# Patient Record
Sex: Male | Born: 1950
Health system: Southern US, Community
[De-identification: ages and names within clinical notes are randomized; demographics above are authoritative.]

## PROBLEM LIST (undated history)

## (undated) DIAGNOSIS — I639 Cerebral infarction, unspecified: Secondary | ICD-10-CM

## (undated) DIAGNOSIS — I10 Essential (primary) hypertension: Secondary | ICD-10-CM

## (undated) DIAGNOSIS — M1990 Unspecified osteoarthritis, unspecified site: Secondary | ICD-10-CM

## (undated) DIAGNOSIS — E785 Hyperlipidemia, unspecified: Secondary | ICD-10-CM

## (undated) HISTORY — DX: Unspecified osteoarthritis, unspecified site: M19.90

## (undated) HISTORY — DX: Cerebral infarction, unspecified: I63.9

## (undated) HISTORY — DX: Essential (primary) hypertension: I10

---

## 1974-03-31 HISTORY — PX: KNEE SURGERY: SHX244

## 1978-05-01 HISTORY — PX: KNEE SURGERY: SHX244

## 1994-03-31 DIAGNOSIS — G459 Transient cerebral ischemic attack, unspecified: Secondary | ICD-10-CM

## 1994-03-31 DIAGNOSIS — I639 Cerebral infarction, unspecified: Secondary | ICD-10-CM

## 1994-03-31 HISTORY — DX: Transient cerebral ischemic attack, unspecified: G45.9

## 1994-03-31 HISTORY — DX: Cerebral infarction, unspecified: I63.9

## 1994-03-31 HISTORY — PX: REPLACEMENT TOTAL KNEE: SUR1224

## 2006-01-19 ENCOUNTER — Emergency Department (HOSPITAL_COMMUNITY): Admission: EM | Admit: 2006-01-19 | Discharge: 2006-01-19 | Payer: Self-pay | Admitting: Emergency Medicine

## 2012-11-10 ENCOUNTER — Ambulatory Visit (INDEPENDENT_AMBULATORY_CARE_PROVIDER_SITE_OTHER): Payer: Medicare HMO | Admitting: Cardiovascular Disease

## 2012-11-10 ENCOUNTER — Encounter: Payer: Self-pay | Admitting: Cardiovascular Disease

## 2012-11-10 VITALS — BP 148/80 | HR 80 | Resp 16 | Ht 70.0 in | Wt 216.4 lb

## 2012-11-10 DIAGNOSIS — E785 Hyperlipidemia, unspecified: Secondary | ICD-10-CM

## 2012-11-10 DIAGNOSIS — I10 Essential (primary) hypertension: Secondary | ICD-10-CM

## 2012-11-10 DIAGNOSIS — M109 Gout, unspecified: Secondary | ICD-10-CM

## 2012-11-10 DIAGNOSIS — Z01810 Encounter for preprocedural cardiovascular examination: Secondary | ICD-10-CM

## 2012-11-10 MED ORDER — AMLODIPINE BESYLATE 5 MG PO TABS: 5.0000 mg | ORAL_TABLET | Freq: Every day | ORAL | Status: DC

## 2012-11-10 NOTE — Patient Instructions (Addendum)
START AMLODIPINE 5MG  ONE TABLET DAILY.  CONTINUE CURRENT MEDICATIONS.  Your physician recommends that you schedule a follow-up appointment in: 2 WEEKS WITH PA.

## 2012-11-21 ENCOUNTER — Encounter: Payer: Self-pay | Admitting: Cardiovascular Disease

## 2012-11-21 DIAGNOSIS — I10 Essential (primary) hypertension: Secondary | ICD-10-CM | POA: Insufficient documentation

## 2012-11-21 DIAGNOSIS — E785 Hyperlipidemia, unspecified: Secondary | ICD-10-CM | POA: Insufficient documentation

## 2012-11-21 DIAGNOSIS — Z01810 Encounter for preprocedural cardiovascular examination: Secondary | ICD-10-CM | POA: Insufficient documentation

## 2012-11-21 DIAGNOSIS — M109 Gout, unspecified: Secondary | ICD-10-CM | POA: Insufficient documentation

## 2012-11-21 NOTE — Assessment & Plan Note (Signed)
On statin therapy. We'll try to get his most recent lipid profile report.

## 2012-11-21 NOTE — Assessment & Plan Note (Signed)
Doubling his losartan/hydrochlorothiazide is unlikely to be in any way beneficial. He is on the maximum effective dose of losartan and increasing his diuretic may be responsible for triggering attacks of gout. I think it'll make more sense to add a separate agent and amlodipine appears to be particularly appropriate. When I mentioned this drug's name he recognized it and stated that he did well when he took it once before, while living in the [place]. We will start a dose of 5 mg once a day and reevaluate his blood pressure in a few weeks.

## 2012-11-21 NOTE — Assessment & Plan Note (Addendum)
The patient's overall cardiovascular risk with the planned knee replacement surgery appears to be low but his blood pressure needs further treatment. It is likely that he has at least some degree of hypertensive cardiomyopathy since his blood pressure has been inconsistently treated over the last 40 years or so.

## 2012-11-21 NOTE — Progress Notes (Signed)
Patient ID: Alexander Gonzalez, male   DOB: 12-Nov-1950, 62 y.o.   MRN: [id number]     Reason for office visit Alexander Gonzalez has insufficiently controlled high blood pressure and is referred in consultation by Dr.Olin, prior to planned total right knee replacement.  He was initially diagnosed with high blood pressure when he was only 62 years old and was interested in playing sports. His compliance with antihypertensive medication has been very spotty. It sounds likely that over the last 40 years his blood pressure has generally not been in the desirable range. He does not have any cardiovascular complaints. He specifically denies syncope, edema, shortness of breath, chest pain, palpitations, intermittent claudication, focal cortical deficits or erectile dysfunction.    No Known Allergies  Current Outpatient Prescriptions  Medication Sig Dispense Refill  . amLODipine (NORVASC) 5 MG tablet Take 1 tablet (5 mg total) by mouth daily.  30 tablet  3  . COLCRYS 0.6 MG tablet Take 0.6 mg by mouth daily as needed.      . indomethacin (INDOCIN) 50 MG capsule Take 50 mg by mouth as needed.      Marland Kitchen losartan-hydrochlorothiazide (HYZAAR) 100-25 MG per tablet Take 1 tablet by mouth daily.      . simvastatin (ZOCOR) 40 MG tablet Take 40 mg by mouth every evening.       No current facility-administered medications for this visit.    Past Medical History  Diagnosis Date  . Hypertension   . Osteoarthritis   . Mini stroke 1996    Past Surgical History  Procedure Laterality Date  . Knee surgery  1976    right  . Replacement total knee  1996    left  . Knee surgery  1980'2    right    Family History  Problem Relation Age of Onset  . Cancer Mother   . Heart attack Father     History   Social History  . Marital Status: Divorced    Spouse Name: N/A    Number of Children: N/A  . Years of Education: N/A   Occupational History  . Not on file.   Social History Main Topics  . Smoking  status: Never Smoker   . Smokeless tobacco: Not on file  . Alcohol Use: Yes     Comment: occas.  . Drug Use: No  . Sexual Activity: Not on file   Other Topics Concern  . Not on file   Social History Narrative  . No narrative on file    Review of systems: The patient specifically denies any chest pain at rest or with exertion, dyspnea at rest or with exertion, orthopnea, paroxysmal nocturnal dyspnea, syncope, palpitations, focal neurological deficits, intermittent claudication, lower extremity edema, unexplained weight gain, cough, hemoptysis or wheezing.  The patient also denies abdominal pain, nausea, vomiting, dysphagia, diarrhea, constipation, polyuria, polydipsia, dysuria, hematuria, frequency, urgency, abnormal bleeding or bruising, fever, chills, unexpected weight changes, mood swings, change in skin or hair texture, change in voice quality, auditory or visual problems, allergic reactions or rashes, he has lifestyle limiting pain and instability in his right knee  PHYSICAL EXAM BP 148/80  Pulse 80  Resp 16  Ht 5\' 10"  (1.778 m)  Wt 216 lb 6.4 oz (98.158 kg)  BMI 31.05 kg/m2  General: Alert, oriented x3, no distress Head: no evidence of trauma, PERRL, EOMI, no exophtalmos or lid lag, no myxedema, no xanthelasma; normal ears, nose and oropharynx Neck: normal jugular venous pulsations and no hepatojugular reflux; brisk  carotid pulses without delay and no carotid bruits Chest: clear to auscultation, no signs of consolidation by percussion or palpation, normal fremitus, symmetrical and full respiratory excursions Cardiovascular: normal position and quality of the apical impulse, regular rhythm, normal first and second heart sounds, no murmurs, rubs or gallops Abdomen: no tenderness or distention, no masses by palpation, no abnormal pulsatility or arterial bruits, normal bowel sounds, no hepatosplenomegaly Extremities: no clubbing, cyanosis or edema; 2+ radial, ulnar and brachial  pulses bilaterally; 2+ right femoral, posterior tibial and dorsalis pedis pulses; 2+ left femoral, posterior tibial and dorsalis pedis pulses; no subclavian or femoral bruits Neurological: grossly nonfocal   EKG: Sinus rhythm without signs of left ventricular hypertrophy nonspecific T-wave flattening/slight inversion in leads 1 and aVL  Lipid Panel  No results found for this basename: chol, trig, hdl, cholhdl, vldl, ldlcalc    BMET No results found for this basename: na, k, cl, co2, glucose, bun, creatinine, calcium, gfrnonaa, gfraa     ASSESSMENT AND PLAN Preop cardiovascular exam The patient's overall cardiovascular risk with the planned knee replacement surgery appears to be low but his blood pressure needs further treatment. It is likely that he has at least some degree of hypertensive cardiomyopathy since his blood pressure has been inconsistently treated over the last 40 years or so.   HTN (hypertension) Doubling his losartan/hydrochlorothiazide is unlikely to be in any way beneficial. He is on the maximum effective dose of losartan and increasing his diuretic may be responsible for triggering attacks of gout. I think it'll make more sense to add a separate agent and amlodipine appears to be particularly appropriate. When I mentioned this drug's name he recognized it and stated that he did well when he took it once before, while living in the [place]. We will start a dose of 5 mg once a day and reevaluate his blood pressure in a few weeks.   Hyperlipidemia On statin therapy. We'll try to get his most recent lipid profile report.  it will also be helpful to get his most recent renal function panel and electrolytes Orders Placed This Encounter  Procedures  . EKG 12-Lead   Meds ordered this encounter  Medications  . simvastatin (ZOCOR) 40 MG tablet    Sig: Take 40 mg by mouth every evening.  Marland Kitchen DISCONTD: losartan (COZAAR) 25 MG tablet    Sig: Take 25 mg by mouth daily.  Marland Kitchen  losartan-hydrochlorothiazide (HYZAAR) 100-25 MG per tablet    Sig: Take 1 tablet by mouth daily.  . indomethacin (INDOCIN) 50 MG capsule    Sig: Take 50 mg by mouth as needed.  . COLCRYS 0.6 MG tablet    Sig: Take 0.6 mg by mouth daily as needed.  Marland Kitchen amLODipine (NORVASC) 5 MG tablet    Sig: Take 1 tablet (5 mg total) by mouth daily.    Dispense:  30 tablet    Refill:  3    Deanna Boehlke  Thurmon Fair, MD, [facility] [phone] office [phone] pager

## 2012-11-24 ENCOUNTER — Ambulatory Visit (INDEPENDENT_AMBULATORY_CARE_PROVIDER_SITE_OTHER): Payer: Medicare HMO | Admitting: Cardiology

## 2012-11-24 ENCOUNTER — Encounter: Payer: Self-pay | Admitting: Cardiology

## 2012-11-24 VITALS — BP 150/80 | HR 80 | Ht 76.0 in | Wt 271.0 lb

## 2012-11-24 DIAGNOSIS — E785 Hyperlipidemia, unspecified: Secondary | ICD-10-CM

## 2012-11-24 DIAGNOSIS — I10 Essential (primary) hypertension: Secondary | ICD-10-CM

## 2012-11-24 MED ORDER — AMLODIPINE BESYLATE 10 MG PO TABS: 10.0000 mg | ORAL_TABLET | Freq: Every day | ORAL | Status: AC

## 2012-11-24 MED ORDER — SIMVASTATIN 40 MG PO TABS: 20.0000 mg | ORAL_TABLET | Freq: Every evening | ORAL | Status: AC

## 2012-11-24 NOTE — Assessment & Plan Note (Signed)
Not well controlled but slowly coming down.  Will increase norvasc to 10 mg daily, discussed with Dr. Royann Shivers , ok to schedule total knee in 3-4 weeks.  We will see back in 2 weeks to make sure BP stable.

## 2012-11-24 NOTE — Patient Instructions (Addendum)
Increase Norvasc to 10 mg daily, you can take 2 of 5 mg until bottle gone then use 10 mg tabs.  We decreased you zocor to 20 mg daily, due to interaction with norvasc.  Follow up with Dr. Royann Shivers or me- on a day Dr. Royann Shivers is here  in 2 weeks  Schedule your surgery for 3-4 weeks.

## 2012-11-24 NOTE — Progress Notes (Signed)
Patient ID: Alexander Gonzalez, male   DOB: [birth year], 61 y.o.   MRN: [id number]   11/24/2012   PCP: Alexander Garibaldi, NP   Chief Complaint  Patient presents with  . Hypertension    Primary Cardiologist: Alexander Gonzalez  HPI:  Alexander Gonzalez, 62 yr old male, has insufficiently controlled high blood pressure and was referred in consultation by Alexander Gonzalez, prior to planned total right knee replacement.    He was initially diagnosed with high blood pressure when he was only 62 years old and was interested in playing sports. His compliance with antihypertensive medication has been very spotty. It sounds likely that over the last 40 years his blood pressure has generally not been in the desirable range. He does not have any cardiovascular complaints. He specifically denies syncope, edema, shortness of breath, chest pain, palpitations, intermittent claudication, focal cortical deficits or erectile dysfunction.   He was seen by Alexander Gonzalez and norvasc added and today he is here for follow up.  No other complaints, but frustrated at having to wait for surgery.   No Known Allergies  Current Outpatient Prescriptions  Medication Sig Dispense Refill  . amLODipine (NORVASC) 10 MG tablet Take 1 tablet (10 mg total) by mouth daily.  30 tablet  3  . COLCRYS 0.6 MG tablet Take 0.6 mg by mouth daily as needed.      . indomethacin (INDOCIN) 50 MG capsule Take 50 mg by mouth as needed.      Marland Kitchen losartan-hydrochlorothiazide (HYZAAR) 100-25 MG per tablet Take 1 tablet by mouth daily.      . simvastatin (ZOCOR) 40 MG tablet Take 0.5 tablets (20 mg total) by mouth every evening. Take 20 mg -half a tablet daily  30 tablet  3   No current facility-administered medications for this visit.    Past Medical History  Diagnosis Date  . Hypertension   . Osteoarthritis   . Mini stroke 1996    Past Surgical History  Procedure Laterality Date  . Knee surgery  1976    right  . Replacement total knee  1996   left  . Knee surgery  1980'2    right    WGN:FAOZHYQ:MV colds or fevers, no weight changes, feels very out of shape Skin:no rashes or ulcers HEENT:no blurred vision, no congestion CV:no chest pain PUL:no SOB GI:no diarrhea constipation or melena, no indigestion GU:no hematuria, no dysuria MS:rt knee joint pain- unable to take pain meds until after surgery, no claudication Neuro:no syncope, no lightheadedness Endo:no diabetes, no thyroid disease  PHYSICAL EXAM BP 150/80  Pulse 80  Ht 6\' 4"  (1.93 m)  Wt 122.925 kg (271 lb)  BMI 33 kg/m2 General:Pleasant affect, NAD HEENT:normocephalic, sclera clear, mucus membranes moist Neck:supple, no JVD, no bruits  Heart:S1S2 RRR without murmur, gallup, rub or click Lungs:clear without rales, rhonchi, or wheezes HQI:ONGE, non tender, + BS, do not palpate liver spleen or masses Neuro:alert and oriented, MAE, follows commands, + facial symmetry   ASSESSMENT AND PLAN HTN (hypertension) Not well controlled but slowly coming down.  Will increase norvasc to 10 mg daily, discussed with Alexander Gonzalez , ok to schedule total knee in 3-4 weeks.  We will see back in 2 weeks to make sure BP stable.  Hyperlipidemia I reduced his zocor with dose of norvasc secondary to interaction.  Now on 20 mg daily. Will need to check lipids in 2-3 months.

## 2012-11-24 NOTE — Assessment & Plan Note (Signed)
I reduced his zocor with dose of norvasc secondary to interaction.  Now on 20 mg daily. Will need to check lipids in 2-3 months.

## 2012-12-08 ENCOUNTER — Encounter: Payer: Self-pay | Admitting: Cardiovascular Disease

## 2012-12-08 ENCOUNTER — Ambulatory Visit (INDEPENDENT_AMBULATORY_CARE_PROVIDER_SITE_OTHER): Payer: Medicare HMO | Admitting: Cardiovascular Disease

## 2012-12-08 VITALS — BP 135/82 | HR 72 | Ht 76.0 in | Wt 273.1 lb

## 2012-12-08 DIAGNOSIS — Z01810 Encounter for preprocedural cardiovascular examination: Secondary | ICD-10-CM

## 2012-12-08 DIAGNOSIS — I10 Essential (primary) hypertension: Secondary | ICD-10-CM

## 2012-12-08 NOTE — Patient Instructions (Addendum)
Your physician recommends that you schedule a follow-up appointment in: One year.  

## 2012-12-08 NOTE — Progress Notes (Signed)
Patient ID: Alexander Gonzalez, male   DOB: Jul 04, 1950, 62 y.o.   MRN: [id number]     Reason for office visit Preoperative evaluation, hypertension  Sven has been taking amlodipine and now has a greatly improved blood pressure. The dose was escalated to 10 mg daily roughly 2 weeks ago. After resting in the office today for about 20 minutes his blood pressure was 135/82 mm Hg. I think the current level of blood pressure control is acceptable. He is at low risk of major cardiac complications with planned upcoming total knee replacement surgery. This has been scheduled for over the sixth he is still taking rather high doses of nonsteroidal anti-inflammatory drugs, which I'm sure not helping his blood pressure. Hopefully these medications can be discontinued or reduced after his surgery.    No Known Allergies  Current Outpatient Prescriptions  Medication Sig Dispense Refill  . amLODipine (NORVASC) 10 MG tablet Take 1 tablet (10 mg total) by mouth daily.  30 tablet  3  . COLCRYS 0.6 MG tablet Take 0.6 mg by mouth daily as needed.      . indomethacin (INDOCIN) 50 MG capsule Take 50 mg by mouth as needed.      Marland Kitchen losartan-hydrochlorothiazide (HYZAAR) 100-25 MG per tablet Take 1 tablet by mouth daily.      . simvastatin (ZOCOR) 40 MG tablet Take 0.5 tablets (20 mg total) by mouth every evening. Take 20 mg -half a tablet daily  30 tablet  3   No current facility-administered medications for this visit.    Past Medical History  Diagnosis Date  . Hypertension   . Osteoarthritis   . Mini stroke 1996    Past Surgical History  Procedure Laterality Date  . Knee surgery  1976    right  . Replacement total knee  1996    left  . Knee surgery  1980'2    right    Family History  Problem Relation Age of Onset  . Cancer Mother   . Heart attack Father     History   Social History  . Marital Status: Divorced    Spouse Name: N/A    Number of Children: N/A  . Years of Education: N/A    Occupational History  . Not on file.   Social History Main Topics  . Smoking status: Never Smoker   . Smokeless tobacco: Not on file  . Alcohol Use: Yes     Comment: occas.  . Drug Use: No  . Sexual Activity: Not on file   Other Topics Concern  . Not on file   Social History Narrative  . No narrative on file    Review of systems: Complains of severe right knee pain The patient specifically denies any chest pain at rest or with exertion, dyspnea at rest or with exertion, orthopnea, paroxysmal nocturnal dyspnea, syncope, palpitations, focal neurological deficits, intermittent claudication, lower extremity edema, unexplained weight gain, cough, hemoptysis or wheezing.  The patient also denies abdominal pain, nausea, vomiting, dysphagia, diarrhea, constipation, polyuria, polydipsia, dysuria, hematuria, frequency, urgency, abnormal bleeding or bruising, fever, chills, unexpected weight changes, mood swings, change in skin or hair texture, change in voice quality, auditory or visual problems, allergic reactions or rashes, new musculoskeletal complaints   PHYSICAL EXAM BP 135/82  Pulse 72  Ht 6\' 4"  (1.93 m)  Wt 273 lb 1.6 oz (123.877 kg)  BMI 33.26 kg/m2 General: Alert, oriented x3, no distress  Head: no evidence of trauma, PERRL, EOMI, no exophtalmos or lid lag,  no myxedema, no xanthelasma; normal ears, nose and oropharynx  Neck: normal jugular venous pulsations and no hepatojugular reflux; brisk carotid pulses without delay and no carotid bruits  Chest: clear to auscultation, no signs of consolidation by percussion or palpation, normal fremitus, symmetrical and full respiratory excursions  Cardiovascular: normal position and quality of the apical impulse, regular rhythm, normal first and second heart sounds, no murmurs, rubs or gallops  Abdomen: no tenderness or distention, no masses by palpation, no abnormal pulsatility or arterial bruits, normal bowel sounds, no  hepatosplenomegaly  Extremities: no clubbing, cyanosis or edema; 2+ radial, ulnar and brachial pulses bilaterally; 2+ right femoral, posterior tibial and dorsalis pedis pulses; 2+ left femoral, posterior tibial and dorsalis pedis pulses; no subclavian or femoral bruits  Neurological: grossly nonfocal   EKG: Times rhythm, nonspecific T-wave flattening in the lateral leads  ASSESSMENT AND PLAN HTN (hypertension) Are as better controlled. He is now on a maximum dose of amlodipine, losartan and the highest dose I would use of a diuretic since he also has gout. If his blood pressure needs further treatment in the future, logical interventions might be switching to a more potent angiotensin receptor blocker (such as irbesartan or Benicar) and/or adding a low-dose of beta blocker .   Preop cardiovascular exam Low risk of major cardiac complications with the planned knee replacement surgery, needs usual DVT prophylaxis  Karlo Goeden  Thurmon Fair, MD, [facility] [phone] office [phone] pager

## 2012-12-08 NOTE — Assessment & Plan Note (Signed)
Are as better controlled. He is now on a maximum dose of amlodipine, losartan and the highest dose I would use of a diuretic since he also has gout. If his blood pressure needs further treatment in the future, logical interventions might be switching to a more potent angiotensin receptor blocker (such as irbesartan or Benicar) and/or adding a low-dose of beta blocker .

## 2012-12-08 NOTE — Assessment & Plan Note (Signed)
Low risk of major cardiac complications with the planned knee replacement surgery, needs usual DVT prophylaxis

## 2013-01-03 ENCOUNTER — Inpatient Hospital Stay (HOSPITAL_COMMUNITY): Admission: RE | Admit: 2013-01-03 | Payer: Medicare HMO | Source: Ambulatory Visit | Admitting: Orthopedic Surgery

## 2013-01-03 ENCOUNTER — Encounter (HOSPITAL_COMMUNITY): Admission: RE | Payer: Self-pay | Source: Ambulatory Visit

## 2013-01-03 SURGERY — ARTHROPLASTY, KNEE, TOTAL
Anesthesia: Spinal | Site: Knee | Laterality: Right

## 2015-01-14 ENCOUNTER — Observation Stay (HOSPITAL_COMMUNITY)
Admission: EM | Admit: 2015-01-14 | Discharge: 2015-01-16 | Disposition: A | Discharge status: Home / Self Care | Payer: Managed Care, Other (non HMO) | Attending: Family Medicine | Admitting: Family Medicine

## 2015-01-14 ENCOUNTER — Emergency Department (HOSPITAL_COMMUNITY): Payer: Managed Care, Other (non HMO)

## 2015-01-14 ENCOUNTER — Encounter (HOSPITAL_COMMUNITY): Payer: Self-pay | Admitting: Nurse Practitioner

## 2015-01-14 DIAGNOSIS — Z87891 Personal history of nicotine dependence: Secondary | ICD-10-CM | POA: Insufficient documentation

## 2015-01-14 DIAGNOSIS — R42 Dizziness and giddiness: Secondary | ICD-10-CM | POA: Insufficient documentation

## 2015-01-14 DIAGNOSIS — Z8673 Personal history of transient ischemic attack (TIA), and cerebral infarction without residual deficits: Secondary | ICD-10-CM | POA: Diagnosis not present

## 2015-01-14 DIAGNOSIS — I679 Cerebrovascular disease, unspecified: Secondary | ICD-10-CM | POA: Diagnosis not present

## 2015-01-14 DIAGNOSIS — R51 Headache: Secondary | ICD-10-CM | POA: Diagnosis not present

## 2015-01-14 DIAGNOSIS — R110 Nausea: Secondary | ICD-10-CM | POA: Diagnosis present

## 2015-01-14 DIAGNOSIS — Z9114 Patient's other noncompliance with medication regimen: Secondary | ICD-10-CM | POA: Insufficient documentation

## 2015-01-14 DIAGNOSIS — M109 Gout, unspecified: Secondary | ICD-10-CM | POA: Diagnosis not present

## 2015-01-14 DIAGNOSIS — I10 Essential (primary) hypertension: Secondary | ICD-10-CM

## 2015-01-14 DIAGNOSIS — R269 Unspecified abnormalities of gait and mobility: Secondary | ICD-10-CM | POA: Insufficient documentation

## 2015-01-14 DIAGNOSIS — I160 Hypertensive urgency: Secondary | ICD-10-CM | POA: Diagnosis not present

## 2015-01-14 DIAGNOSIS — M10032 Idiopathic gout, left wrist: Secondary | ICD-10-CM | POA: Diagnosis not present

## 2015-01-14 DIAGNOSIS — Z8249 Family history of ischemic heart disease and other diseases of the circulatory system: Secondary | ICD-10-CM | POA: Insufficient documentation

## 2015-01-14 DIAGNOSIS — E785 Hyperlipidemia, unspecified: Secondary | ICD-10-CM | POA: Diagnosis not present

## 2015-01-14 HISTORY — DX: Hyperlipidemia, unspecified: E78.5

## 2015-01-14 LAB — URINALYSIS, ROUTINE W REFLEX MICROSCOPIC
Glucose, UA: NEGATIVE mg/dL
Hgb urine dipstick: NEGATIVE
Ketones, ur: 15 mg/dL — AB
Leukocytes, UA: NEGATIVE
Nitrite: NEGATIVE
Protein, ur: 30 mg/dL — AB
Specific Gravity, Urine: 1.019 (ref 1.005–1.030)
Urobilinogen, UA: 1 mg/dL (ref 0.0–1.0)
pH: 5.5 (ref 5.0–8.0)

## 2015-01-14 LAB — COMPREHENSIVE METABOLIC PANEL
ALBUMIN: 4.2 g/dL (ref 3.5–5.0)
ALT: 18 U/L (ref 17–63)
AST: 24 U/L (ref 15–41)
Alkaline Phosphatase: 63 U/L (ref 38–126)
Anion gap: 14 (ref 5–15)
BUN: 14 mg/dL (ref 6–20)
CHLORIDE: 98 mmol/L — AB (ref 101–111)
CO2: 23 mmol/L (ref 22–32)
CREATININE: 1.5 mg/dL — AB (ref 0.61–1.24)
Calcium: 9.8 mg/dL (ref 8.9–10.3)
GFR calc non Af Amer: 47 mL/min — ABNORMAL LOW (ref >60)
GFR, EST AFRICAN AMERICAN: 55 mL/min — AB (ref >60)
GLUCOSE: 122 mg/dL — AB (ref 65–99)
Potassium: 3.8 mmol/L (ref 3.5–5.1)
SODIUM: 135 mmol/L (ref 135–145)
Total Bilirubin: 1.2 mg/dL (ref 0.3–1.2)
Total Protein: 8.7 g/dL — ABNORMAL HIGH (ref 6.5–8.1)

## 2015-01-14 LAB — PROTEIN / CREATININE RATIO, URINE
Creatinine, Urine: 277.75 mg/dL
Protein Creatinine Ratio: 0.21 mg/mg{Cre} — ABNORMAL HIGH (ref 0.00–0.15)
Total Protein, Urine: 59 mg/dL

## 2015-01-14 LAB — I-STAT CHEM 8, ED
BUN: 19 mg/dL (ref 6–20)
CREATININE: 1.5 mg/dL — AB (ref 0.61–1.24)
Calcium, Ion: 1.15 mmol/L (ref 1.13–1.30)
Chloride: 102 mmol/L (ref 101–111)
Glucose, Bld: 124 mg/dL — ABNORMAL HIGH (ref 65–99)
HEMATOCRIT: 55 % — AB (ref 39.0–52.0)
HEMOGLOBIN: 18.7 g/dL — AB (ref 13.0–17.0)
POTASSIUM: 3.8 mmol/L (ref 3.5–5.1)
Sodium: 140 mmol/L (ref 135–145)
TCO2: 22 mmol/L (ref 0–100)

## 2015-01-14 LAB — DIFFERENTIAL
BASOS ABS: 0 10*3/uL (ref 0.0–0.1)
BASOS PCT: 1 %
EOS ABS: 0.1 10*3/uL (ref 0.0–0.7)
Eosinophils Relative: 2 %
LYMPHS PCT: 31 %
Lymphs Abs: 1.6 10*3/uL (ref 0.7–4.0)
Monocytes Absolute: 0.3 10*3/uL (ref 0.1–1.0)
Monocytes Relative: 7 %
NEUTROS PCT: 59 %
Neutro Abs: 3.1 10*3/uL (ref 1.7–7.7)

## 2015-01-14 LAB — URINE MICROSCOPIC-ADD ON

## 2015-01-14 LAB — CBC
HEMATOCRIT: 49.8 % (ref 39.0–52.0)
Hemoglobin: 16.7 g/dL (ref 13.0–17.0)
MCH: 28.7 pg (ref 26.0–34.0)
MCHC: 33.5 g/dL (ref 30.0–36.0)
MCV: 85.6 fL (ref 78.0–100.0)
Platelets: 226 10*3/uL (ref 150–400)
RBC: 5.82 MIL/uL — ABNORMAL HIGH (ref 4.22–5.81)
RDW: 13.3 % (ref 11.5–15.5)
WBC: 5.2 10*3/uL (ref 4.0–10.5)

## 2015-01-14 LAB — I-STAT TROPONIN, ED: Troponin i, poc: 0.01 ng/mL (ref 0.00–0.08)

## 2015-01-14 LAB — APTT: APTT: 33 s (ref 24–37)

## 2015-01-14 LAB — CREATININE, URINE, RANDOM: Creatinine, Urine: 275.89 mg/dL

## 2015-01-14 LAB — PROTIME-INR
INR: 1.15 (ref 0.00–1.49)
Prothrombin Time: 14.9 seconds (ref 11.6–15.2)

## 2015-01-14 LAB — SODIUM, URINE, RANDOM: Sodium, Ur: 93 mmol/L

## 2015-01-14 LAB — CBG MONITORING, ED: Glucose-Capillary: 116 mg/dL — ABNORMAL HIGH (ref 65–99)

## 2015-01-14 MED ORDER — DIAZEPAM 5 MG/ML IJ SOLN
5.0000 mg | Freq: Once | INTRAMUSCULAR | Status: AC
  Administered 2015-01-14: 5 mg via INTRAVENOUS
  Filled 2015-01-14: qty 2

## 2015-01-14 MED ORDER — ACETAMINOPHEN 500 MG PO TABS: 1000.0000 mg | ORAL_TABLET | Freq: Four times a day (QID) | ORAL | Status: DC | PRN

## 2015-01-14 MED ORDER — SODIUM CHLORIDE 0.9 % IV BOLUS (SEPSIS)
1000.0000 mL | Freq: Once | INTRAVENOUS | Status: AC
  Administered 2015-01-14: 1000 mL via INTRAVENOUS

## 2015-01-14 MED ORDER — AMLODIPINE BESYLATE 10 MG PO TABS
10.0000 mg | ORAL_TABLET | Freq: Every day | ORAL | Status: DC
  Administered 2015-01-15 – 2015-01-16 (×2): 10 mg via ORAL
  Filled 2015-01-14 (×2): qty 1

## 2015-01-14 MED ORDER — ASPIRIN EC 81 MG PO TBEC
81.0000 mg | DELAYED_RELEASE_TABLET | Freq: Every day | ORAL | Status: DC
  Administered 2015-01-14 – 2015-01-16 (×3): 81 mg via ORAL
  Filled 2015-01-14 (×3): qty 1

## 2015-01-14 MED ORDER — COLCHICINE 0.6 MG PO TABS
1.2000 mg | ORAL_TABLET | Freq: Once | ORAL | Status: AC
  Administered 2015-01-14: 1.2 mg via ORAL
  Filled 2015-01-14: qty 2

## 2015-01-14 MED ORDER — PROMETHAZINE HCL 25 MG PO TABS: 12.5000 mg | ORAL_TABLET | Freq: Four times a day (QID) | ORAL | Status: DC | PRN

## 2015-01-14 MED ORDER — COLCHICINE 0.6 MG PO TABS
0.6000 mg | ORAL_TABLET | Freq: Once | ORAL | Status: AC
  Administered 2015-01-14: 0.6 mg via ORAL
  Filled 2015-01-14: qty 1

## 2015-01-14 MED ORDER — LABETALOL HCL 200 MG PO TABS
200.0000 mg | ORAL_TABLET | Freq: Once | ORAL | Status: AC
  Administered 2015-01-14: 200 mg via ORAL
  Filled 2015-01-14: qty 1

## 2015-01-14 MED ORDER — HYDROCODONE-ACETAMINOPHEN 5-325 MG PO TABS
2.0000 | ORAL_TABLET | Freq: Once | ORAL | Status: AC
  Administered 2015-01-14: 2 via ORAL
  Filled 2015-01-14: qty 2

## 2015-01-14 MED ORDER — ONDANSETRON HCL 4 MG/2ML IJ SOLN
4.0000 mg | Freq: Once | INTRAMUSCULAR | Status: AC
  Administered 2015-01-14: 4 mg via INTRAVENOUS
  Filled 2015-01-14: qty 2

## 2015-01-14 MED ORDER — HYDROCODONE-ACETAMINOPHEN 5-325 MG PO TABS: 1.0000 | ORAL_TABLET | Freq: Four times a day (QID) | ORAL | Status: DC | PRN

## 2015-01-14 MED ORDER — SIMVASTATIN 20 MG PO TABS
20.0000 mg | ORAL_TABLET | Freq: Every evening | ORAL | Status: DC
  Administered 2015-01-14 – 2015-01-15 (×2): 20 mg via ORAL
  Filled 2015-01-14 (×2): qty 1

## 2015-01-14 NOTE — ED Notes (Signed)
Attempted report 

## 2015-01-14 NOTE — ED Notes (Signed)
Pt placed in gown and in bed. Pt monitored by pulse ox, bp cuff, and 12-lead. 

## 2015-01-14 NOTE — ED Notes (Signed)
Pt returned from MRI °

## 2015-01-14 NOTE — ED Notes (Signed)
CBG = 116  Melissa RN informed of result.

## 2015-01-14 NOTE — ED Notes (Signed)
Per [name], EMT- Pt unable to ambulate safely.

## 2015-01-14 NOTE — ED Provider Notes (Addendum)
CSN: [id number]     Arrival date & time 01/14/15  1215 History   First MD Initiated Contact with Patient 01/14/15 1252     Chief Complaint  Patient presents with  . Headache  . Dizziness     (Consider location/radiation/quality/duration/timing/severity/associated sxs/prior Treatment) HPI Comments: Patient is a 64 year old male with history of hypertension with noncompliance. He presents with headache and elevated blood pressure. He states that he has off of his blood pressure medications for the past 2 months. He denies any chest pain or shortness of breath. He denies any fevers or chills.  Patient is a 64 y.o. male presenting with headaches. The history is provided by the patient.  Headache Pain location:  Generalized Quality:  Dull Onset quality:  Sudden Duration:  3 days Timing:  Constant Progression:  Worsening Chronicity:  New Similar to prior headaches: no   Context: activity   Relieved by:  Nothing Worsened by:  Nothing Ineffective treatments:  None tried   Past Medical History  Diagnosis Date  . Hypertension   . Osteoarthritis   . Mini stroke (HCC) 1996  . Hyperlipidemia    Past Surgical History  Procedure Laterality Date  . Knee surgery  1976    right  . Replacement total knee  1996    left  . Knee surgery  1980'2    right   Family History  Problem Relation Age of Onset  . Cancer Mother   . Heart attack Father    Social History  Substance Use Topics  . Smoking status: Never Smoker   . Smokeless tobacco: None  . Alcohol Use: Yes     Comment: occas.    Review of Systems  Neurological: Positive for headaches.  All other systems reviewed and are negative.     Allergies  Review of patient's allergies indicates no known allergies.  Home Medications   Prior to Admission medications   Medication Sig Start Date End Date Taking? Authorizing Provider  amLODipine (NORVASC) 10 MG tablet Take 1 tablet (10 mg total) by mouth daily. 11/24/12  Yes Leone BrandLaura  R Ingold, NP  COLCRYS 0.6 MG tablet Take 0.6 mg by mouth daily as needed (gout).  09/30/12  Yes Historical Provider, MD  indomethacin (INDOCIN) 50 MG capsule Take 50 mg by mouth as needed for mild pain.  09/30/12  Yes Historical Provider, MD  losartan-hydrochlorothiazide (HYZAAR) 100-25 MG per tablet Take 1 tablet by mouth daily. 09/30/12  Yes Historical Provider, MD  simvastatin (ZOCOR) 40 MG tablet Take 0.5 tablets (20 mg total) by mouth every evening. Take 20 mg -half a tablet daily 11/24/12  Yes Leone BrandLaura R Ingold, NP   BP 173/99 mmHg  Pulse 102  Temp(Src) 98 F (36.7 C) (Oral)  Resp 16  SpO2 100% Physical Exam  Constitutional: He is oriented to person, place, and time. He appears well-developed and well-nourished. No distress.  HENT:  Head: Normocephalic and atraumatic.  Mouth/Throat: Oropharynx is clear and moist.  Eyes: EOM are normal. Pupils are equal, round, and reactive to light.  There is no papilledema on funduscopic exam.  Neck: Normal range of motion. Neck supple.  Cardiovascular: Normal rate, regular rhythm and normal heart sounds.   No murmur heard. Pulmonary/Chest: Effort normal and breath sounds normal. No respiratory distress. He has no wheezes.  Abdominal: Soft. Bowel sounds are normal. He exhibits no distension. There is no tenderness.  Musculoskeletal: Normal range of motion. He exhibits no edema.  Lymphadenopathy:    He has no cervical  adenopathy.  Neurological: He is alert and oriented to person, place, and time. No cranial nerve deficit. He exhibits normal muscle tone. Coordination normal.  Skin: Skin is warm and dry. He is not diaphoretic.  Nursing note and vitals reviewed.   ED Course  Procedures (including critical care time) Labs Review Labs Reviewed  CBC - Abnormal; Notable for the following:    RBC 5.82 (*)    All other components within normal limits  CBG MONITORING, ED - Abnormal; Notable for the following:    Glucose-Capillary 116 (*)    All other  components within normal limits  I-STAT CHEM 8, ED - Abnormal; Notable for the following:    Creatinine, Ser 1.50 (*)    Glucose, Bld 124 (*)    Hemoglobin 18.7 (*)    HCT 55.0 (*)    All other components within normal limits  DIFFERENTIAL  PROTIME-INR  APTT  COMPREHENSIVE METABOLIC PANEL  I-STAT TROPOININ, ED    Imaging Review No results found. I have personally reviewed and evaluated these images and lab results as part of my medical decision-making.   EKG Interpretation   Date/Time:  Sunday January 14 2015 12:40:41 EDT Ventricular Rate:  97 PR Interval:  140 QRS Duration: 89 QT Interval:  343 QTC Calculation: 436 R Axis:   0 Text Interpretation:  Sinus rhythm Abnormal T, consider ischemia, lateral  leads Baseline wander in lead(s) V2 Confirmed by Kennadee Walthour  MD, Fern Asmar ([id number])  on 01/14/2015 12:59:53 PM      MDM   Final diagnoses:  None    Patient presents here with complaints of elevated blood pressure and headache. His initial blood pressures here were markedly elevated over 200 systolic. He was given by mouth labetalol and his blood pressures have since significantly improved. CT scan reveals no acute process and laboratory studies reveal no evidence for endorgan damage. I see nothing emergent that requires further intervention at this time. He will be treated with his home blood pressure medications, pain medication for his headache, and advised to follow-up with his primary Dr. for recheck this week.    Geoffery Lyons, MD 01/14/15 1525   While attempting to ambulate the patient prior to discharge. He became acutely dizzy and stated that the room was spinning. He became nauseated and complained of worsening headache. He had received 2 Vicodin approximately 15 minutes prior to this. Due to his worsening condition, he will undergo an MRI of the brain to further rule out stroke.  Geoffery Lyons, MD 01/14/15 [phone]

## 2015-01-14 NOTE — ED Provider Notes (Addendum)
Care was taken over from Dr. Judd Lienelo. Patient with a history of hypertension who is noncompliant with his medications presents with headache associated with vertiginous-type symptoms and elevated blood pressure. He was felt to have a hypertensive urgency. He was given a dose of oral labetalol with improvement of his blood pressure. He still had vertiginous-type symptoms with associated ataxia. An MRI was performed which does not show evidence of acute infarct. There is some evidence of possible partial occlusion versus slow flow of the vertebral artery. Patient is feeling better however when he tries to ambulate he still has severe vertiginous symptoms with associated vomiting. Given this, I will plan on admission. [facility]. I will consult unassigned medicine.  I spoke with Dr. Jodi Mourningeynold with neurology who does not feel that pt needs inpatient stroke evaluation given that MR did not show an acute infarct. She advised that the only intervention to do about the vertebral artery abnormality is to place the patient on a daily aspirin and have better blood pressure management.  I discussed the case with Dr. Maryfrances Bunnellanford who will admit the patient. He advised a MedSurg bed if we are not doing a further stroke evaluation. I will admit him to a MedSurg bed for observation.  Rolan BuccoMelanie Everlie Eble, MD 01/14/15 [phone]  Rolan BuccoMelanie Desarea Ohagan, MD 01/14/15 2019

## 2015-01-14 NOTE — H&P (Signed)
History and Physical  Patient Name: Alexander Gonzalez     WJX:[id number]    DOB: Oct 01, 1950    DOA: 01/14/2015 Referring physician: Emily Filbert, MD PCP: Egbert Garibaldi, NP      Chief Complaint: Headache and hypertension  HPI: Alexander Gonzalez is a 64 y.o. male with a past medical history significant for TIA, HTN and gout who presents with severe headache.  The patient was in his usual state of health until 3 days ago when a severe headache that was associated with nausea and was worse with movement. This was similar in character to previous headaches that he has had about every other month for years (which usually resolve with taking his blood pressure medicines again). The patient usually takes his blood pressure medicines for a week or 2 after one of these headaches and then stops for until the next headache.  This time the headache was more intense, and did not resolve after a day like they usually do. He felt vertigo with lying flat on his back, and the headaches were more severe and made him dizzy when he stood up. There was no vomiting, syncope, focal weakness, speech difficulties, confusion, chest discomfort.  In the ED, the patient had slightly elevated creatinine without known baseline, normal potassium, ECG similar to previous. An initial troponin was negative. CT of the head was unremarkable. An MRI of the head was ordered which showed no acute infarct and posterior circulation atherosclerotic disease. TRH was asked to admit for observation and for hypertensive urgency.     Review of Systems:  Patient seen 8:07 PM on 01/14/2015. All other systems negative except as just noted or noted in the history of present illness.  No Known Allergies  Prior to Admission medications   Medication Sig Start Date End Date Taking? Authorizing Provider  amLODipine (NORVASC) 10 MG tablet Take 1 tablet (10 mg total) by mouth daily. 11/24/12  Yes Leone Brand, NP  COLCRYS 0.6 MG tablet Take  0.6 mg by mouth daily as needed (gout).  09/30/12  Yes Historical Provider, MD  losartan-hydrochlorothiazide (HYZAAR) 100-25 MG per tablet Take 1 tablet by mouth daily. 09/30/12  Yes Historical Provider, MD  simvastatin (ZOCOR) 40 MG tablet Take 0.5 tablets (20 mg total) by mouth every evening. Take 20 mg -half a tablet daily 11/24/12  Yes Leone Brand, NP  HYDROcodone-acetaminophen (NORCO) 5-325 MG tablet Take 1-2 tablets by mouth every 6 (six) hours as needed. 01/14/15   Geoffery Lyons, MD  The patient hasn't taken these medicines in about 2 months.  Past Medical History  Diagnosis Date  . Hypertension   . Osteoarthritis   . Mini stroke (HCC) 1996  . Hyperlipidemia     Past Surgical History  Procedure Laterality Date  . Knee surgery  1976    right  . Replacement total knee  1996    left  . Knee surgery  [phone]    right    Family history: family history includes Cancer in his mother; Heart attack in his father; Hypertension in his mother.  Social History: Patient lives with his wife and 4 children. He is from [place] originally. He has a brief very remote history of smoking. He drinks red wine, but not in the last few weeks. He suggests that he had a history of heavier drinking in the past.       Physical Exam: BP 140/80 mmHg  Pulse 71  Temp(Src) 98 F (36.7 C) (Oral)  Resp 10  SpO2 95% General appearance: Well-developed, adult male, alert and in moderate distress from pain.   Eyes: Anicteric, conjunctiva pink, lids and lashes normal.     ENT: No nasal deformity, discharge, or epistaxis.  OP moist without lesions.  Poor dentition. Lymph: No cervical, supraclavicular or axillary lymphadenopathy. Skin: Warm and dry.  No jaundice.  No suspicious rashes or lesions. Cardiac: RRR, nl S1-S2, no murmurs appreciated.  Capillary refill is brisk.  JVP normal.  No LE edema.  Radial and DP pulses 2+ and symmetric. Respiratory: Normal respiratory rate and rhythm.  CTAB without rales or  wheezes. Abdomen: Abdomen soft without rigidity.  No TTP. No ascites, distension.   MSK: No deformities or effusions.  There is tenderness to palpation of the left wrist, without swelling or tophi visible on any extremities. Neuro: Pupils are 3 mm and reactive to 2 mm.  Extraocular movements are intact, without nystagmus.    Cranial nerve 5 is within normal limits.  Cranial nerve 7 is symmetrical.  Cranial nerve 8 is within normal limits.  Cranial nerves 9 and 10 reveal equal palate elevation.  Cranial nerve 11 reveals sternocleidomastoid strong.  Cranial nerve 12 is midline.  Motor strength testing is 5/5 in the upper and lower extremities bilaterally with normal motor, tone and bulk.   Sensory examination is intact to light touch and position.  There is no pronator drift.  Finger-to-nose testing is within normal limits.  The patient is oriented to time, place and person.  Speech is fluent.  Naming is grossly intact.  Recall, recent and remote, as well as general fund of knowledge seem within normal limits.  Attention span and concentration are within normal limits. Psych: Behavior appropriate.  Affect normal.  No evidence of aural or visual hallucinations or delusions.       Labs on Admission:  The metabolic panel shows elevated creatinine 1.5 mg/dL without known baseline and normal potassium. The transaminases and bilirubin are normal. The complete blood count shows normal WBC, hemoglobin, and platelet count. The troponin is negative. The INR is normal.   Radiological Exams on Admission: Ct Head Wo Contrast 01/14/2015 IMPRESSION: No acute abnormality    Mr Brain Wo Contrast 01/14/2015 Asymmetric loss of flow related hypointensity within the left vertebral artery could be from slow flow or partial occlusion (there is evidence of flow at the level of the PICA). Chronicity is uncertain, but no acute infarct. This is presumably atherosclerotic given calcification within the  vertebrobasilar system on preceding head CT. No T1 signal suggestive of intramural hematoma. Patchy FLAIR hyperintensity in the bilateral cerebral white matter, mild small vessel ischemic change for age in this patient history of hypertension and hyperlipidemia.     EKG: Independently reviewed. NSR with stable old lateral TW changes.    Assessment/Plan  1. Hypertensive urgency:  This is new.  Related to medication nonadherence.  Labetalol oral given in the ER. -Continue home amlodipine -Restart losartan HCTZ after morning BMP -Social work consult for obtaining a month's worth supply of medications as patient find a new PCP at [facility]   2. Headache and nausea:  This is new.  No MRI findings to suggest hemorrhage or mass. -Acetaminophen 1000 mg  3. Gout:  The patient has been out of his colchicine. We'll hold NSAIDs given creatinine. -Colchicine 1.2 mg now followed by 0.6 mg 2 hours later  4. Elevated creatinine:  Chronicity unclear. -Fluid resuscitation with 1 L normal saline -Urine electrolytes -Repeat BMP tomorrow  5. New finding  of cerebrovascular disease on MRI:  This is new. This was discussed with Dr. Thad Ranger of neurology. There are no MRI findings to suggest acute thrombus, which correlates with the clinical exam. No follow-up is needed other than risk reduction -Start aspirin daily -Continue simvastatin -Fasting lipids, hemoglobin A1c      DVT PPx: Low risk Diet: Heart healthy Code Status: Full Family Communication: Wife  Medical decision making: What exists of the patient's previous chart was reviewed in depth and the case was discussed with Dr. Thad Ranger.  At the time of my evaluation, the patient does not have signs of hemodynamic compromise or end-organ dysfunction and is stable for a medical-surgical bed.   Disposition Plan:  Restart home BP meds and statin.  Start aspirin.  See CM for med mgmt.  Recheck kidneys tomorrow, and again after discharge.  At the  point of initial evaluation, it is my clinical opinion that admission for OBSERVATION is reasonable and necessary because the patient's presenting complaints in the context of their chronic conditions represent sufficient risk of deterioration or significant morbidity to constitute reasonable grounds for close observation in the hospital setting, but that the patient may be medically stable for discharge from the hospital within 24 to 48 hours.        Alberteen Sam [facility] Pager [phone]

## 2015-01-14 NOTE — ED Notes (Addendum)
He reports onset of L hand pain 2 days ago, began radiating up L arm, then into L side of neck adn head. Now hes feeling dizzy, nauseated and wife says he's been "breathing hard" although he denies SOB. He denies cp. He is A&Ox4, speech is clear, grips equal bilaterlly, no arm drift. He says this is how felt when he had a mini stroke

## 2015-01-14 NOTE — ED Notes (Signed)
Pt unable to ambulate more than 5 feet. Pt returned to bed c/o lightheadedness. Pt then began vomiting.

## 2015-01-14 NOTE — Progress Notes (Signed)
Pt arrived to unit alert and oriented. Oriented to room. Call bell at side. Bed alarm activated. Will continue to monitor. Leanora Murin M, RN 

## 2015-01-15 DIAGNOSIS — I160 Hypertensive urgency: Secondary | ICD-10-CM

## 2015-01-15 LAB — BASIC METABOLIC PANEL
ANION GAP: 9 (ref 5–15)
BUN: 21 mg/dL — ABNORMAL HIGH (ref 6–20)
CALCIUM: 9 mg/dL (ref 8.9–10.3)
CO2: 25 mmol/L (ref 22–32)
Chloride: 102 mmol/L (ref 101–111)
Creatinine, Ser: 1.36 mg/dL — ABNORMAL HIGH (ref 0.61–1.24)
GFR, EST NON AFRICAN AMERICAN: 53 mL/min — AB (ref >60)
Glucose, Bld: 97 mg/dL (ref 65–99)
POTASSIUM: 3.8 mmol/L (ref 3.5–5.1)
Sodium: 136 mmol/L (ref 135–145)

## 2015-01-15 LAB — LIPID PANEL
Cholesterol: 199 mg/dL (ref 0–200)
HDL: 32 mg/dL — AB (ref >40)
LDL Cholesterol: 142 mg/dL — ABNORMAL HIGH (ref 0–99)
TRIGLYCERIDES: 123 mg/dL (ref <150)
Total CHOL/HDL Ratio: 6.2 RATIO
VLDL: 25 mg/dL (ref 0–40)

## 2015-01-15 MED ORDER — MECLIZINE HCL 12.5 MG PO TABS
12.5000 mg | ORAL_TABLET | Freq: Two times a day (BID) | ORAL | Status: DC | PRN
  Administered 2015-01-15: 12.5 mg via ORAL
  Filled 2015-01-15: qty 1

## 2015-01-15 NOTE — Care Management Note (Signed)
Case Management Note  Patient Details  Name: Alexander Gonzalez MRN: [id number] Date of Birth: 06/16/1950  Subjective/Objective:                    Action/Plan: Patient was admitted with headache and hypertension. Lives at home with spouse.  CM consult was placed to run a benefits check for Amlodipine, Simvastatin, and Losarton/HCTZ.  Patient is insuranced and benefits check was sent and is currently pending.  CM will continue to follow for any additional discharge needs. Expected Discharge Date:                  Expected Discharge Plan:     In-House Referral:     Discharge planning Services  CM Consult  Post Acute Care Choice:    Choice offered to:     DME Arranged:    DME Agency:     HH Arranged:    HH Agency:     Status of Service:  In process, will continue to follow  Medicare Important Message Given:    Date Medicare IM Given:    Medicare IM give by:    Date Additional Medicare IM Given:    Additional Medicare Important Message give by:     If discussed at Long Length of Stay Meetings, dates discussed:    Additional Comments:  Anda KraftRobarge, Danene Montijo C, RN 01/15/2015, 11:24 AM

## 2015-01-15 NOTE — Progress Notes (Signed)
Benefits Check Results:  Pharmacy coverage is through [employer]:   Amlodipine: $6 30 day retail/ $5 90 day mail order/ no auth required   Simvastatin: $6 30 day supply/ $5 90 day mail order/ no auth required   Losartan/HCTZ: $6 30 day retail/ $5 90 day mail order/ no auth required   Patient can use: Any Major retail pharmacy    Elmer Balesourtney Duquan Gillooly RN, MSN [phone]

## 2015-01-15 NOTE — Progress Notes (Signed)
[facility] PROGRESS NOTE  Alexander Gonzalez ZOX:[id number] DOB: 1950/11/05 DOA: 01/14/2015 PCP: Egbert Garibaldi, NP  Assessment/Plan: Principal Problem:   Hypertensive urgency - Resolved and BP improved. Currently patient is on amlodipine - will continue to monitor  Headache  - most likely due to principle problem - has resolved with improvement in BP control  Active Problems:   Gout - stable    Nausea - resolved  Dizziness - try trial of meclizine - obtain PT evaluation  Code Status: full Family Communication: None at bedside Disposition Plan: Pending evaluation with PT   Consultants:  none  Procedures:  none  Antibiotics:  None  HPI/Subjective: Patient's only complaint today is dizziness. He is lying in bed complaining of dizziness. Otherwise denies any headache  Objective: Filed Vitals:   01/15/15 1405  BP: 159/89  Pulse: 72  Temp: 98.1 F (36.7 C)  Resp: 18    Intake/Output Summary (Last 24 hours) at 01/15/15 1503 Last data filed at 01/15/15 1300  Gross per 24 hour  Intake    360 ml  Output      0 ml  Net    360 ml   Filed Weights   01/14/15 2045  Weight: 118.706 kg (261 lb 11.2 oz)    Exam:   General:  Patient in no acute distress, alert and awake  Cardiovascular: Regular rate and rhythm, no rubs  Respiratory: Clear to auscultation bilaterally, no wheezes  Abdomen: Soft, nontender, nondistended  Musculoskeletal: No cyanosis or clubbing   Data Reviewed: Basic Metabolic Panel:  Recent Labs Lab 01/14/15 1247 01/14/15 1254 01/15/15 0438  NA 135 140 136  K 3.8 3.8 3.8  CL 98* 102 102  CO2 23  --  25  GLUCOSE 122* 124* 97  BUN 14 19 21*  CREATININE 1.50* 1.50* 1.36*  CALCIUM 9.8  --  9.0   Liver Function Tests:  Recent Labs Lab 01/14/15 1247  AST 24  ALT 18  ALKPHOS 63  BILITOT 1.2  PROT 8.7*  ALBUMIN 4.2   No results for input(s): LIPASE, AMYLASE in the last 168 hours. No results for input(s):  AMMONIA in the last 168 hours. CBC:  Recent Labs Lab 01/14/15 1247 01/14/15 1254  WBC 5.2  --   NEUTROABS 3.1  --   HGB 16.7 18.7*  HCT 49.8 55.0*  MCV 85.6  --   PLT 226  --    Cardiac Enzymes: No results for input(s): CKTOTAL, CKMB, CKMBINDEX, TROPONINI in the last 168 hours. BNP (last 3 results) No results for input(s): BNP in the last 8760 hours.  ProBNP (last 3 results) No results for input(s): PROBNP in the last 8760 hours.  CBG:  Recent Labs Lab 01/14/15 1250  GLUCAP 116*    No results found for this or any previous visit (from the past 240 hour(s)).   Studies: Ct Head Wo Contrast  01/14/2015  CLINICAL DATA:  Severe temporal headache. Left arm pain. Dizziness and nausea. EXAM: CT HEAD WITHOUT CONTRAST TECHNIQUE: Contiguous axial images were obtained from the base of the skull through the vertex without intravenous contrast. COMPARISON:  None. FINDINGS: There is cortical atrophy and chronic microvascular ischemic change. No evidence of acute intracranial abnormality including hemorrhage, infarct, mass lesion, mass effect, midline shift or abnormal extra-axial fluid collection is identified. No hydrocephalus or pneumocephalus. Empty sella is noted. Imaged paranasal sinuses and mastoid air cells are clear. The calvarium is intact. IMPRESSION: No acute abnormality. Electronically Signed   By: Drusilla Kanner  M.D.   On: 01/14/2015 15:15   Mr Brain Wo Contrast  01/14/2015  CLINICAL DATA:  Headache EXAM: MRI HEAD WITHOUT CONTRAST TECHNIQUE: Multiplanar, multiecho pulse sequences of the brain and surrounding structures were obtained without intravenous contrast. COMPARISON:  Head CT from earlier today FINDINGS: Calvarium and upper cervical spine: No focal marrow signal abnormality. Orbits: No significant findings. Sinuses and Mastoids: Clear. Brain: No acute abnormality such as infarct, hemorrhage, hydrocephalus, or mass lesion. Asymmetric loss of flow related hypointensity  within the left vertebral artery could be from slow flow or partial occlusion (there is evidence of flow at the level of the PICA). Chronicity is uncertain, but no acute infarct. This is presumably atherosclerotic given calcification within the vertebrobasilar system on preceding head CT. No T1 signal suggestive of intramural hematoma. Patchy FLAIR hyperintensity in the bilateral cerebral white matter, mild small vessel ischemic change for age in this patient history of hypertension and hyperlipidemia. IMPRESSION: 1. No definitive explanation for headache. 2. Abnormal signal in the left vertebral artery, slow flow versus partial occlusion. No associated acute or remote infarct. Electronically Signed   By: Marnee SpringJonathon  Watts M.D.   On: 01/14/2015 17:16    Scheduled Meds: . amLODipine  10 mg Oral Daily  . aspirin EC  81 mg Oral Daily  . simvastatin  20 mg Oral QPM   Continuous Infusions:   Time spent: > 35 minutes  Penny PiaVEGA, Ferrin Liebig  [facility] Pager [phone] If 7PM-7AM, please contact night-coverage at [email or web address], password [email or web address] 01/15/2015, 3:03 PM

## 2015-01-15 NOTE — Evaluation (Signed)
Physical Therapy Evaluation Patient Details Name: Alexander Gonzalez MRN: [id number] DOB: [birth year] Today's Date: 01/15/2015   History of Present Illness  Alexander Gonzalez is a 65 y.o. male with a past medical history significant for TIA, HTN and gout who presents with severe headache and found to be hypertensive in ED.  Clinical Impression  Patient presents with mild symptoms that could indicate positional vertigo and/or unilateral vestibular hypofunction.  Symptoms masked somewhat by medication this date, but feel outpatient vestibular rehab follow up may be indicated to ensure symptoms resolve.  Will follow up if not d/c.    Follow Up Recommendations Outpatient PT    Equipment Recommendations  None recommended by PT    Recommendations for Other Services       Precautions / Restrictions Precautions Precautions: None      Mobility  Bed Mobility Overal bed mobility: Modified Independent                Transfers Overall transfer level: Modified independent                  Ambulation/Gait Ambulation/Gait assistance: Independent Ambulation Distance (Feet): 180 Feet Assistive device: None Gait Pattern/deviations: WFL(Within Functional Limits)     General Gait Details: also able to perform head turns with out change in gait, head nods with slowed gait and mild c/o dizziness  Stairs Stairs: Yes Stairs assistance: Supervision Stair Management: One rail Right;Forwards;Alternating pattern;Step to pattern Number of Stairs: 10 General stair comments: step to for descent initially, then step through with pain.  Wheelchair Mobility    Modified Rankin (Stroke Patients Only)       Balance Overall balance assessment: No apparent balance deficits (not formally assessed)                                           Pertinent Vitals/Pain Pain Assessment: 0-10 Pain Score: 6  Pain Location: left hand gout Pain Descriptors / Indicators:  Aching Pain Intervention(s): Monitored during session    Home Living Family/patient expects to be discharged to:: Private residence Living Arrangements: Spouse/significant other Available Help at Discharge: Family Type of Home: House       Home Layout: Two level Home Equipment: Cane - single point      Prior Function Level of Independence: Independent         Comments: reports some difficutly with stairs due to needing knee replacement on right     Hand Dominance   Dominant Hand: Left    Extremity/Trunk Assessment   Upper Extremity Assessment: Overall WFL for tasks assessed           Lower Extremity Assessment: Overall WFL for tasks assessed         Communication   Communication: No difficulties  Cognition Arousal/Alertness: Awake/alert Behavior During Therapy: WFL for tasks assessed/performed Overall Cognitive Status: Within Functional Limits for tasks assessed                      General Comments General comments (skin integrity, edema, etc.): completed vestibular screen,  Positive refixation with head thrust to right, negative modified hall pike, but some mild rotary nystagmus seen to right with right sidelying; patient reports having taken Meclinzine earlier today.    Exercises        Assessment/Plan    PT Assessment Patient needs continued PT services  PT Diagnosis Abnormality of  gait;Other (comment) (vertigo)   PT Problem List Decreased mobility;Other (comment);Decreased activity tolerance (vertigo)  PT Treatment Interventions DME instruction;Balance training;Gait training;Stair training;Functional mobility training;Patient/family education;Therapeutic activities;Therapeutic exercise;Other (comment) (canalith repositioning)   PT Goals (Current goals can be found in the Care Plan section) Acute Rehab PT Goals Patient Stated Goal: To return home PT Goal Formulation: With patient Time For Goal Achievement: 01/22/15 Potential to Achieve  Goals: Good    Frequency Min 3X/week   Barriers to discharge        Co-evaluation               End of Session Equipment Utilized During Treatment: Gait belt Activity Tolerance: Patient tolerated treatment well Patient left: in bed;with call bell/phone within reach      Functional Assessment Tool Used: Clinical Judgement Functional Limitation: Mobility: Walking and moving around Mobility: Walking and Moving Around Current Status ([id number](G8978): At least 1 percent but less than 20 percent impaired, limited or restricted Mobility: Walking and Moving Around Goal Status [phone](G8979): 0 percent impaired, limited or restricted    Time: 1511-1540 PT Time Calculation (min) (ACUTE ONLY): 29 min   Charges:   PT Evaluation $Initial PT Evaluation Tier I: 1 Procedure PT Treatments $Gait Training: 8-22 mins   PT G Codes:   PT G-Codes **NOT FOR INPATIENT CLASS** Functional Assessment Tool Used: Clinical Judgement Functional Limitation: Mobility: Walking and moving around Mobility: Walking and Moving Around Current Status (W1027(G8978): At least 1 percent but less than 20 percent impaired, limited or restricted Mobility: Walking and Moving Around Goal Status [phone](G8979): 0 percent impaired, limited or restricted    Prohealth Aligned [email or web address],CYNDI 01/15/2015, 3:54 PM  [name], PT [phone] 01/15/2015

## 2015-01-16 DIAGNOSIS — I160 Hypertensive urgency: Secondary | ICD-10-CM | POA: Diagnosis not present

## 2015-01-16 LAB — HEMOGLOBIN A1C
HEMOGLOBIN A1C: 5.9 % — AB (ref 4.8–5.6)
Mean Plasma Glucose: 123 mg/dL

## 2015-01-16 MED ORDER — ASPIRIN 81 MG PO TBEC: 81.0000 mg | DELAYED_RELEASE_TABLET | Freq: Every day | ORAL | Status: DC

## 2015-01-16 MED ORDER — MECLIZINE HCL 12.5 MG PO TABS: 12.5000 mg | ORAL_TABLET | Freq: Two times a day (BID) | ORAL | Status: DC | PRN

## 2015-01-16 NOTE — Progress Notes (Signed)
Physical Therapy Treatment Patient Details Name: Alexander Gonzalez MRN: [id number] DOB: 06/02/1950 Today's Date: 01/16/2015    History of Present Illness Alexander Gonzalez is a 64 y.o. male with a past medical history significant for TIA, HTN and gout who presents with severe headache and found to be hypertensive in ED.    PT Comments    Patient with persistent left rotary nystagmus in left hall pike position.  Performed Liberatory maneuver x 2 to treat left posterior canal cupulolithiasis.  Patient educated to anticipate 24-48 hours mild symptoms of wooziness due to changing inner ear environment.  Would still recommend outpatient follow up to ensure resolution of symptoms and no balance issues higher level challenges indicative of community mobility.  Follow Up Recommendations  Outpatient PT     Equipment Recommendations  None recommended by PT    Recommendations for Other Services       Precautions / Restrictions Precautions Precautions: None    Mobility  Bed Mobility Overal bed mobility: Modified Independent                Transfers Overall transfer level: Modified independent                  Ambulation/Gait             General Gait Details: Gait deferred due to pt reports already walked down to end of east side and back today; focus of treatment on canalith repositioning   Stairs            Wheelchair Mobility    Modified Rankin (Stroke Patients Only)       Balance                                    Cognition Arousal/Alertness: Awake/alert Behavior During Therapy: WFL for tasks assessed/performed Overall Cognitive Status: Within Functional Limits for tasks assessed                      Exercises      General Comments General comments (skin integrity, edema, etc.): Supine head roll negative for nystagmus Rt/Lt; performed full hallpike to right no symptoms, to left with persistent left rotary nystagmus >  45 sec indiciative of left posterior canal cupulolithiasis.  Performed Liberatory maneuver x 2 with resolution of nystagmus after second treatment.        Pertinent Vitals/Pain Pain Assessment: No/denies pain    Home Living                      Prior Function            PT Goals (current goals can now be found in the care plan section) Progress towards PT goals: Progressing toward goals    Frequency  Min 3X/week    PT Plan Current plan remains appropriate    Co-evaluation             End of Session   Activity Tolerance: Patient tolerated treatment well Patient left: in chair;with call bell/phone within reach     Time: 0945-1013 PT Time Calculation (min) (ACUTE ONLY): 28 min  Charges:  $Therapeutic Activity: 8-22 mins $Canalith Rep Proc: 8-22 mins                    G Codes:  Functional Assessment Tool Used: Clinical Judgement Mobility: Walking and Moving Around Goal Status ([id number]): 0 percent  impaired, limited or restricted Mobility: Walking and Moving Around Discharge Status [phone]): At least 1 percent but less than 20 percent impaired, limited or restricted   [facility] 01/16/2015, 11:13 AM  Sheran Lawless, PT [phone] 01/16/2015

## 2015-01-16 NOTE — Care Management Note (Signed)
Case Management Note  Patient Details  Name: Benard Minturn MRN: [id number] Date of Birth: [birth year]  Subjective/Objective:                    Action/Plan: Met with patient to discuss discharge planning. Written prescription was provided by MD for Outpatient PT.  CM spoke with patient, who prefers the [employer].  Permission was given for CM to send electronic referral.  Order entered. Bedside RN updated.  Expected Discharge Date:                  Expected Discharge Plan:  Home/Self Care (Outpatient PT)  In-House Referral:     Discharge planning Services  CM Consult  Post Acute Care Choice:    Choice offered to:  Patient  DME Arranged:    DME Agency:     HH Arranged:    [place] Agency:     Status of Service:  Completed, signed off  Medicare Important Message Given:    Date Medicare IM Given:    Medicare IM give by:    Date Additional Medicare IM Given:    Additional Medicare Important Message give by:     If discussed at [place] of Stay Meetings, dates discussed:    Additional Comments:  Rolm Baptise, RN 01/16/2015, 11:46 AM

## 2015-01-16 NOTE — Progress Notes (Signed)
Discharge instruction reviewed with patient. All questions answered at this time. RXs given. Patient awaiting for transportation from family.    Gwendy Boeder, RN.

## 2015-01-16 NOTE — Discharge Summary (Signed)
Physician Discharge Summary  Alexander Gonzalez [id number] DOB: 10/22/1950 DOA: 01/14/2015  PCP: Egbert GaribaldiMillsaps, KIMBERLY M, NP  Admit date: 01/14/2015 Discharge date: 01/16/2015  Time spent: > 35  minutes  Recommendations for Outpatient Follow-up:  1. Monitor blood pressures and encourage compliance with medical regimen  Discharge Diagnoses:  Principal Problem:   Hypertensive urgency Active Problems:   HTN (hypertension)   Gout   Nausea   Cerebrovascular disease   Discharge Condition: stable  Diet recommendation: heart healthy/low sodium  Filed Weights   01/14/15 2045  Weight: 118.706 kg (261 lb 11.2 oz)    History of present illness:  64 year old who presented with headache secondary to uncontrolled hypertension secondary to noncompliance with medication regimen. Also complained of dizziness.  Hospital Course:  Hypertensive urgency - Resolved and BP improved. Currently patient is on amlodipine will discharge on this regimen - Recommend low sodium diet  Headache  - most likely due to principle problem - has resolved with improvement in BP control  Active Problems:  Gout - stable   Nausea - resolved  Dizziness - Continue try trial of meclizine on discharge -PT evaluation recommended outpatient PT as such will provide prescription to patient  Procedures:  None  Consultations:  None  Discharge Exam: Filed Vitals:   01/16/15 0952  BP: 159/70  Pulse: 69  Temp: 98.2 F (36.8 C)  Resp: 15    General: Patient in no acute distress, alert and awake Cardiovascular: Regular rate and rhythm, no murmurs or rubs Respiratory: Clear to auscultation bilaterally, no wheezes  Discharge Instructions   Discharge Instructions    Ambulatory referral to Physical Therapy    Complete by:  As directed      Call MD for:  redness, tenderness, or signs of infection (pain, swelling, redness, odor or green/yellow discharge around incision site)    Complete by:  As  directed      Call MD for:  severe uncontrolled pain    Complete by:  As directed      Call MD for:  temperature >100.4    Complete by:  As directed      Diet - low sodium heart healthy    Complete by:  As directed      Discharge instructions    Complete by:  As directed   Follow up with your primary care physician in 1-2 weeks or sooner should any new concerns arise.     Increase activity slowly    Complete by:  As directed           Current Discharge Medication List    START taking these medications   Details  aspirin EC 81 MG EC tablet Take 1 tablet (81 mg total) by mouth daily. Qty: 30 tablet, Refills: 0    HYDROcodone-acetaminophen (NORCO) 5-325 MG tablet Take 1-2 tablets by mouth every 6 (six) hours as needed. Qty: 10 tablet, Refills: 0    meclizine (ANTIVERT) 12.5 MG tablet Take 1 tablet (12.5 mg total) by mouth 2 (two) times daily as needed for dizziness. Qty: 30 tablet, Refills: 0      CONTINUE these medications which have NOT CHANGED   Details  amLODipine (NORVASC) 10 MG tablet Take 1 tablet (10 mg total) by mouth daily. Qty: 30 tablet, Refills: 3    COLCRYS 0.6 MG tablet Take 0.6 mg by mouth daily as needed (gout).     simvastatin (ZOCOR) 40 MG tablet Take 0.5 tablets (20 mg total) by mouth every evening. Take 20 mg -half  a tablet daily Qty: 30 tablet, Refills: 3      STOP taking these medications     losartan-hydrochlorothiazide (HYZAAR) 100-25 MG per tablet        No Known Allergies Follow-up Information    Follow up with [facility] EMERGENCY DEPARTMENT.   Specialty:  Emergency Medicine   Why:  As needed, If symptoms worsen   Contact information:   [address] [id number] mc [place] [address] [phone]       The results of significant diagnostics from this hospitalization (including imaging, microbiology, ancillary and laboratory) are listed below for reference.    Significant Diagnostic  Studies: Ct Head Wo Contrast  01/14/2015  CLINICAL DATA:  Severe temporal headache. Left arm pain. Dizziness and nausea. EXAM: CT HEAD WITHOUT CONTRAST TECHNIQUE: Contiguous axial images were obtained from the base of the skull through the vertex without intravenous contrast. COMPARISON:  None. FINDINGS: There is cortical atrophy and chronic microvascular ischemic change. No evidence of acute intracranial abnormality including hemorrhage, infarct, mass lesion, mass effect, midline shift or abnormal extra-axial fluid collection is identified. No hydrocephalus or pneumocephalus. Empty sella is noted. Imaged paranasal sinuses and mastoid air cells are clear. The calvarium is intact. IMPRESSION: No acute abnormality. Electronically Signed   By: Drusilla Kanner M.D.   On: 01/14/2015 15:15   Mr Brain Wo Contrast  01/14/2015  CLINICAL DATA:  Headache EXAM: MRI HEAD WITHOUT CONTRAST TECHNIQUE: Multiplanar, multiecho pulse sequences of the brain and surrounding structures were obtained without intravenous contrast. COMPARISON:  Head CT from earlier today FINDINGS: Calvarium and upper cervical spine: No focal marrow signal abnormality. Orbits: No significant findings. Sinuses and Mastoids: Clear. Brain: No acute abnormality such as infarct, hemorrhage, hydrocephalus, or mass lesion. Asymmetric loss of flow related hypointensity within the left vertebral artery could be from slow flow or partial occlusion (there is evidence of flow at the level of the PICA). Chronicity is uncertain, but no acute infarct. This is presumably atherosclerotic given calcification within the vertebrobasilar system on preceding head CT. No T1 signal suggestive of intramural hematoma. Patchy FLAIR hyperintensity in the bilateral cerebral white matter, mild small vessel ischemic change for age in this patient history of hypertension and hyperlipidemia. IMPRESSION: 1. No definitive explanation for headache. 2. Abnormal signal in the left  vertebral artery, slow flow versus partial occlusion. No associated acute or remote infarct. Electronically Signed   By: Marnee Spring M.D.   On: 01/14/2015 17:16    Microbiology: No results found for this or any previous visit (from the past 240 hour(s)).   Labs: Basic Metabolic Panel:  Recent Labs Lab 01/14/15 1247 01/14/15 1254 01/15/15 0438  NA 135 140 136  K 3.8 3.8 3.8  CL 98* 102 102  CO2 23  --  25  GLUCOSE 122* 124* 97  BUN 14 19 21*  CREATININE 1.50* 1.50* 1.36*  CALCIUM 9.8  --  9.0   Liver Function Tests:  Recent Labs Lab 01/14/15 1247  AST 24  ALT 18  ALKPHOS 63  BILITOT 1.2  PROT 8.7*  ALBUMIN 4.2   No results for input(s): LIPASE, AMYLASE in the last 168 hours. No results for input(s): AMMONIA in the last 168 hours. CBC:  Recent Labs Lab 01/14/15 1247 01/14/15 1254  WBC 5.2  --   NEUTROABS 3.1  --   HGB 16.7 18.7*  HCT 49.8 55.0*  MCV 85.6  --   PLT 226  --    Cardiac Enzymes:  No results for input(s): CKTOTAL, CKMB, CKMBINDEX, TROPONINI in the last 168 hours. BNP: BNP (last 3 results) No results for input(s): BNP in the last 8760 hours.  ProBNP (last 3 results) No results for input(s): PROBNP in the last 8760 hours.  CBG:  Recent Labs Lab 01/14/15 1250  GLUCAP 116*       Signed:  Penny Pia  [facility] 01/16/2015, 1:41 PM

## 2015-04-05 ENCOUNTER — Other Ambulatory Visit: Payer: Self-pay | Admitting: Cardiology

## 2015-07-19 ENCOUNTER — Inpatient Hospital Stay (HOSPITAL_COMMUNITY)
Admission: EM | Admit: 2015-07-19 | Discharge: 2015-07-27 | DRG: 103 | Disposition: A | Discharge status: Home Health | Payer: Medicare Other | Attending: Internal Medicine | Admitting: Internal Medicine

## 2015-07-19 ENCOUNTER — Emergency Department (HOSPITAL_COMMUNITY): Payer: Medicare Other

## 2015-07-19 ENCOUNTER — Encounter (HOSPITAL_COMMUNITY): Payer: Self-pay | Admitting: Emergency Medicine

## 2015-07-19 DIAGNOSIS — R519 Headache, unspecified: Secondary | ICD-10-CM

## 2015-07-19 DIAGNOSIS — I679 Cerebrovascular disease, unspecified: Secondary | ICD-10-CM

## 2015-07-19 DIAGNOSIS — E785 Hyperlipidemia, unspecified: Secondary | ICD-10-CM | POA: Diagnosis present

## 2015-07-19 DIAGNOSIS — R531 Weakness: Secondary | ICD-10-CM | POA: Diagnosis present

## 2015-07-19 DIAGNOSIS — I651 Occlusion and stenosis of basilar artery: Secondary | ICD-10-CM

## 2015-07-19 DIAGNOSIS — N183 Chronic kidney disease, stage 3 unspecified: Secondary | ICD-10-CM | POA: Diagnosis present

## 2015-07-19 DIAGNOSIS — I160 Hypertensive urgency: Secondary | ICD-10-CM

## 2015-07-19 DIAGNOSIS — I10 Essential (primary) hypertension: Secondary | ICD-10-CM | POA: Diagnosis present

## 2015-07-19 DIAGNOSIS — R51 Headache: Secondary | ICD-10-CM | POA: Diagnosis not present

## 2015-07-19 DIAGNOSIS — I161 Hypertensive emergency: Secondary | ICD-10-CM | POA: Insufficient documentation

## 2015-07-19 DIAGNOSIS — R9389 Abnormal findings on diagnostic imaging of other specified body structures: Secondary | ICD-10-CM | POA: Diagnosis present

## 2015-07-19 DIAGNOSIS — Z7982 Long term (current) use of aspirin: Secondary | ICD-10-CM

## 2015-07-19 DIAGNOSIS — R079 Chest pain, unspecified: Secondary | ICD-10-CM

## 2015-07-19 DIAGNOSIS — Z8673 Personal history of transient ischemic attack (TIA), and cerebral infarction without residual deficits: Secondary | ICD-10-CM

## 2015-07-19 DIAGNOSIS — R2990 Unspecified symptoms and signs involving the nervous system: Secondary | ICD-10-CM

## 2015-07-19 DIAGNOSIS — N179 Acute kidney failure, unspecified: Secondary | ICD-10-CM | POA: Diagnosis present

## 2015-07-19 DIAGNOSIS — G43919 Migraine, unspecified, intractable, without status migrainosus: Principal | ICD-10-CM | POA: Diagnosis present

## 2015-07-19 DIAGNOSIS — I129 Hypertensive chronic kidney disease with stage 1 through stage 4 chronic kidney disease, or unspecified chronic kidney disease: Secondary | ICD-10-CM | POA: Diagnosis present

## 2015-07-19 DIAGNOSIS — R739 Hyperglycemia, unspecified: Secondary | ICD-10-CM | POA: Diagnosis present

## 2015-07-19 DIAGNOSIS — R05 Cough: Secondary | ICD-10-CM

## 2015-07-19 DIAGNOSIS — R0789 Other chest pain: Secondary | ICD-10-CM | POA: Diagnosis present

## 2015-07-19 DIAGNOSIS — R059 Cough, unspecified: Secondary | ICD-10-CM

## 2015-07-19 LAB — I-STAT CHEM 8, ED
BUN: 18 mg/dL (ref 6–20)
CALCIUM ION: 1.09 mmol/L — AB (ref 1.13–1.30)
CREATININE: 1.4 mg/dL — AB (ref 0.61–1.24)
Chloride: 104 mmol/L (ref 101–111)
Glucose, Bld: 153 mg/dL — ABNORMAL HIGH (ref 65–99)
HEMATOCRIT: 47 % (ref 39.0–52.0)
HEMOGLOBIN: 16 g/dL (ref 13.0–17.0)
Potassium: 4.4 mmol/L (ref 3.5–5.1)
SODIUM: 140 mmol/L (ref 135–145)
TCO2: 22 mmol/L (ref 0–100)

## 2015-07-19 LAB — PROTIME-INR
INR: 1.1 (ref 0.00–1.49)
Prothrombin Time: 14.4 seconds (ref 11.6–15.2)

## 2015-07-19 LAB — CBG MONITORING, ED: Glucose-Capillary: 131 mg/dL — ABNORMAL HIGH (ref 65–99)

## 2015-07-19 LAB — COMPREHENSIVE METABOLIC PANEL
ALT: 21 U/L (ref 17–63)
AST: 26 U/L (ref 15–41)
Albumin: 4.1 g/dL (ref 3.5–5.0)
Alkaline Phosphatase: 59 U/L (ref 38–126)
Anion gap: 13 (ref 5–15)
BILIRUBIN TOTAL: 0.9 mg/dL (ref 0.3–1.2)
BUN: 14 mg/dL (ref 6–20)
CHLORIDE: 105 mmol/L (ref 101–111)
CO2: 22 mmol/L (ref 22–32)
CREATININE: 1.44 mg/dL — AB (ref 0.61–1.24)
Calcium: 9.2 mg/dL (ref 8.9–10.3)
GFR, EST AFRICAN AMERICAN: 57 mL/min — AB (ref >60)
GFR, EST NON AFRICAN AMERICAN: 50 mL/min — AB (ref >60)
Glucose, Bld: 157 mg/dL — ABNORMAL HIGH (ref 65–99)
POTASSIUM: 4.5 mmol/L (ref 3.5–5.1)
Sodium: 140 mmol/L (ref 135–145)
TOTAL PROTEIN: 8.1 g/dL (ref 6.5–8.1)

## 2015-07-19 LAB — DIFFERENTIAL
BASOS PCT: 0 %
Basophils Absolute: 0 10*3/uL (ref 0.0–0.1)
EOS ABS: 0.3 10*3/uL (ref 0.0–0.7)
EOS PCT: 5 %
Lymphocytes Relative: 35 %
Lymphs Abs: 2.1 10*3/uL (ref 0.7–4.0)
MONO ABS: 0.4 10*3/uL (ref 0.1–1.0)
MONOS PCT: 6 %
Neutro Abs: 3.2 10*3/uL (ref 1.7–7.7)
Neutrophils Relative %: 54 %

## 2015-07-19 LAB — APTT: APTT: 29 s (ref 24–37)

## 2015-07-19 LAB — CBC
HEMATOCRIT: 42.2 % (ref 39.0–52.0)
Hemoglobin: 13.7 g/dL (ref 13.0–17.0)
MCH: 27.9 pg (ref 26.0–34.0)
MCHC: 32.5 g/dL (ref 30.0–36.0)
MCV: 85.9 fL (ref 78.0–100.0)
Platelets: 177 10*3/uL (ref 150–400)
RBC: 4.91 MIL/uL (ref 4.22–5.81)
RDW: 13.6 % (ref 11.5–15.5)
WBC: 5.9 10*3/uL (ref 4.0–10.5)

## 2015-07-19 LAB — I-STAT TROPONIN, ED: TROPONIN I, POC: 0.01 ng/mL (ref 0.00–0.08)

## 2015-07-19 MED ORDER — MORPHINE SULFATE (PF) 4 MG/ML IV SOLN
4.0000 mg | Freq: Once | INTRAVENOUS | Status: AC
  Administered 2015-07-19: 4 mg via INTRAVENOUS
  Filled 2015-07-19: qty 1

## 2015-07-19 MED ORDER — BACLOFEN 10 MG PO TABS
10.0000 mg | ORAL_TABLET | Freq: Once | ORAL | Status: AC
  Administered 2015-07-19: 10 mg via ORAL
  Filled 2015-07-19: qty 1

## 2015-07-19 MED ORDER — ENOXAPARIN SODIUM 40 MG/0.4ML ~~LOC~~ SOLN
40.0000 mg | SUBCUTANEOUS | Status: DC
  Administered 2015-07-19 – 2015-07-22 (×4): 40 mg via SUBCUTANEOUS
  Filled 2015-07-19 (×4): qty 0.4

## 2015-07-19 MED ORDER — LORAZEPAM 2 MG/ML IJ SOLN
0.5000 mg | Freq: Once | INTRAMUSCULAR | Status: AC
  Administered 2015-07-19: 0.5 mg via INTRAVENOUS
  Filled 2015-07-19: qty 1

## 2015-07-19 MED ORDER — AMLODIPINE BESYLATE 10 MG PO TABS
10.0000 mg | ORAL_TABLET | Freq: Every day | ORAL | Status: DC
  Administered 2015-07-19 – 2015-07-27 (×7): 10 mg via ORAL
  Filled 2015-07-19 (×7): qty 1

## 2015-07-19 MED ORDER — KETOROLAC TROMETHAMINE 30 MG/ML IJ SOLN
15.0000 mg | Freq: Once | INTRAMUSCULAR | Status: AC
  Administered 2015-07-19: 15 mg via INTRAVENOUS

## 2015-07-19 MED ORDER — OXYCODONE HCL 5 MG PO TABS
5.0000 mg | ORAL_TABLET | Freq: Four times a day (QID) | ORAL | Status: DC | PRN
  Administered 2015-07-20 – 2015-07-23 (×3): 5 mg via ORAL
  Filled 2015-07-19 (×3): qty 1

## 2015-07-19 MED ORDER — LABETALOL HCL 5 MG/ML IV SOLN
10.0000 mg | Freq: Once | INTRAVENOUS | Status: AC
  Administered 2015-07-19: 10 mg via INTRAVENOUS
  Filled 2015-07-19: qty 4

## 2015-07-19 MED ORDER — IOPAMIDOL (ISOVUE-370) INJECTION 76%
INTRAVENOUS | Status: AC
  Administered 2015-07-19: 80 mL
  Filled 2015-07-19: qty 100

## 2015-07-19 MED ORDER — LOSARTAN POTASSIUM 50 MG PO TABS
100.0000 mg | ORAL_TABLET | Freq: Every day | ORAL | Status: DC
  Administered 2015-07-19 – 2015-07-22 (×3): 100 mg via ORAL
  Filled 2015-07-19 (×3): qty 2

## 2015-07-19 MED ORDER — SODIUM CHLORIDE 0.9 % IV SOLN
250.0000 mg | Freq: Once | INTRAVENOUS | Status: AC
  Administered 2015-07-19: 250 mg via INTRAVENOUS
  Filled 2015-07-19: qty 2

## 2015-07-19 MED ORDER — ONDANSETRON HCL 4 MG/2ML IJ SOLN
4.0000 mg | Freq: Once | INTRAMUSCULAR | Status: AC
Start: 2015-07-19 — End: 2015-07-19
  Administered 2015-07-19: 4 mg via INTRAVENOUS

## 2015-07-19 MED ORDER — ASPIRIN EC 81 MG PO TBEC
81.0000 mg | DELAYED_RELEASE_TABLET | Freq: Every day | ORAL | Status: DC
  Administered 2015-07-21 – 2015-07-27 (×6): 81 mg via ORAL
  Filled 2015-07-19 (×6): qty 1

## 2015-07-19 MED ORDER — VALPROATE SODIUM 500 MG/5ML IV SOLN
500.0000 mg | Freq: Once | INTRAVENOUS | Status: AC
  Administered 2015-07-19: 500 mg via INTRAVENOUS
  Filled 2015-07-19: qty 5

## 2015-07-19 MED ORDER — ACETAMINOPHEN 500 MG PO TABS
1000.0000 mg | ORAL_TABLET | Freq: Four times a day (QID) | ORAL | Status: DC | PRN
  Administered 2015-07-19 – 2015-07-21 (×3): 1000 mg via ORAL
  Filled 2015-07-19 (×3): qty 2

## 2015-07-19 MED ORDER — HYDRALAZINE HCL 20 MG/ML IJ SOLN: 10.0000 mg | INTRAMUSCULAR | Status: DC | PRN

## 2015-07-19 MED ORDER — STROKE: EARLY STAGES OF RECOVERY BOOK
Freq: Once | Status: AC
  Administered 2015-07-19: 17:00:00
  Filled 2015-07-19: qty 1

## 2015-07-19 MED ORDER — SIMVASTATIN 20 MG PO TABS
20.0000 mg | ORAL_TABLET | Freq: Every evening | ORAL | Status: DC
  Administered 2015-07-19 – 2015-07-26 (×8): 20 mg via ORAL
  Filled 2015-07-19 (×8): qty 1

## 2015-07-19 NOTE — ED Provider Notes (Signed)
CSN: [id number]     Arrival date & time 07/19/15  1121 History   First MD Initiated Contact with Patient 07/19/15 1121    Chief Complaint- headache and weakness   Patient is a 65 y.o. male presenting with headaches. The history is provided by the patient and a relative. The history is limited by the condition of the patient.  Headache Pain location:  Generalized Quality:  Unable to specify Onset quality:  Sudden Timing:  Constant Progression:  Worsening Chronicity:  New Relieved by:  Nothing Worsened by:  Nothing Associated symptoms: nausea, vomiting and weakness   Associated symptoms: no abdominal pain, no back pain, no fever and no neck pain   Patient presents with HA and weakness Per daughter (she stays with him) he took a nap this morning and when he woke up he had severe HA and was having nausea/vomiting.  She reports he appeared to be in intense pain.   EMS was called and they noted he had right sided weakness Code stroke was activated  Pt had recently been in usual health, he is ambulatory at baseline.  His HA/vomiting started earlier this morning  Pt reports HA but denies cp/sob.  He denies any other pain complaints at this time  Past Medical History  Diagnosis Date  . Hypertension   . Osteoarthritis   . Mini stroke (HCC) 1996  . Hyperlipidemia    Past Surgical History  Procedure Laterality Date  . Knee surgery  1976    right  . Replacement total knee  1996    left  . Knee surgery  1980'2    right   Family History  Problem Relation Age of Onset  . Cancer Mother   . Hypertension Mother   . Heart attack Father    Social History  Substance Use Topics  . Smoking status: Never Smoker   . Smokeless tobacco: Not on file  . Alcohol Use: Yes     Comment: occas.    Review of Systems  Constitutional: Negative for fever.  Cardiovascular: Negative for chest pain.  Gastrointestinal: Positive for nausea and vomiting. Negative for abdominal pain.  Musculoskeletal:  Negative for back pain and neck pain.  Neurological: Positive for weakness and headaches.  All other systems reviewed and are negative.     Allergies  Review of patient's allergies indicates no known allergies.  Home Medications   Prior to Admission medications   Medication Sig Start Date End Date Taking? Authorizing Provider  amLODipine (NORVASC) 10 MG tablet Take 1 tablet (10 mg total) by mouth daily. 11/24/12   Leone Brand, NP  aspirin EC 81 MG EC tablet Take 1 tablet (81 mg total) by mouth daily. 01/16/15   Penny Pia, MD  COLCRYS 0.6 MG tablet Take 0.6 mg by mouth daily as needed (gout).  09/30/12   Historical Provider, MD  meclizine (ANTIVERT) 12.5 MG tablet Take 1 tablet (12.5 mg total) by mouth 2 (two) times daily as needed for dizziness. 01/16/15   Penny Pia, MD  simvastatin (ZOCOR) 40 MG tablet Take 0.5 tablets (20 mg total) by mouth every evening. Take 20 mg -half a tablet daily 11/24/12   Leone Brand, NP   BP 199/112 mmHg  Pulse 89  Temp(Src) 97.5 F (36.4 C) (Oral)  Resp 17  Wt 124.9 kg  SpO2 100% Physical Exam CONSTITUTIONAL: Well developed/well nourished, anxious HEAD: Normocephalic/atraumatic EYES: EOMI/PERRL ENMT: Mucous membranes moist NECK: supple no meningeal signs, no bruits SPINE/BACK:no c-spine tenderness CV: S1/S2  noted, no murmurs/rubs/gallops noted LUNGS: Lungs are clear to auscultation bilaterally, no apparent distress ABDOMEN: soft, nontender NEURO: Pt is awake/alert/appropriate he is anxious and appears in pain.  He has no facial droop No arm/leg drift.    EXTREMITIES: pulses normal/equal, full ROM SKIN: warm, color normal PSYCH: anxious  ED Course  Procedures   11:49 AM tPA in stroke considered but not given due to: Code stroke cancelled by dr Lavon Paganini not a TPA candidate  11:49 AM Plan is to obtain CT angio of head/neck per neuro Dr Lavon Paganini ordered toradol for his HA Also, will add on pain meds and also labetalol for BP  control Apparently, pt had similar episode last year and had negative MRI at that time 3:03 PM Neurology has arranged for IR intervention tomorrow Dr Lavon Paganini request admit to triad D/w triad, admit to dr Konrad Dolores Pt/family updated BP 191/92 mmHg  Pulse 75  Temp(Src) 97.5 F (36.4 C) (Oral)  Resp 13  Wt 124.9 kg  SpO2 97%  Labs Review Labs Reviewed  COMPREHENSIVE METABOLIC PANEL - Abnormal; Notable for the following:    Glucose, Bld 157 (*)    Creatinine, Ser 1.44 (*)    GFR calc non Af Amer 50 (*)    GFR calc Af Amer 57 (*)    All other components within normal limits  CBG MONITORING, ED - Abnormal; Notable for the following:    Glucose-Capillary 131 (*)    All other components within normal limits  I-STAT CHEM 8, ED - Abnormal; Notable for the following:    Creatinine, Ser 1.40 (*)    Glucose, Bld 153 (*)    Calcium, Ion 1.09 (*)    All other components within normal limits  PROTIME-INR  APTT  CBC  DIFFERENTIAL  I-STAT TROPOININ, ED    Imaging Review Ct Angio Head W/cm &/or Wo Cm  07/19/2015  CLINICAL DATA:  65 year old male code stroke. Severe headache and weakness. Right side weakness. Initial encounter. EXAM: CT ANGIOGRAPHY HEAD AND NECK TECHNIQUE: Multidetector CT imaging of the head and neck was performed using the standard protocol during bolus administration of intravenous contrast. Multiplanar CT image reconstructions and MIPs were obtained to evaluate the vascular anatomy. Carotid stenosis measurements (when applicable) are obtained utilizing NASCET criteria, using the distal internal carotid diameter as the denominator. CONTRAST:  80 mL Isovue 370. COMPARISON:  Head CT without contrast 1123 hours today. Brain MRI 01/14/2015. FINDINGS: CTA NECK Skeleton: Intermittent poor dentition. Degenerative changes in the cervical and visualized thoracic spine. No acute osseous abnormality identified. Mild to moderate paranasal sinus mucosal thickening with chronic periosteal  thickening suggesting chronic sinus disease. Other neck: Mediastinal lipomatosis. No superior mediastinal lymphadenopathy. Negative visualized lung apices aside from atelectasis. Negative thyroid, larynx, pharynx, parapharyngeal spaces, retropharyngeal space, sublingual space, submandibular glands and parotid glands. No cervical lymphadenopathy. Aortic arch: Bovine arch configuration. Minimal to mild calcified arch atherosclerosis. No great vessel origin stenosis. Right carotid system: Mildly tortuous right CCA. Bulky soft and to a lesser extent calcified plaque at the right carotid bifurcation resulting in right ICA origin stenosis of 65 % with respect to the distal vessel. Otherwise negative cervical right ICA. Left carotid system: Tortuous proximal left CCA. Bovine type left CCA origin. At the left carotid bifurcation there is circumferential soft plaque at the left ICA origin, but less than 50 % stenosis with respect to the distal vessel results (series 9, image 119). Otherwise negative cervical left ICA. Vertebral arteries: No proximal right subclavian artery stenosis. Soft  plaque with mild stenosis at the right vertebral artery origin (series 9, image 141). Dominant right vertebral artery otherwise is widely patent to the skullbase. No proximal left subclavian artery stenosis. Non dominant left vertebral artery with a normal origin. Tortuous left V1 segment. No left vertebral artery stenosis to the skullbase. CTA HEAD Posterior circulation: Dominant right vertebral artery with V4 segment calcified plaque resulting an mild to moderate stenosis. Right PICA origin is patent. Non dominant left vertebral artery terminates in the left PICA. Calcified plaque at the basilar artery origin not resulting in stenosis, but there is widespread basilar artery irregularity related to soft atherosclerotic plaque such that a beaded appearance occurs throughout the basilar artery (series 12 images 27 and 28). No high-grade  stenosis occurs. The basilar tip remains patent. SCA and MCA origins are patent, but there is moderate to severe stenosis at the left PCA origin. There is additional moderate tandem left P2 segment stenosis. Bilateral distal PCA branches remain patent. Anterior circulation: Soft plaque in the left ICA at the skullbase resulting in up to 50 % stenosis with respect to the distal vessel. Left ICA siphon is patent with mild cavernous and moderate supraclinoid segment calcified plaque not resulting in stenosis. Patent left ICA terminus. Normal left ophthalmic artery origin. Mild cavernous and moderate supra clinoid right ICA calcified plaque without stenosis. Normal right ophthalmic artery origin. Patent right ICA terminus. Right MCA and ACA origins are within normal limits. Fenestrated anterior communicating artery is present. Bilateral A2 and distal ACA branches are within normal limits. Right MCA M1 segment, bifurcation, and right MCA branches are within normal limits. Left ACA origin is highly stenotic such that there is minimal if any flow in the left A1 segment. Still, the left A2 is adequately supplied by the anterior communicating artery. Left MCA origin, M1 segment, bifurcation, and left MCA branches are within normal limits. Venous sinuses: Patent. Anatomic variants: Dominant right vertebral artery. Left vertebral artery terminates in PICA. Bovine type arch configuration. IMPRESSION: 1. Negative for emergent large vessel occlusion. 2. Bulky soft plaque at the right carotid bifurcation resulting in 65% stenosis of the right ICA origin. 3. Severe intracranial basilar artery atherosclerosis, resulting in a beaded appearance of the vessel throughout its course, but no hemodynamically significant basilar artery stenosis. There is superimposed mild to moderate stenosis of the right vertebral artery V4 segment which supplies the basilar. The non dominant left vertebral artery terminates in the left PICA. 4. Severe  stenosis at the left PCA origin with preserved distal flow. 5. Severe stenosis at the left ACA origin with adequately reconstituted A2 segment flow via the anterior communicating artery. 6. Other intracranial and extracranial atherosclerosis is not hemodynamically significant. Electronically Signed   By: Odessa Fleming M.D.   On: 07/19/2015 12:51   Ct Angio Neck W/cm &/or Wo/cm  07/19/2015  CLINICAL DATA:  65 year old male code stroke. Severe headache and weakness. Right side weakness. Initial encounter. EXAM: CT ANGIOGRAPHY HEAD AND NECK TECHNIQUE: Multidetector CT imaging of the head and neck was performed using the standard protocol during bolus administration of intravenous contrast. Multiplanar CT image reconstructions and MIPs were obtained to evaluate the vascular anatomy. Carotid stenosis measurements (when applicable) are obtained utilizing NASCET criteria, using the distal internal carotid diameter as the denominator. CONTRAST:  80 mL Isovue 370. COMPARISON:  Head CT without contrast 1123 hours today. Brain MRI 01/14/2015. FINDINGS: CTA NECK Skeleton: Intermittent poor dentition. Degenerative changes in the cervical and visualized thoracic spine. No acute osseous  abnormality identified. Mild to moderate paranasal sinus mucosal thickening with chronic periosteal thickening suggesting chronic sinus disease. Other neck: Mediastinal lipomatosis. No superior mediastinal lymphadenopathy. Negative visualized lung apices aside from atelectasis. Negative thyroid, larynx, pharynx, parapharyngeal spaces, retropharyngeal space, sublingual space, submandibular glands and parotid glands. No cervical lymphadenopathy. Aortic arch: Bovine arch configuration. Minimal to mild calcified arch atherosclerosis. No great vessel origin stenosis. Right carotid system: Mildly tortuous right CCA. Bulky soft and to a lesser extent calcified plaque at the right carotid bifurcation resulting in right ICA origin stenosis of 65 % with respect  to the distal vessel. Otherwise negative cervical right ICA. Left carotid system: Tortuous proximal left CCA. Bovine type left CCA origin. At the left carotid bifurcation there is circumferential soft plaque at the left ICA origin, but less than 50 % stenosis with respect to the distal vessel results (series 9, image 119). Otherwise negative cervical left ICA. Vertebral arteries: No proximal right subclavian artery stenosis. Soft plaque with mild stenosis at the right vertebral artery origin (series 9, image 141). Dominant right vertebral artery otherwise is widely patent to the skullbase. No proximal left subclavian artery stenosis. Non dominant left vertebral artery with a normal origin. Tortuous left V1 segment. No left vertebral artery stenosis to the skullbase. CTA HEAD Posterior circulation: Dominant right vertebral artery with V4 segment calcified plaque resulting an mild to moderate stenosis. Right PICA origin is patent. Non dominant left vertebral artery terminates in the left PICA. Calcified plaque at the basilar artery origin not resulting in stenosis, but there is widespread basilar artery irregularity related to soft atherosclerotic plaque such that a beaded appearance occurs throughout the basilar artery (series 12 images 27 and 28). No high-grade stenosis occurs. The basilar tip remains patent. SCA and MCA origins are patent, but there is moderate to severe stenosis at the left PCA origin. There is additional moderate tandem left P2 segment stenosis. Bilateral distal PCA branches remain patent. Anterior circulation: Soft plaque in the left ICA at the skullbase resulting in up to 50 % stenosis with respect to the distal vessel. Left ICA siphon is patent with mild cavernous and moderate supraclinoid segment calcified plaque not resulting in stenosis. Patent left ICA terminus. Normal left ophthalmic artery origin. Mild cavernous and moderate supra clinoid right ICA calcified plaque without stenosis.  Normal right ophthalmic artery origin. Patent right ICA terminus. Right MCA and ACA origins are within normal limits. Fenestrated anterior communicating artery is present. Bilateral A2 and distal ACA branches are within normal limits. Right MCA M1 segment, bifurcation, and right MCA branches are within normal limits. Left ACA origin is highly stenotic such that there is minimal if any flow in the left A1 segment. Still, the left A2 is adequately supplied by the anterior communicating artery. Left MCA origin, M1 segment, bifurcation, and left MCA branches are within normal limits. Venous sinuses: Patent. Anatomic variants: Dominant right vertebral artery. Left vertebral artery terminates in PICA. Bovine type arch configuration. IMPRESSION: 1. Negative for emergent large vessel occlusion. 2. Bulky soft plaque at the right carotid bifurcation resulting in 65% stenosis of the right ICA origin. 3. Severe intracranial basilar artery atherosclerosis, resulting in a beaded appearance of the vessel throughout its course, but no hemodynamically significant basilar artery stenosis. There is superimposed mild to moderate stenosis of the right vertebral artery V4 segment which supplies the basilar. The non dominant left vertebral artery terminates in the left PICA. 4. Severe stenosis at the left PCA origin with preserved distal flow. 5.  Severe stenosis at the left ACA origin with adequately reconstituted A2 segment flow via the anterior communicating artery. 6. Other intracranial and extracranial atherosclerosis is not hemodynamically significant. Electronically Signed   By: Odessa FlemingH  Hall M.D.   On: 07/19/2015 12:51   Ct Head Code Stroke W/o Cm  07/19/2015  CLINICAL DATA:  Severe headache and right-sided weakness today. EXAM: CT HEAD WITHOUT CONTRAST TECHNIQUE: Contiguous axial images were obtained from the base of the skull through the vertex without intravenous contrast. COMPARISON:  Head CT 01/14/2015 and MRI brain 01/14/2015  FINDINGS: Stable age related cerebral atrophy, ventriculomegaly and periventricular white matter disease. No findings for acute hemispheric infarction an or intracranial hemorrhage. No extra-axial fluid collections are identified. No mass lesions. The brainstem and cerebellum are grossly normal in stable. Vascular calcifications are noted. No acute bony findings. Extensive mucoperiosteal thickening involving the maxillary and ethmoid sinuses bilaterally. The mastoid air cells and middle ear cavities are clear. The globes are intact. IMPRESSION: No acute intracranial findings. Specifically, no findings for hemispheric infarction or intracranial hemorrhage. Stable age related cerebral atrophy and mild ventriculomegaly. Electronically Signed   By: Rudie MeyerP.  Gallerani M.D.   On: 07/19/2015 11:29   I have personally reviewed and evaluated these lab results as part of my medical decision-making.   EKG Interpretation   Date/Time:  Thursday July 19 2015 11:33:23 EDT Ventricular Rate:  92 PR Interval:  157 QRS Duration: 91 QT Interval:  363 QTC Calculation: 449 R Axis:   20 Text Interpretation:  Sinus rhythm Consider left atrial enlargement No  significant change since last tracing Confirmed by Bebe ShaggyWICKLINE  MD, Royce Stegman  [phone]) on 07/19/2015 11:43:40 AM     Medications  ketorolac (TORADOL) 30 MG/ML injection 15 mg (15 mg Intravenous Given 07/19/15 1130)  ondansetron (ZOFRAN) injection 4 mg (4 mg Intravenous Given 07/19/15 1131)  labetalol (NORMODYNE,TRANDATE) injection 10 mg (10 mg Intravenous Given 07/19/15 1144)  morphine 4 MG/ML injection 4 mg (4 mg Intravenous Given 07/19/15 1144)  valproate (DEPACON) 500 mg in dextrose 5 % 50 mL IVPB (500 mg Intravenous New Bag/Given 07/19/15 1400)  methylPREDNISolone sodium succinate (SOLU-MEDROL) 250 mg in sodium chloride 0.9 % 50 mL IVPB (250 mg Intravenous Given 07/19/15 1312)  baclofen (LIORESAL) tablet 10 mg (10 mg Oral Given 07/19/15 1312)  LORazepam (ATIVAN) injection  0.5 mg (0.5 mg Intravenous Given 07/19/15 1148)  iopamidol (ISOVUE-370) 76 % injection (80 mLs  Contrast Given 07/19/15 1201)  morphine 4 MG/ML injection 4 mg (4 mg Intravenous Given 07/19/15 1400)    MDM   Final diagnoses:  Acute intractable headache, unspecified headache type  Cerebrovascular disease  Hypertensive urgency    Nursing notes including past medical history and social history reviewed and considered in documentation Labs/vital reviewed myself and considered during evaluation     Zadie Rhineonald Mantaj Chamberlin, MD 07/19/15 1507

## 2015-07-19 NOTE — Code Documentation (Signed)
Patient last known well this morning at 0730, he laid back down and awoke with a sever head ache at 0945.  EMS was called and they noted Right arm weakness and right leg numbness.  Code Stroke was called enroute at 1100.  Stat labs and head CT done.  Weakness resolved prior to arrival.  He continues to complain of a sever HA, also nausea.  Dr Lavon PaganiniNandigam at bedside to assess patient.  Tordol, Morphine, Ativan and Zofran given.  Patient HA slightly improved.  Family at bedside.  Plan CTA.  BP 199/100 10mg  labetalol given.

## 2015-07-19 NOTE — ED Notes (Signed)
Attempted report x1. 

## 2015-07-19 NOTE — ED Notes (Signed)
Pt LSN 0730. Sudden onsent of HA 0945 right sided weakness. Pt told EMS he could not feel his right leg. EMS reports symptoms resolved en route except his HA. CBG 147, systolic BP 160 per EMS. SR in the 70's. Pt alert and oriented

## 2015-07-19 NOTE — Progress Notes (Signed)
Pt received at 16:20pm with no noted distress. Pt complaining of sharp pain to head. Received report from nurse stating that pain medication was administered. Pt requested for his head to be lowered, turned on right side, cool washcloth to forehead. Pt stated that he felt better and fell asleep. Md notified, received new order for Oxy IR and Tylenol for headache and pain. Family at bedside. Safety measures in place. Call bell within reach. Will continue to monitor.

## 2015-07-19 NOTE — Consult Note (Signed)
Requesting Physician: Dr.  Bebe ShaggyWickline    Reason for consultation:  Stroke Code  HPI:                                                                                                                                         Alexander Gonzalez is an 65 y.o. male patient who  was brought into the emergency room for evaluation of acute onset of severe headache at this morning. Per EMS, patient reported having severe headache around 9:45 AM. When that the tumor. He initially appeared to have some right-sided weakness and stroke was called in the field. But en route they reported that his weakness is resolved. Patient unable to provide any history is in severe distress from headache has been holding his head and crying because of the pain. Not cooperative for any further history repair multiple forms for exam. No family at bedside. His blood pressure is elevated to 199 systolic.  Review of EMR revealed that he was in the hospital in October 2016 with severe headache had a brain MRI done at that time which was negative.  Date last known well:  2017, April 20th  Time last known well:  9.45 am tPA Given: No: Nonfocal neurological exam, likely severe migraine  Stroke Risk Factors - hypertension   Past Medical History: Past Medical History  Diagnosis Date  . Hypertension   . Osteoarthritis   . Mini stroke (HCC) 1996  . Hyperlipidemia     Past Surgical History  Procedure Laterality Date  . Knee surgery  1976    right  . Replacement total knee  1996    left  . Knee surgery  1980'2    right    Family History: Family History  Problem Relation Age of Onset  . Cancer Mother   . Hypertension Mother   . Heart attack Father     Social History:   reports that he has never smoked. He does not have any smokeless tobacco history on file. He reports that he drinks alcohol. He reports that he does not use illicit drugs.  Allergies:  No Known Allergies   Medications:                                                                                                                          Current facility-administered medications:  .  baclofen (LIORESAL) tablet  10 mg, 10 mg, Oral, Once, Damont Balles Daniel Nones, MD .  labetalol (NORMODYNE,TRANDATE) injection 10 mg, 10 mg, Intravenous, Once, Zadie Rhine, MD .  LORazepam (ATIVAN) injection 0.5 mg, 0.5 mg, Intravenous, Once, Carrol Hougland Daniel Nones, MD .  methylPREDNISolone sodium succinate (SOLU-MEDROL) 250 mg in sodium chloride 0.9 % 50 mL IVPB, 250 mg, Intravenous, Once, Laurie Penado Daniel Nones, MD .  morphine 4 MG/ML injection 4 mg, 4 mg, Intravenous, Once, Zadie Rhine, MD .  valproate (DEPACON) 500 mg in dextrose 5 % 50 mL IVPB, 500 mg, Intravenous, Once, Guilianna Mckoy Daniel Nones, MD  Current outpatient prescriptions:  .  amLODipine (NORVASC) 10 MG tablet, Take 1 tablet (10 mg total) by mouth daily., Disp: 30 tablet, Rfl: 3 .  aspirin EC 81 MG EC tablet, Take 1 tablet (81 mg total) by mouth daily., Disp: 30 tablet, Rfl: 0 .  COLCRYS 0.6 MG tablet, Take 0.6 mg by mouth daily as needed (gout). , Disp: , Rfl:  .  meclizine (ANTIVERT) 12.5 MG tablet, Take 1 tablet (12.5 mg total) by mouth 2 (two) times daily as needed for dizziness., Disp: 30 tablet, Rfl: 0 .  simvastatin (ZOCOR) 40 MG tablet, Take 0.5 tablets (20 mg total) by mouth every evening. Take 20 mg -half a tablet daily, Disp: 30 tablet, Rfl: 3   ROS:                                                                                                                                       History  unobtainable from patient due to lack of cooperation   Neurologic Examination:                                                                                                    Today's Vitals   07/19/15 1133 07/19/15 1137 07/19/15 1141  BP:  199/112   Pulse:  89   Temp:  97.5 F (36.4 C)   TempSrc:  Oral   Resp:  17   Weight:   124.9 kg (275 lb 5.7 oz)  SpO2:  100%    PainSc: 10-Worst pain ever      Evaluation of higher integrative functions including: Level of alertness: Alert, in severe distress from headache Oriented to time, place and person Speech: fluent, no evidence of dysarthria or aphasia noted.  Test the following cranial nerves: 2-12 grossly intact Motor examination: Normal tone, bulk, full 5/5 motor strength in all 4 extremities Examination of sensation : Normal and  symmetric sensation to pinprick in all 4 extremities and on face Examination of deep tendon reflexes: 2+, normal and symmetric in all extremities, normal plantars bilaterally Test coordination: Normal finger nose testing, with no evidence of limb appendicular ataxia or abnormal involuntary movements or tremors noted.   Lab Results: Basic Metabolic Panel:  Recent Labs Lab 07/19/15 1122  NA 140  K 4.4  CL 104  GLUCOSE 153*  BUN 18  CREATININE 1.40*    Liver Function Tests: No results for input(s): AST, ALT, ALKPHOS, BILITOT, PROT, ALBUMIN in the last 168 hours. No results for input(s): LIPASE, AMYLASE in the last 168 hours. No results for input(s): AMMONIA in the last 168 hours.  CBC:  Recent Labs Lab 07/19/15 1114 07/19/15 1122  WBC 5.9  --   NEUTROABS 3.2  --   HGB 13.7 16.0  HCT 42.2 47.0  MCV 85.9  --   PLT 177  --     Cardiac Enzymes: No results for input(s): CKTOTAL, CKMB, CKMBINDEX, TROPONINI in the last 168 hours.  Lipid Panel: No results for input(s): CHOL, TRIG, HDL, CHOLHDL, VLDL, LDLCALC in the last 168 hours.  CBG:  Recent Labs Lab 07/19/15 1137  GLUCAP 131*    Microbiology: No results found for this or any previous visit.   Imaging: Ct Head Code Stroke W/o Cm  07/19/2015  CLINICAL DATA:  Severe headache and right-sided weakness today. EXAM: CT HEAD WITHOUT CONTRAST TECHNIQUE: Contiguous axial images were obtained from the base of the skull through the vertex without intravenous contrast. COMPARISON:  Head CT 01/14/2015 and MRI  brain 01/14/2015 FINDINGS: Stable age related cerebral atrophy, ventriculomegaly and periventricular white matter disease. No findings for acute hemispheric infarction an or intracranial hemorrhage. No extra-axial fluid collections are identified. No mass lesions. The brainstem and cerebellum are grossly normal in stable. Vascular calcifications are noted. No acute bony findings. Extensive mucoperiosteal thickening involving the maxillary and ethmoid sinuses bilaterally. The mastoid air cells and middle ear cavities are clear. The globes are intact. IMPRESSION: No acute intracranial findings. Specifically, no findings for hemispheric infarction or intracranial hemorrhage. Stable age related cerebral atrophy and mild ventriculomegaly. Electronically Signed   By: Rudie Meyer M.D.   On: 07/19/2015 11:29     Stroke Scales   NIHSS :  0  [place] Stroke Program Early CT score (ASPECTS) : 10  Pre stroke Modified Rankin Scale (mRS): 0   Assessment and plan:   Alexander Gonzalez is an 65 y.o. male patient who presented with severe headache. Nonfocal neurological examination. No definite clinical evidence for an acute stroke. Hence do not recommend any IV TPA at this time. For further neurodiagnostic evaluation, recommend CTA   head and neck to rule out an intracranial aneurysm.  Headache management with Toradol, morphine, ordered Depacon 500 mg one dose, Solu-Medrol to 50 mg, baclofen 10 mg, Ativan 0.5 mg.  Discussed with ER physician. Patient being admitted to hospitalist service.   We'll follow-up

## 2015-07-19 NOTE — H&P (Signed)
History and Physical    Alexander Gonzalez:[id number] DOB: October 23, 1950 DOA: 07/19/2015  Referring MD/NP/PA: Bebe Shaggy / ER PCP: Katy Apo, MD  Outpatient Specialists: [facility] / Cardiology (last OV was in 2014) Patient coming from: Private residence  Chief Complaint: Intractable headache  HPI: Alexander Gonzalez is a 65 y.o. male with medical history significant for hypertension, stage III chronic kidney disease, dyslipidemia and gout who presented to the ER today with reports of abrupt onset of severe headache around 9 AM this morning with possible right-sided weakness but no other focal neurological deficits appreciated. He was initially sent in as a code stroke but his NIHSS was 0 therefore neurologist canceled code stroke. Initial CT of the head was unremarkable. Patient had previous presentation of severe headache in setting of accelerated hypertension in October 2016; an MRI of the brain done at that time was negative. The neurologist subsequently ordered a CT angiogram of the head that did reveal basilar circulatory stenosis and he i also consulted Dr. Titus Dubin of interventional radiology for recommendations regarding possible intervention. At time of my evaluation the patient was sleeping soundly after receiving multiple sedative medications to improve his headache and at the request of his wife I did not awaken him to obtain any history or perform a detailed neurological exam. According to the wife the patient was only minimally weak with the right hand but was primarily reporting severe headache. He had not had any fevers chills or other infectious symptoms. He apparently has returned to baseline according to his wife. Wife reports that since previous hospitalization patient has been compliant with all medications, he has not started any new medications.  ED Course:  Temp 97.5, initial BP 190/104-pulse 88-respirations 23, room air saturations 99% CT of the head without contrast: No  acute intracranial findings, stable age-related cerebral atrophy and mild ventriculomegaly CT angiogram in the head with and without contrast: Negative for large vessel occlusion, bulky soft plaque right carotid bifurcation resulting in a 65% stenosis of the right ICA origin; severe intracranial basilar artery atherosclerosis resulted in a beaded appearance of the vessel throughout its course but no hemodynamically significant basilar artery stenosis-severe stenosis of the left PCA origin with preserved distal flow and severe stenosis of the left ACA origin with adequately reconstituted a 2 segmental flow Lab data: Sodium 140, potassium 4.5, BUN 14, creatinine 1.44 with a GFR of 57, glucose 157, troponin 0.01, WBC 5900, hemoglobin 13.7, platelets 177,000 Toradol 15 mg IV 1 Zofran 4 mg IV 1 Labetalol 10 mg IV 1 Morphine 4 mg IV 2 doses Depacon 500 mg IV 1 Solu-Medrol 250 mg IV 1 Baclofen 10 mg PO 1 Ativan 0.5 mg IV 1  Review of Systems:  In addition to the HPI above,  No Fever-chills, myalgias or other constitutional symptoms No changes with Vision or hearing No problems swallowing food or Liquids, indigestion/reflux No Chest pain, Cough or Shortness of Breath, palpitations, orthopnea or DOE No Abdominal pain, N/V; no melena or hematochezia, no dark tarry stools, Bowel movements are regular, No dysuria, hematuria or flank pain No new skin rashes, lesions, masses or bruises, No new joints pains-aches No recent weight gain or loss No polyuria, polydypsia or polyphagia,   Past Medical History  Diagnosis Date  . Hypertension   . Osteoarthritis   . Mini stroke (HCC) 1996  . Hyperlipidemia     Past Surgical History  Procedure Laterality Date  . Knee surgery  1976    right  . Replacement total knee  1996    left  . Knee surgery  [phone]    right     reports that he has never smoked. He does not have any smokeless tobacco history on file. He reports that he drinks  alcohol/wine socially on the weekends. He reports that he does not use illicit drugs.  No Known Allergies  Family History  Problem Relation Age of Onset  . Cancer Mother   . Hypertension Mother   . Heart attack Father      Prior to Admission medications   Medication Sig Start Date End Date Taking? Authorizing Provider  amLODipine (NORVASC) 10 MG tablet Take 1 tablet (10 mg total) by mouth daily. 11/24/12  Yes Leone Brand, NP  aspirin EC 81 MG EC tablet Take 1 tablet (81 mg total) by mouth daily. 01/16/15  Yes Penny Pia, MD  losartan (COZAAR) 100 MG tablet Take 100 mg by mouth daily. 06/22/15  Yes Historical Provider, MD  simvastatin (ZOCOR) 40 MG tablet Take 0.5 tablets (20 mg total) by mouth every evening. Take 20 mg -half a tablet daily 11/24/12  Yes Leone Brand, NP  meclizine (ANTIVERT) 12.5 MG tablet Take 1 tablet (12.5 mg total) by mouth 2 (two) times daily as needed for dizziness. Patient not taking: Reported on 07/19/2015 01/16/15   Penny Pia, MD    Physical Exam: Filed Vitals:   07/19/15 1500 07/19/15 1515 07/19/15 1545 07/19/15 1617  BP: 191/92 173/79 174/98 189/110  Pulse: 75  78 80  Temp:    97.9 F (36.6 C)  TempSrc:    Oral  Resp: 13  10 12   Weight:      SpO2: 97% 97% 99% 98%      Constitutional: NAD, calm, comfortable Filed Vitals:   07/19/15 1500 07/19/15 1515 07/19/15 1545 07/19/15 1617  BP: 191/92 173/79 174/98 189/110  Pulse: 75  78 80  Temp:    97.9 F (36.6 C)  TempSrc:    Oral  Resp: 13  10 12   Weight:      SpO2: 97% 97% 99% 98%   Eyes: Exam deferred ENMT: Mucous membranes are moist.Exam limited due to patient's sleeping Neck: normal, supple, no masses, no thyromegaly Respiratory: clear to auscultation bilaterally, no wheezing, no crackles. Normal respiratory effort. No accessory muscle use.  Cardiovascular: Regular rate and rhythm, no murmurs / rubs / gallops. No extremity edema. 2+ pedal pulses. No carotid bruits.  Abdomen: no  tenderness, no masses palpated. No hepatosplenomegaly. Bowel sounds positive.  Musculoskeletal: no clubbing / cyanosis. No joint deformity upper and lower extremities, no contractures. Skin: no rashes, lesions, ulcers. No induration Neurologic: Exam deferred at wife's request quest due to patient's sleeping-extensive neurological exam previously completed by neurologist Psychiatric: Exam deferred at wife's request due to patient's sleeping   Labs on Admission: I have personally reviewed following labs and imaging studies  CBC:  Recent Labs Lab 07/19/15 1114 07/19/15 1122  WBC 5.9  --   NEUTROABS 3.2  --   HGB 13.7 16.0  HCT 42.2 47.0  MCV 85.9  --   PLT 177  --    Basic Metabolic Panel:  Recent Labs Lab 07/19/15 1114 07/19/15 1122  NA 140 140  K 4.5 4.4  CL 105 104  CO2 22  --   GLUCOSE 157* 153*  BUN 14 18  CREATININE 1.44* 1.40*  CALCIUM 9.2  --    GFR: CrCl cannot be calculated (Unknown ideal weight.). Liver Function Tests:  Recent Labs Lab  07/19/15 1114  AST 26  ALT 21  ALKPHOS 59  BILITOT 0.9  PROT 8.1  ALBUMIN 4.1   No results for input(s): LIPASE, AMYLASE in the last 168 hours. No results for input(s): AMMONIA in the last 168 hours. Coagulation Profile:  Recent Labs Lab 07/19/15 1114  INR 1.10   Cardiac Enzymes: No results for input(s): CKTOTAL, CKMB, CKMBINDEX, TROPONINI in the last 168 hours. BNP (last 3 results) No results for input(s): PROBNP in the last 8760 hours. HbA1C: No results for input(s): HGBA1C in the last 72 hours. CBG:  Recent Labs Lab 07/19/15 1137  GLUCAP 131*   Lipid Profile: No results for input(s): CHOL, HDL, LDLCALC, TRIG, CHOLHDL, LDLDIRECT in the last 72 hours. Thyroid Function Tests: No results for input(s): TSH, T4TOTAL, FREET4, T3FREE, THYROIDAB in the last 72 hours. Anemia Panel: No results for input(s): VITAMINB12, FOLATE, FERRITIN, TIBC, IRON, RETICCTPCT in the last 72 hours. Urine analysis:      Component Value Date/Time   COLORURINE AMBER* 01/14/2015 2036   APPEARANCEUR CLOUDY* 01/14/2015 2036   LABSPEC 1.019 01/14/2015 2036   PHURINE 5.5 01/14/2015 2036   GLUCOSEU NEGATIVE 01/14/2015 2036   HGBUR NEGATIVE 01/14/2015 2036   BILIRUBINUR MODERATE* 01/14/2015 2036   KETONESUR 15* 01/14/2015 2036   PROTEINUR 30* 01/14/2015 2036   UROBILINOGEN 1.0 01/14/2015 2036   NITRITE NEGATIVE 01/14/2015 2036   LEUKOCYTESUR NEGATIVE 01/14/2015 2036   Sepsis Labs: @LABRCNTIP (procalcitonin:4,lacticidven:4) )No results found for this or any previous visit (from the past 240 hour(s)).   Radiological Exams on Admission: Ct Angio Head W/cm &/or Wo Cm  07/19/2015  CLINICAL DATA:  65 year old male code stroke. Severe headache and weakness. Right side weakness. Initial encounter. EXAM: CT ANGIOGRAPHY HEAD AND NECK TECHNIQUE: Multidetector CT imaging of the head and neck was performed using the standard protocol during bolus administration of intravenous contrast. Multiplanar CT image reconstructions and MIPs were obtained to evaluate the vascular anatomy. Carotid stenosis measurements (when applicable) are obtained utilizing NASCET criteria, using the distal internal carotid diameter as the denominator. CONTRAST:  80 mL Isovue 370. COMPARISON:  Head CT without contrast 1123 hours today. Brain MRI 01/14/2015. FINDINGS: CTA NECK Skeleton: Intermittent poor dentition. Degenerative changes in the cervical and visualized thoracic spine. No acute osseous abnormality identified. Mild to moderate paranasal sinus mucosal thickening with chronic periosteal thickening suggesting chronic sinus disease. Other neck: Mediastinal lipomatosis. No superior mediastinal lymphadenopathy. Negative visualized lung apices aside from atelectasis. Negative thyroid, larynx, pharynx, parapharyngeal spaces, retropharyngeal space, sublingual space, submandibular glands and parotid glands. No cervical lymphadenopathy. Aortic arch: Bovine  arch configuration. Minimal to mild calcified arch atherosclerosis. No great vessel origin stenosis. Right carotid system: Mildly tortuous right CCA. Bulky soft and to a lesser extent calcified plaque at the right carotid bifurcation resulting in right ICA origin stenosis of 65 % with respect to the distal vessel. Otherwise negative cervical right ICA. Left carotid system: Tortuous proximal left CCA. Bovine type left CCA origin. At the left carotid bifurcation there is circumferential soft plaque at the left ICA origin, but less than 50 % stenosis with respect to the distal vessel results (series 9, image 119). Otherwise negative cervical left ICA. Vertebral arteries: No proximal right subclavian artery stenosis. Soft plaque with mild stenosis at the right vertebral artery origin (series 9, image 141). Dominant right vertebral artery otherwise is widely patent to the skullbase. No proximal left subclavian artery stenosis. Non dominant left vertebral artery with a normal origin. Tortuous left V1 segment. No left  vertebral artery stenosis to the skullbase. CTA HEAD Posterior circulation: Dominant right vertebral artery with V4 segment calcified plaque resulting an mild to moderate stenosis. Right PICA origin is patent. Non dominant left vertebral artery terminates in the left PICA. Calcified plaque at the basilar artery origin not resulting in stenosis, but there is widespread basilar artery irregularity related to soft atherosclerotic plaque such that a beaded appearance occurs throughout the basilar artery (series 12 images 27 and 28). No high-grade stenosis occurs. The basilar tip remains patent. SCA and MCA origins are patent, but there is moderate to severe stenosis at the left PCA origin. There is additional moderate tandem left P2 segment stenosis. Bilateral distal PCA branches remain patent. Anterior circulation: Soft plaque in the left ICA at the skullbase resulting in up to 50 % stenosis with respect to the  distal vessel. Left ICA siphon is patent with mild cavernous and moderate supraclinoid segment calcified plaque not resulting in stenosis. Patent left ICA terminus. Normal left ophthalmic artery origin. Mild cavernous and moderate supra clinoid right ICA calcified plaque without stenosis. Normal right ophthalmic artery origin. Patent right ICA terminus. Right MCA and ACA origins are within normal limits. Fenestrated anterior communicating artery is present. Bilateral A2 and distal ACA branches are within normal limits. Right MCA M1 segment, bifurcation, and right MCA branches are within normal limits. Left ACA origin is highly stenotic such that there is minimal if any flow in the left A1 segment. Still, the left A2 is adequately supplied by the anterior communicating artery. Left MCA origin, M1 segment, bifurcation, and left MCA branches are within normal limits. Venous sinuses: Patent. Anatomic variants: Dominant right vertebral artery. Left vertebral artery terminates in PICA. Bovine type arch configuration. IMPRESSION: 1. Negative for emergent large vessel occlusion. 2. Bulky soft plaque at the right carotid bifurcation resulting in 65% stenosis of the right ICA origin. 3. Severe intracranial basilar artery atherosclerosis, resulting in a beaded appearance of the vessel throughout its course, but no hemodynamically significant basilar artery stenosis. There is superimposed mild to moderate stenosis of the right vertebral artery V4 segment which supplies the basilar. The non dominant left vertebral artery terminates in the left PICA. 4. Severe stenosis at the left PCA origin with preserved distal flow. 5. Severe stenosis at the left ACA origin with adequately reconstituted A2 segment flow via the anterior communicating artery. 6. Other intracranial and extracranial atherosclerosis is not hemodynamically significant. Electronically Signed   By: Odessa FlemingH  Hall M.D.   On: 07/19/2015 12:51   Ct Angio Neck W/cm &/or  Wo/cm  07/19/2015  CLINICAL DATA:  65 year old male code stroke. Severe headache and weakness. Right side weakness. Initial encounter. EXAM: CT ANGIOGRAPHY HEAD AND NECK TECHNIQUE: Multidetector CT imaging of the head and neck was performed using the standard protocol during bolus administration of intravenous contrast. Multiplanar CT image reconstructions and MIPs were obtained to evaluate the vascular anatomy. Carotid stenosis measurements (when applicable) are obtained utilizing NASCET criteria, using the distal internal carotid diameter as the denominator. CONTRAST:  80 mL Isovue 370. COMPARISON:  Head CT without contrast 1123 hours today. Brain MRI 01/14/2015. FINDINGS: CTA NECK Skeleton: Intermittent poor dentition. Degenerative changes in the cervical and visualized thoracic spine. No acute osseous abnormality identified. Mild to moderate paranasal sinus mucosal thickening with chronic periosteal thickening suggesting chronic sinus disease. Other neck: Mediastinal lipomatosis. No superior mediastinal lymphadenopathy. Negative visualized lung apices aside from atelectasis. Negative thyroid, larynx, pharynx, parapharyngeal spaces, retropharyngeal space, sublingual space, submandibular glands and parotid  glands. No cervical lymphadenopathy. Aortic arch: Bovine arch configuration. Minimal to mild calcified arch atherosclerosis. No great vessel origin stenosis. Right carotid system: Mildly tortuous right CCA. Bulky soft and to a lesser extent calcified plaque at the right carotid bifurcation resulting in right ICA origin stenosis of 65 % with respect to the distal vessel. Otherwise negative cervical right ICA. Left carotid system: Tortuous proximal left CCA. Bovine type left CCA origin. At the left carotid bifurcation there is circumferential soft plaque at the left ICA origin, but less than 50 % stenosis with respect to the distal vessel results (series 9, image 119). Otherwise negative cervical left ICA.  Vertebral arteries: No proximal right subclavian artery stenosis. Soft plaque with mild stenosis at the right vertebral artery origin (series 9, image 141). Dominant right vertebral artery otherwise is widely patent to the skullbase. No proximal left subclavian artery stenosis. Non dominant left vertebral artery with a normal origin. Tortuous left V1 segment. No left vertebral artery stenosis to the skullbase. CTA HEAD Posterior circulation: Dominant right vertebral artery with V4 segment calcified plaque resulting an mild to moderate stenosis. Right PICA origin is patent. Non dominant left vertebral artery terminates in the left PICA. Calcified plaque at the basilar artery origin not resulting in stenosis, but there is widespread basilar artery irregularity related to soft atherosclerotic plaque such that a beaded appearance occurs throughout the basilar artery (series 12 images 27 and 28). No high-grade stenosis occurs. The basilar tip remains patent. SCA and MCA origins are patent, but there is moderate to severe stenosis at the left PCA origin. There is additional moderate tandem left P2 segment stenosis. Bilateral distal PCA branches remain patent. Anterior circulation: Soft plaque in the left ICA at the skullbase resulting in up to 50 % stenosis with respect to the distal vessel. Left ICA siphon is patent with mild cavernous and moderate supraclinoid segment calcified plaque not resulting in stenosis. Patent left ICA terminus. Normal left ophthalmic artery origin. Mild cavernous and moderate supra clinoid right ICA calcified plaque without stenosis. Normal right ophthalmic artery origin. Patent right ICA terminus. Right MCA and ACA origins are within normal limits. Fenestrated anterior communicating artery is present. Bilateral A2 and distal ACA branches are within normal limits. Right MCA M1 segment, bifurcation, and right MCA branches are within normal limits. Left ACA origin is highly stenotic such that  there is minimal if any flow in the left A1 segment. Still, the left A2 is adequately supplied by the anterior communicating artery. Left MCA origin, M1 segment, bifurcation, and left MCA branches are within normal limits. Venous sinuses: Patent. Anatomic variants: Dominant right vertebral artery. Left vertebral artery terminates in PICA. Bovine type arch configuration. IMPRESSION: 1. Negative for emergent large vessel occlusion. 2. Bulky soft plaque at the right carotid bifurcation resulting in 65% stenosis of the right ICA origin. 3. Severe intracranial basilar artery atherosclerosis, resulting in a beaded appearance of the vessel throughout its course, but no hemodynamically significant basilar artery stenosis. There is superimposed mild to moderate stenosis of the right vertebral artery V4 segment which supplies the basilar. The non dominant left vertebral artery terminates in the left PICA. 4. Severe stenosis at the left PCA origin with preserved distal flow. 5. Severe stenosis at the left ACA origin with adequately reconstituted A2 segment flow via the anterior communicating artery. 6. Other intracranial and extracranial atherosclerosis is not hemodynamically significant. Electronically Signed   By: Odessa Fleming M.D.   On: 07/19/2015 12:51   Ct Head  Code Stroke W/o Cm  07/19/2015  CLINICAL DATA:  Severe headache and right-sided weakness today. EXAM: CT HEAD WITHOUT CONTRAST TECHNIQUE: Contiguous axial images were obtained from the base of the skull through the vertex without intravenous contrast. COMPARISON:  Head CT 01/14/2015 and MRI brain 01/14/2015 FINDINGS: Stable age related cerebral atrophy, ventriculomegaly and periventricular white matter disease. No findings for acute hemispheric infarction an or intracranial hemorrhage. No extra-axial fluid collections are identified. No mass lesions. The brainstem and cerebellum are grossly normal in stable. Vascular calcifications are noted. No acute bony findings.  Extensive mucoperiosteal thickening involving the maxillary and ethmoid sinuses bilaterally. The mastoid air cells and middle ear cavities are clear. The globes are intact. IMPRESSION: No acute intracranial findings. Specifically, no findings for hemispheric infarction or intracranial hemorrhage. Stable age related cerebral atrophy and mild ventriculomegaly. Electronically Signed   By: Rudie Meyer M.D.   On: 07/19/2015 11:29    EKG: (Independently reviewed) sinus rhythm with ventricular rate 92 bpm, QTC 449 ms, elevated J-point in lateral leads but no definitive ischemic changes and unchanged from previous EKG  Assessment/Plan Principal Problem:   Severe headache/ Abnormal computed tomography angiography of brain -Patient presented with severe headache transient right sided weakness (primarily hand) and elevated blood pressure and reported compliance with medications-imaging reveals basilar circulatory abnormalities-CT head without acute stroke and NIHSS was 0 -Appreciate neurology assistance -IR has evaluated patient and plans are to initially pursue cerebral arteriogram with possibility of pursuing intracranial vascular intervention if indicated (concern over possible aneurysm) -According to staff and patient's wife patient has returned to baseline and has no further weakness on exam -Patient was given a cocktail of Depacon, steroids, Toradol and morphine in the ER for his headache; uncertain if continued IV steroids would be beneficial-we'll defer to neurology  Active Problems:   HTN (hypertension) -Currently poorly controlled -Continue preadmission Norvasc and Cozaar -Hydralazine prn -Obtain baseline echocardiogram    CKD (chronic kidney disease), stage III -Likely related to hypertension -Obtain urinalysis to check for proteinuria    Hyperlipidemia -Continue preadmission Zocor    Acute hyperglycemia -Check hemoglobin A1c      DVT prophylaxis: Lovenox Code Status: Full code   Family Communication: Spouse, Roe Rutherford at the bedside Disposition Plan: Return to preadmission home environment Consults called: Nandagam / Neurology; Devashwar / IR  Admission status: Observation Mobility: Without assistive devices   ELLIS,ALLISON L. ANP-BC [facility] Pager [phone]   If 7PM-7AM, please contact night-coverage [email or web address] Password [facility]  07/19/2015, 4:48 PM

## 2015-07-19 NOTE — Consult Note (Signed)
Chief Complaint: Patient was seen in consultation today for cerebral arteriogram Chief Complaint  Patient presents with  . Code Stroke   at the request of Dr Patsey Bertholdam Nandigam  Referring Physician(s): Dr Patsey Bertholdam Nandigam  Supervising Physician: Julieanne Cottoneveshwar, Sanjeev  History of Present Illness: Alexander Mandrildward Krah is a 65 y.o. male   Severe headache this am Suffered similar symptoms 12/2014; came to ED Released with follow up to [facility] Followed for BP meds and Cholesterol meds  Code Stroke while in route to ED 07/19/15 Numbness Rt hand/foot   CT Head Code Stroke:IMPRESSION: No acute intracranial findings. Specifically, no findings for hemispheric infarction or intracranial hemorrhage.  CTA Head/Neck: IMPRESSION: 1. Negative for emergent large vessel occlusion. 2. Bulky soft plaque at the right carotid bifurcation resulting in 65% stenosis of the right ICA origin. 3. Severe intracranial basilar artery atherosclerosis, resulting in a beaded appearance of the vessel throughout its course, but no hemodynamically significant basilar artery stenosis. There is superimposed mild to moderate stenosis of the right vertebral artery V4 segment which supplies the basilar. The non dominant left vertebral artery terminates in the left PICA. 4. Severe stenosis at the left PCA origin with preserved distal flow. 5. Severe stenosis at the left ACA origin with adequately reconstituted A2 segment flow via the anterior communicating artery. 6. Other intracranial and extracranial atherosclerosis is not hemodynamically significant.  Request for cerebral arteriogram per Dr Lavon PaganiniNandigam Dr Corliss Skainseveshwar aware of pt and has reviewed imaging Now scheduled for procedure 4/21 in IR  Past Medical History  Diagnosis Date  . Hypertension   . Osteoarthritis   . Mini stroke (HCC) 1996  . Hyperlipidemia     Past Surgical History  Procedure Laterality Date  . Knee surgery  1976    right  . Replacement total knee   1996    left  . Knee surgery  1980'2    right    Allergies: Review of patient's allergies indicates no known allergies.  Medications: Prior to Admission medications   Medication Sig Start Date End Date Taking? Authorizing Provider  amLODipine (NORVASC) 10 MG tablet Take 1 tablet (10 mg total) by mouth daily. 11/24/12  Yes Leone BrandLaura R Ingold, NP  aspirin EC 81 MG EC tablet Take 1 tablet (81 mg total) by mouth daily. 01/16/15  Yes Penny Piarlando Vega, MD  losartan (COZAAR) 100 MG tablet Take 100 mg by mouth daily. 06/22/15  Yes Historical Provider, MD  simvastatin (ZOCOR) 40 MG tablet Take 0.5 tablets (20 mg total) by mouth every evening. Take 20 mg -half a tablet daily 11/24/12  Yes Leone BrandLaura R Ingold, NP  meclizine (ANTIVERT) 12.5 MG tablet Take 1 tablet (12.5 mg total) by mouth 2 (two) times daily as needed for dizziness. Patient not taking: Reported on 07/19/2015 01/16/15   Penny Piarlando Vega, MD     Family History  Problem Relation Age of Onset  . Cancer Mother   . Hypertension Mother   . Heart attack Father     Social History   Social History  . Marital Status: Divorced    Spouse Name: N/A  . Number of Children: N/A  . Years of Education: N/A   Social History Main Topics  . Smoking status: Never Smoker   . Smokeless tobacco: None  . Alcohol Use: Yes     Comment: occas.  . Drug Use: No  . Sexual Activity: Yes   Other Topics Concern  . None   Social History Narrative    Review of Systems: A  12 point ROS discussed and pertinent positives are indicated in the HPI above.  All other systems are negative.  Review of Systems  Constitutional: Positive for activity change and fatigue. Negative for fever.  HENT: Negative for tinnitus and trouble swallowing.   Eyes: Negative for visual disturbance.  Respiratory: Negative for cough and shortness of breath.   Cardiovascular: Negative for chest pain.  Neurological: Positive for dizziness, weakness, numbness and headaches. Negative for tremors,  seizures, syncope, facial asymmetry, speech difficulty and light-headedness.  Psychiatric/Behavioral: Negative for behavioral problems and confusion.    Vital Signs: BP 176/94 mmHg  Pulse 70  Temp(Src) 97.5 F (36.4 C) (Oral)  Resp 16  Wt 275 lb 5.7 oz (124.9 kg)  SpO2 99%  Physical Exam  Constitutional: He is oriented to person, place, and time. He appears well-nourished.  HENT:  Head: Atraumatic.  Cardiovascular: Normal rate and regular rhythm.   Pulmonary/Chest: Effort normal.  Abdominal: Soft. Bowel sounds are normal.  Musculoskeletal: Normal range of motion.  Rt weakness  Neurological: He is alert and oriented to person, place, and time.  Can answer questions Sedated now  Skin: Skin is warm and dry.  Psychiatric: He has a normal mood and affect. His behavior is normal. Judgment and thought content normal.  consent signed with wife at bedside  Nursing note and vitals reviewed.   Mallampati Score:  MD Evaluation Airway: WNL Heart: WNL Abdomen: WNL Chest/ Lungs: WNL ASA  Classification: 3 Mallampati/Airway Score: Two  Imaging: Ct Angio Head W/cm &/or Wo Cm  07/19/2015  CLINICAL DATA:  65 year old male code stroke. Severe headache and weakness. Right side weakness. Initial encounter. EXAM: CT ANGIOGRAPHY HEAD AND NECK TECHNIQUE: Multidetector CT imaging of the head and neck was performed using the standard protocol during bolus administration of intravenous contrast. Multiplanar CT image reconstructions and MIPs were obtained to evaluate the vascular anatomy. Carotid stenosis measurements (when applicable) are obtained utilizing NASCET criteria, using the distal internal carotid diameter as the denominator. CONTRAST:  80 mL Isovue 370. COMPARISON:  Head CT without contrast 1123 hours today. Brain MRI 01/14/2015. FINDINGS: CTA NECK Skeleton: Intermittent poor dentition. Degenerative changes in the cervical and visualized thoracic spine. No acute osseous abnormality  identified. Mild to moderate paranasal sinus mucosal thickening with chronic periosteal thickening suggesting chronic sinus disease. Other neck: Mediastinal lipomatosis. No superior mediastinal lymphadenopathy. Negative visualized lung apices aside from atelectasis. Negative thyroid, larynx, pharynx, parapharyngeal spaces, retropharyngeal space, sublingual space, submandibular glands and parotid glands. No cervical lymphadenopathy. Aortic arch: Bovine arch configuration. Minimal to mild calcified arch atherosclerosis. No great vessel origin stenosis. Right carotid system: Mildly tortuous right CCA. Bulky soft and to a lesser extent calcified plaque at the right carotid bifurcation resulting in right ICA origin stenosis of 65 % with respect to the distal vessel. Otherwise negative cervical right ICA. Left carotid system: Tortuous proximal left CCA. Bovine type left CCA origin. At the left carotid bifurcation there is circumferential soft plaque at the left ICA origin, but less than 50 % stenosis with respect to the distal vessel results (series 9, image 119). Otherwise negative cervical left ICA. Vertebral arteries: No proximal right subclavian artery stenosis. Soft plaque with mild stenosis at the right vertebral artery origin (series 9, image 141). Dominant right vertebral artery otherwise is widely patent to the skullbase. No proximal left subclavian artery stenosis. Non dominant left vertebral artery with a normal origin. Tortuous left V1 segment. No left vertebral artery stenosis to the skullbase. CTA HEAD Posterior circulation:  Dominant right vertebral artery with V4 segment calcified plaque resulting an mild to moderate stenosis. Right PICA origin is patent. Non dominant left vertebral artery terminates in the left PICA. Calcified plaque at the basilar artery origin not resulting in stenosis, but there is widespread basilar artery irregularity related to soft atherosclerotic plaque such that a beaded  appearance occurs throughout the basilar artery (series 12 images 27 and 28). No high-grade stenosis occurs. The basilar tip remains patent. SCA and MCA origins are patent, but there is moderate to severe stenosis at the left PCA origin. There is additional moderate tandem left P2 segment stenosis. Bilateral distal PCA branches remain patent. Anterior circulation: Soft plaque in the left ICA at the skullbase resulting in up to 50 % stenosis with respect to the distal vessel. Left ICA siphon is patent with mild cavernous and moderate supraclinoid segment calcified plaque not resulting in stenosis. Patent left ICA terminus. Normal left ophthalmic artery origin. Mild cavernous and moderate supra clinoid right ICA calcified plaque without stenosis. Normal right ophthalmic artery origin. Patent right ICA terminus. Right MCA and ACA origins are within normal limits. Fenestrated anterior communicating artery is present. Bilateral A2 and distal ACA branches are within normal limits. Right MCA M1 segment, bifurcation, and right MCA branches are within normal limits. Left ACA origin is highly stenotic such that there is minimal if any flow in the left A1 segment. Still, the left A2 is adequately supplied by the anterior communicating artery. Left MCA origin, M1 segment, bifurcation, and left MCA branches are within normal limits. Venous sinuses: Patent. Anatomic variants: Dominant right vertebral artery. Left vertebral artery terminates in PICA. Bovine type arch configuration. IMPRESSION: 1. Negative for emergent large vessel occlusion. 2. Bulky soft plaque at the right carotid bifurcation resulting in 65% stenosis of the right ICA origin. 3. Severe intracranial basilar artery atherosclerosis, resulting in a beaded appearance of the vessel throughout its course, but no hemodynamically significant basilar artery stenosis. There is superimposed mild to moderate stenosis of the right vertebral artery V4 segment which supplies  the basilar. The non dominant left vertebral artery terminates in the left PICA. 4. Severe stenosis at the left PCA origin with preserved distal flow. 5. Severe stenosis at the left ACA origin with adequately reconstituted A2 segment flow via the anterior communicating artery. 6. Other intracranial and extracranial atherosclerosis is not hemodynamically significant. Electronically Signed   By: Odessa Fleming M.D.   On: 07/19/2015 12:51   Ct Angio Neck W/cm &/or Wo/cm  07/19/2015  CLINICAL DATA:  65 year old male code stroke. Severe headache and weakness. Right side weakness. Initial encounter. EXAM: CT ANGIOGRAPHY HEAD AND NECK TECHNIQUE: Multidetector CT imaging of the head and neck was performed using the standard protocol during bolus administration of intravenous contrast. Multiplanar CT image reconstructions and MIPs were obtained to evaluate the vascular anatomy. Carotid stenosis measurements (when applicable) are obtained utilizing NASCET criteria, using the distal internal carotid diameter as the denominator. CONTRAST:  80 mL Isovue 370. COMPARISON:  Head CT without contrast 1123 hours today. Brain MRI 01/14/2015. FINDINGS: CTA NECK Skeleton: Intermittent poor dentition. Degenerative changes in the cervical and visualized thoracic spine. No acute osseous abnormality identified. Mild to moderate paranasal sinus mucosal thickening with chronic periosteal thickening suggesting chronic sinus disease. Other neck: Mediastinal lipomatosis. No superior mediastinal lymphadenopathy. Negative visualized lung apices aside from atelectasis. Negative thyroid, larynx, pharynx, parapharyngeal spaces, retropharyngeal space, sublingual space, submandibular glands and parotid glands. No cervical lymphadenopathy. Aortic arch: Bovine arch configuration. Minimal  to mild calcified arch atherosclerosis. No great vessel origin stenosis. Right carotid system: Mildly tortuous right CCA. Bulky soft and to a lesser extent calcified plaque  at the right carotid bifurcation resulting in right ICA origin stenosis of 65 % with respect to the distal vessel. Otherwise negative cervical right ICA. Left carotid system: Tortuous proximal left CCA. Bovine type left CCA origin. At the left carotid bifurcation there is circumferential soft plaque at the left ICA origin, but less than 50 % stenosis with respect to the distal vessel results (series 9, image 119). Otherwise negative cervical left ICA. Vertebral arteries: No proximal right subclavian artery stenosis. Soft plaque with mild stenosis at the right vertebral artery origin (series 9, image 141). Dominant right vertebral artery otherwise is widely patent to the skullbase. No proximal left subclavian artery stenosis. Non dominant left vertebral artery with a normal origin. Tortuous left V1 segment. No left vertebral artery stenosis to the skullbase. CTA HEAD Posterior circulation: Dominant right vertebral artery with V4 segment calcified plaque resulting an mild to moderate stenosis. Right PICA origin is patent. Non dominant left vertebral artery terminates in the left PICA. Calcified plaque at the basilar artery origin not resulting in stenosis, but there is widespread basilar artery irregularity related to soft atherosclerotic plaque such that a beaded appearance occurs throughout the basilar artery (series 12 images 27 and 28). No high-grade stenosis occurs. The basilar tip remains patent. SCA and MCA origins are patent, but there is moderate to severe stenosis at the left PCA origin. There is additional moderate tandem left P2 segment stenosis. Bilateral distal PCA branches remain patent. Anterior circulation: Soft plaque in the left ICA at the skullbase resulting in up to 50 % stenosis with respect to the distal vessel. Left ICA siphon is patent with mild cavernous and moderate supraclinoid segment calcified plaque not resulting in stenosis. Patent left ICA terminus. Normal left ophthalmic artery  origin. Mild cavernous and moderate supra clinoid right ICA calcified plaque without stenosis. Normal right ophthalmic artery origin. Patent right ICA terminus. Right MCA and ACA origins are within normal limits. Fenestrated anterior communicating artery is present. Bilateral A2 and distal ACA branches are within normal limits. Right MCA M1 segment, bifurcation, and right MCA branches are within normal limits. Left ACA origin is highly stenotic such that there is minimal if any flow in the left A1 segment. Still, the left A2 is adequately supplied by the anterior communicating artery. Left MCA origin, M1 segment, bifurcation, and left MCA branches are within normal limits. Venous sinuses: Patent. Anatomic variants: Dominant right vertebral artery. Left vertebral artery terminates in PICA. Bovine type arch configuration. IMPRESSION: 1. Negative for emergent large vessel occlusion. 2. Bulky soft plaque at the right carotid bifurcation resulting in 65% stenosis of the right ICA origin. 3. Severe intracranial basilar artery atherosclerosis, resulting in a beaded appearance of the vessel throughout its course, but no hemodynamically significant basilar artery stenosis. There is superimposed mild to moderate stenosis of the right vertebral artery V4 segment which supplies the basilar. The non dominant left vertebral artery terminates in the left PICA. 4. Severe stenosis at the left PCA origin with preserved distal flow. 5. Severe stenosis at the left ACA origin with adequately reconstituted A2 segment flow via the anterior communicating artery. 6. Other intracranial and extracranial atherosclerosis is not hemodynamically significant. Electronically Signed   By: Odessa Fleming M.D.   On: 07/19/2015 12:51   Ct Head Code Stroke W/o Cm  07/19/2015  CLINICAL DATA:  Severe headache and right-sided weakness today. EXAM: CT HEAD WITHOUT CONTRAST TECHNIQUE: Contiguous axial images were obtained from the base of the skull through the  vertex without intravenous contrast. COMPARISON:  Head CT 01/14/2015 and MRI brain 01/14/2015 FINDINGS: Stable age related cerebral atrophy, ventriculomegaly and periventricular white matter disease. No findings for acute hemispheric infarction an or intracranial hemorrhage. No extra-axial fluid collections are identified. No mass lesions. The brainstem and cerebellum are grossly normal in stable. Vascular calcifications are noted. No acute bony findings. Extensive mucoperiosteal thickening involving the maxillary and ethmoid sinuses bilaterally. The mastoid air cells and middle ear cavities are clear. The globes are intact. IMPRESSION: No acute intracranial findings. Specifically, no findings for hemispheric infarction or intracranial hemorrhage. Stable age related cerebral atrophy and mild ventriculomegaly. Electronically Signed   By: Rudie Meyer M.D.   On: 07/19/2015 11:29    Labs:  CBC:  Recent Labs  01/14/15 1247 01/14/15 1254 07/19/15 1114 07/19/15 1122  WBC 5.2  --  5.9  --   HGB 16.7 18.7* 13.7 16.0  HCT 49.8 55.0* 42.2 47.0  PLT 226  --  177  --     COAGS:  Recent Labs  01/14/15 1247 07/19/15 1114  INR 1.15 1.10  APTT 33 29    BMP:  Recent Labs  01/14/15 1247 01/14/15 1254 01/15/15 0438 07/19/15 1114 07/19/15 1122  NA 135 140 136 140 140  K 3.8 3.8 3.8 4.5 4.4  CL 98* 102 102 105 104  CO2 23  --  25 22  --   GLUCOSE 122* 124* 97 157* 153*  BUN 14 19 21* 14 18  CALCIUM 9.8  --  9.0 9.2  --   CREATININE 1.50* 1.50* 1.36* 1.44* 1.40*  GFRNONAA 47*  --  53* 50*  --   GFRAA 55*  --  >60 57*  --     LIVER FUNCTION TESTS:  Recent Labs  01/14/15 1247 07/19/15 1114  BILITOT 1.2 0.9  AST 24 26  ALT 18 21  ALKPHOS 63 59  PROT 8.7* 8.1  ALBUMIN 4.2 4.1    TUMOR MARKERS: No results for input(s): AFPTM, CEA, CA199, CHROMGRNA in the last 8760 hours.  Assessment and Plan:  HTN Severe headache Called ambulance and Code Stroke en route Work up reveals  no CVA; + basilar artery stenosis Now scheduled for cerebral arteriogram 4/21 Risks and Benefits discussed with the patient and wife including, but not limited to bleeding, infection, vascular injury, contrast induced renal failure, stroke or even death. All of the patient's wife's questions were answered, they agreeable to proceed. Consent signed and in chart.  Thank you for this interesting consult.  I greatly enjoyed meeting Alexander Gonzalez and look forward to participating in their care.  A copy of this report was sent to the requesting provider on this date.  Electronically Signed: Ralene Muskrat A 07/19/2015, 2:27 PM   I spent a total of 40 Minutes    in face to face in clinical consultation, greater than 50% of which was counseling/coordinating care for cerebral arteriogram

## 2015-07-20 ENCOUNTER — Observation Stay (HOSPITAL_COMMUNITY): Payer: Medicare Other

## 2015-07-20 ENCOUNTER — Other Ambulatory Visit (HOSPITAL_COMMUNITY): Payer: Managed Care, Other (non HMO)

## 2015-07-20 DIAGNOSIS — R739 Hyperglycemia, unspecified: Secondary | ICD-10-CM | POA: Diagnosis present

## 2015-07-20 DIAGNOSIS — I651 Occlusion and stenosis of basilar artery: Secondary | ICD-10-CM

## 2015-07-20 DIAGNOSIS — R938 Abnormal findings on diagnostic imaging of other specified body structures: Secondary | ICD-10-CM

## 2015-07-20 DIAGNOSIS — G44311 Acute post-traumatic headache, intractable: Secondary | ICD-10-CM | POA: Diagnosis not present

## 2015-07-20 DIAGNOSIS — G44319 Acute post-traumatic headache, not intractable: Secondary | ICD-10-CM | POA: Diagnosis not present

## 2015-07-20 DIAGNOSIS — E785 Hyperlipidemia, unspecified: Secondary | ICD-10-CM | POA: Diagnosis present

## 2015-07-20 DIAGNOSIS — Z8673 Personal history of transient ischemic attack (TIA), and cerebral infarction without residual deficits: Secondary | ICD-10-CM | POA: Diagnosis not present

## 2015-07-20 DIAGNOSIS — R0789 Other chest pain: Secondary | ICD-10-CM | POA: Diagnosis present

## 2015-07-20 DIAGNOSIS — R531 Weakness: Secondary | ICD-10-CM | POA: Diagnosis present

## 2015-07-20 DIAGNOSIS — I129 Hypertensive chronic kidney disease with stage 1 through stage 4 chronic kidney disease, or unspecified chronic kidney disease: Secondary | ICD-10-CM | POA: Diagnosis present

## 2015-07-20 DIAGNOSIS — Z7982 Long term (current) use of aspirin: Secondary | ICD-10-CM | POA: Diagnosis not present

## 2015-07-20 DIAGNOSIS — N179 Acute kidney failure, unspecified: Secondary | ICD-10-CM | POA: Diagnosis present

## 2015-07-20 DIAGNOSIS — R51 Headache: Secondary | ICD-10-CM | POA: Diagnosis present

## 2015-07-20 DIAGNOSIS — G43919 Migraine, unspecified, intractable, without status migrainosus: Secondary | ICD-10-CM | POA: Diagnosis present

## 2015-07-20 DIAGNOSIS — I161 Hypertensive emergency: Secondary | ICD-10-CM | POA: Diagnosis present

## 2015-07-20 DIAGNOSIS — I10 Essential (primary) hypertension: Secondary | ICD-10-CM | POA: Diagnosis not present

## 2015-07-20 DIAGNOSIS — R7309 Other abnormal glucose: Secondary | ICD-10-CM | POA: Diagnosis not present

## 2015-07-20 DIAGNOSIS — N183 Chronic kidney disease, stage 3 (moderate): Secondary | ICD-10-CM | POA: Diagnosis present

## 2015-07-20 LAB — URINE MICROSCOPIC-ADD ON
Bacteria, UA: NONE SEEN
RBC / HPF: NONE SEEN RBC/hpf (ref 0–5)

## 2015-07-20 LAB — BASIC METABOLIC PANEL
ANION GAP: 12 (ref 5–15)
BUN: 20 mg/dL (ref 6–20)
CHLORIDE: 101 mmol/L (ref 101–111)
CO2: 23 mmol/L (ref 22–32)
Calcium: 9.2 mg/dL (ref 8.9–10.3)
Creatinine, Ser: 1.27 mg/dL — ABNORMAL HIGH (ref 0.61–1.24)
GFR calc Af Amer: >60 mL/min (ref >60)
GFR, EST NON AFRICAN AMERICAN: 58 mL/min — AB (ref >60)
GLUCOSE: 147 mg/dL — AB (ref 65–99)
POTASSIUM: 4.4 mmol/L (ref 3.5–5.1)
Sodium: 136 mmol/L (ref 135–145)

## 2015-07-20 LAB — URINALYSIS, ROUTINE W REFLEX MICROSCOPIC
BILIRUBIN URINE: NEGATIVE
Glucose, UA: NEGATIVE mg/dL
HGB URINE DIPSTICK: NEGATIVE
KETONES UR: NEGATIVE mg/dL
Leukocytes, UA: NEGATIVE
Nitrite: NEGATIVE
PH: 6.5 (ref 5.0–8.0)
Protein, ur: 30 mg/dL — AB
SPECIFIC GRAVITY, URINE: 1.029 (ref 1.005–1.030)

## 2015-07-20 LAB — CBC
HCT: 43.4 % (ref 39.0–52.0)
HEMOGLOBIN: 14.2 g/dL (ref 13.0–17.0)
MCH: 28 pg (ref 26.0–34.0)
MCHC: 32.7 g/dL (ref 30.0–36.0)
MCV: 85.4 fL (ref 78.0–100.0)
PLATELETS: 218 10*3/uL (ref 150–400)
RBC: 5.08 MIL/uL (ref 4.22–5.81)
RDW: 13.5 % (ref 11.5–15.5)
WBC: 11.5 10*3/uL — AB (ref 4.0–10.5)

## 2015-07-20 LAB — LIPID PANEL
CHOL/HDL RATIO: 3.6 ratio
CHOLESTEROL: 200 mg/dL (ref 0–200)
HDL: 55 mg/dL (ref >40)
LDL Cholesterol: 122 mg/dL — ABNORMAL HIGH (ref 0–99)
Triglycerides: 116 mg/dL (ref <150)
VLDL: 23 mg/dL (ref 0–40)

## 2015-07-20 LAB — TROPONIN I

## 2015-07-20 LAB — D-DIMER, QUANTITATIVE (NOT AT ARMC)

## 2015-07-20 MED ORDER — ALUM & MAG HYDROXIDE-SIMETH 200-200-20 MG/5ML PO SUSP: 15.0000 mL | Freq: Four times a day (QID) | ORAL | Status: DC | PRN

## 2015-07-20 MED ORDER — LIDOCAINE HCL 1 % IJ SOLN
INTRAMUSCULAR | Status: AC
  Filled 2015-07-20: qty 20

## 2015-07-20 MED ORDER — IOPAMIDOL (ISOVUE-300) INJECTION 61%
INTRAVENOUS | Status: AC
  Filled 2015-07-20: qty 150

## 2015-07-20 MED ORDER — HYDRALAZINE HCL 20 MG/ML IJ SOLN
INTRAMUSCULAR | Status: AC
  Filled 2015-07-20: qty 1

## 2015-07-20 MED ORDER — FENTANYL CITRATE (PF) 100 MCG/2ML IJ SOLN
INTRAMUSCULAR | Status: DC | PRN
  Administered 2015-07-20: 25 ug via INTRAVENOUS

## 2015-07-20 MED ORDER — ONDANSETRON HCL 4 MG/2ML IJ SOLN
4.0000 mg | Freq: Four times a day (QID) | INTRAMUSCULAR | Status: DC | PRN
  Administered 2015-07-20 – 2015-07-25 (×5): 4 mg via INTRAVENOUS
  Filled 2015-07-20 (×5): qty 2

## 2015-07-20 MED ORDER — BACLOFEN 10 MG PO TABS
10.0000 mg | ORAL_TABLET | Freq: Three times a day (TID) | ORAL | Status: DC
  Administered 2015-07-20 – 2015-07-21 (×3): 10 mg via ORAL
  Filled 2015-07-20 (×3): qty 1

## 2015-07-20 MED ORDER — METHYLPREDNISOLONE SODIUM SUCC 125 MG IJ SOLR
125.0000 mg | Freq: Four times a day (QID) | INTRAMUSCULAR | Status: DC
  Administered 2015-07-20 – 2015-07-21 (×4): 125 mg via INTRAVENOUS
  Filled 2015-07-20 (×4): qty 2

## 2015-07-20 MED ORDER — HYDROMORPHONE HCL 1 MG/ML IJ SOLN
1.0000 mg | INTRAMUSCULAR | Status: DC | PRN
  Administered 2015-07-20 – 2015-07-24 (×6): 1 mg via INTRAVENOUS
  Filled 2015-07-20 (×6): qty 1

## 2015-07-20 MED ORDER — CLONAZEPAM 1 MG PO TABS
1.0000 mg | ORAL_TABLET | Freq: Every day | ORAL | Status: AC
  Administered 2015-07-20: 1 mg via ORAL
  Filled 2015-07-20: qty 1

## 2015-07-20 MED ORDER — PANTOPRAZOLE SODIUM 40 MG IV SOLR
40.0000 mg | INTRAVENOUS | Status: DC
  Administered 2015-07-20 – 2015-07-22 (×3): 40 mg via INTRAVENOUS
  Filled 2015-07-20 (×3): qty 40

## 2015-07-20 MED ORDER — METHYLPREDNISOLONE SODIUM SUCC 125 MG IJ SOLR
80.0000 mg | Freq: Once | INTRAMUSCULAR | Status: AC
  Administered 2015-07-20: 62.5 mg via INTRAVENOUS
  Filled 2015-07-20: qty 2

## 2015-07-20 MED ORDER — FENTANYL CITRATE (PF) 100 MCG/2ML IJ SOLN
INTRAMUSCULAR | Status: AC
  Filled 2015-07-20: qty 2

## 2015-07-20 MED ORDER — HYDRALAZINE HCL 20 MG/ML IJ SOLN
INTRAMUSCULAR | Status: DC | PRN
  Administered 2015-07-20: 5 mg via INTRAVENOUS

## 2015-07-20 MED ORDER — MIDAZOLAM HCL 2 MG/2ML IJ SOLN
INTRAMUSCULAR | Status: AC
  Filled 2015-07-20: qty 2

## 2015-07-20 MED ORDER — DEXTROSE 5 % IV SOLN
250.0000 mg | Freq: Four times a day (QID) | INTRAVENOUS | Status: DC
  Administered 2015-07-20 – 2015-07-21 (×5): 250 mg via INTRAVENOUS
  Filled 2015-07-20 (×7): qty 2.5

## 2015-07-20 MED ORDER — ALUM & MAG HYDROXIDE-SIMETH 200-200-20 MG/5 ML NICU TOPICAL: 1.0000 "application " | Freq: Four times a day (QID) | TOPICAL | Status: DC | PRN

## 2015-07-20 NOTE — Progress Notes (Signed)
PT Cancellation Note  Patient Details Name: Alexander Gonzalez MRN: [id number] DOB: 08/22/1950   Cancelled Treatment:    Reason Eval/Treat Not Completed: Medical issues which prohibited therapy.  Awaiting arteriogram w/ possible intervention.  Additionally, RN reports pt nauseous and dizzy w/ any mobility.  Will hold PT until pt more medically appropriate.  Encarnacion ChuAshley Heaton Sarin PT, DPT  Pager: [phone] Phone: [phone] 07/20/2015, 9:30 AM

## 2015-07-20 NOTE — Progress Notes (Signed)
Called placed and message left for Dr. Izola PriceMyers to return call to writer.

## 2015-07-20 NOTE — Care Management Note (Signed)
Case Management Note  Patient Details  Name: Alexander Gonzalez MRN: [id number] Date of Birth: 10/23/1950  Subjective/Objective:     Patient admitted with severe Headache. He is from home with his spouse.                Action/Plan: Arteriogram today and awaiting PT/OT recs. CM following for discharge disposition.   Expected Discharge Date:                  Expected Discharge Plan:     In-House Referral:     Discharge planning Services     Post Acute Care Choice:    Choice offered to:     DME Arranged:    DME Agency:     HH Arranged:    HH Agency:     Status of Service:  In process, will continue to follow  Medicare Important Message Given:    Date Medicare IM Given:    Medicare IM give by:    Date Additional Medicare IM Given:    Additional Medicare Important Message give by:     If discussed at Long Length of Stay Meetings, dates discussed:    Additional Comments:  Kermit BaloKelli F [name], RN 07/20/2015, 10:40 AM

## 2015-07-20 NOTE — Progress Notes (Signed)
OT Cancellation Note  Patient Details Name: Alexander Gonzalez MRN: [id number] DOB: 04/11/1950   Cancelled Treatment:    Reason Eval/Treat Not Completed: Medical issues which prohibited therapy. Awaiting arteriogram w/ possible intervention. Per PT; RN reports pt nauseous and dizzy w/ any mobility.Will follow up for OT eval as time allows and pt is medically appropriate.  Gaye AlkenBailey A Katharine Rochefort M.S., OTR/L Pager: [phone] 07/20/2015, 10:05 AM

## 2015-07-20 NOTE — Progress Notes (Signed)
Patient refused to go down to [facility] for pulmonary perf and vent, Dr. Susie CassetteAbrol stated that if the D-dimer is negative then the test can be cancelled.

## 2015-07-20 NOTE — Care Management Obs Status (Signed)
MEDICARE OBSERVATION STATUS NOTIFICATION   Patient Details  Name: Alexander Gonzalez MRN: [id number] Date of Birth: 06/06/1950   Medicare Observation Status Notification Given:  Yes    Kermit BaloKelli F Baudelio Karnes, RN 07/20/2015, 3:12 PM

## 2015-07-20 NOTE — Progress Notes (Signed)
[facility] PROGRESS NOTE  Alexander Gonzalez VFI:[id number] DOB: 1951/03/19 DOA: 07/19/2015   PCP: Katy Apo, MD     Assessment/Plan: Principal Problem:   Severe headache Active Problems:   HTN (hypertension)   Hyperlipidemia   Abnormal computed tomography angiography of brain   CKD (chronic kidney disease), stage III   Acute hyperglycemia   Basilar artery stenosis   Hypertensive emergency    Brief summary 65 y.o. male with a Past Medical History of HTN, OA, TIA, HLD who presents with HA and R sided weakness and abnormal basilar artery circulation. IR to perform arteriogram and possible intervention. Neuro and IR following  Assessment and plan  Severe headache/ Abnormal computed tomography angiography of brain -Patient presented with severe headache transient right sided weakness (primarily hand) and elevated blood pressure and reported compliance with medications-imaging reveals basilar circulatory abnormalities-CT head without acute stroke and NIHSS was 0 Neurology consultation obtained, interventional radiology also consulted -IR has evaluated patient and plans are to   pursue cerebral arteriogram with possibility of pursuing intracranial vascular intervention if indicated (concern over possible aneurysm), 4/21 Headache management with Toradol, morphine, ordered Depacon 500 mg one dose, Solu-Medrol to 50 mg, baclofen 10 mg, Ativan 0.5 mg     HTN (hypertension) -Currently poorly controlled -Continue preadmission Norvasc and Cozaar -Hydralazine prn -Obtain baseline echocardiogram   CKD (chronic kidney disease), stage III -Likely related to hypertension -Obtain urinalysis to check for proteinuria   Hyperlipidemia -Continue preadmission Zocor   Acute hyperglycemia Hemoglobin A1c pending  Chest pain , noncardiac  Like esophageal spasma, EKG unchanged  D-dimer and troponin's will be checked  If d-dimer positive , do VQ    DVT prophylaxsis  Lovenox  Code Status:  Full code    Code Status Orders       Family Communication: Discussed in detail with the patient, all imaging results, lab results explained to the patient   Disposition Plan:  Anticipate discharge in 2-3 days      Consultants:  Neurology  Interventional radiology  *  Procedures:  None  Antibiotics: Anti-infectives    None         HPI/Subjective: Still complaining of severe headache and nausea this morning, chest pain associated with cough   Objective: Filed Vitals:   07/20/15 0000 07/20/15 0200 07/20/15 0401 07/20/15 0620  BP: 171/89 171/91 166/86 155/83  Pulse: 82 87 84 79  Temp: 98.1 F (36.7 C) 98.1 F (36.7 C) 98 F (36.7 C) 98.6 F (37 C)  TempSrc: Oral Oral Oral Oral  Resp: Weight:      SpO2: 97% 97% 98% 99%   No intake or output data in the 24 hours ending 07/20/15 0904  Exam:  Examination:  General exam: Appears calm and comfortable  Respiratory system: Clear to auscultation. Respiratory effort normal. Cardiovascular system: S1 & S2 heard, RRR. No JVD, murmurs, rubs, gallops or clicks. No pedal edema. Gastrointestinal system: Abdomen is nondistended, soft and nontender. No organomegaly or masses felt. Normal bowel sounds heard. Central nervous system: Alert and oriented. No focal neurological deficits. Extremities: Symmetric 5 x 5 power. Skin: No rashes, lesions or ulcers Psychiatry: Judgement and insight appear normal. Mood & affect appropriate.     Data Reviewed: I have personally reviewed following labs and imaging studies  Micro Results No results found for this or any previous visit (from the past 240 hour(s)).  Radiology Reports Ct Angio Head W/cm &/or Wo Cm  07/19/2015  CLINICAL DATA:  65 year old male code stroke. Severe headache and weakness. Right side weakness. Initial encounter. EXAM: CT ANGIOGRAPHY HEAD AND NECK TECHNIQUE: Multidetector CT imaging of the head and neck was performed  using the standard protocol during bolus administration of intravenous contrast. Multiplanar CT image reconstructions and MIPs were obtained to evaluate the vascular anatomy. Carotid stenosis measurements (when applicable) are obtained utilizing NASCET criteria, using the distal internal carotid diameter as the denominator. CONTRAST:  80 mL Isovue 370. COMPARISON:  Head CT without contrast 1123 hours today. Brain MRI 01/14/2015. FINDINGS: CTA NECK Skeleton: Intermittent poor dentition. Degenerative changes in the cervical and visualized thoracic spine. No acute osseous abnormality identified. Mild to moderate paranasal sinus mucosal thickening with chronic periosteal thickening suggesting chronic sinus disease. Other neck: Mediastinal lipomatosis. No superior mediastinal lymphadenopathy. Negative visualized lung apices aside from atelectasis. Negative thyroid, larynx, pharynx, parapharyngeal spaces, retropharyngeal space, sublingual space, submandibular glands and parotid glands. No cervical lymphadenopathy. Aortic arch: Bovine arch configuration. Minimal to mild calcified arch atherosclerosis. No great vessel origin stenosis. Right carotid system: Mildly tortuous right CCA. Bulky soft and to a lesser extent calcified plaque at the right carotid bifurcation resulting in right ICA origin stenosis of 65 % with respect to the distal vessel. Otherwise negative cervical right ICA. Left carotid system: Tortuous proximal left CCA. Bovine type left CCA origin. At the left carotid bifurcation there is circumferential soft plaque at the left ICA origin, but less than 50 % stenosis with respect to the distal vessel results (series 9, image 119). Otherwise negative cervical left ICA. Vertebral arteries: No proximal right subclavian artery stenosis. Soft plaque with mild stenosis at the right vertebral artery origin (series 9, image 141). Dominant right vertebral artery otherwise is widely patent to the skullbase. No proximal  left subclavian artery stenosis. Non dominant left vertebral artery with a normal origin. Tortuous left V1 segment. No left vertebral artery stenosis to the skullbase. CTA HEAD Posterior circulation: Dominant right vertebral artery with V4 segment calcified plaque resulting an mild to moderate stenosis. Right PICA origin is patent. Non dominant left vertebral artery terminates in the left PICA. Calcified plaque at the basilar artery origin not resulting in stenosis, but there is widespread basilar artery irregularity related to soft atherosclerotic plaque such that a beaded appearance occurs throughout the basilar artery (series 12 images 27 and 28). No high-grade stenosis occurs. The basilar tip remains patent. SCA and MCA origins are patent, but there is moderate to severe stenosis at the left PCA origin. There is additional moderate tandem left P2 segment stenosis. Bilateral distal PCA branches remain patent. Anterior circulation: Soft plaque in the left ICA at the skullbase resulting in up to 50 % stenosis with respect to the distal vessel. Left ICA siphon is patent with mild cavernous and moderate supraclinoid segment calcified plaque not resulting in stenosis. Patent left ICA terminus. Normal left ophthalmic artery origin. Mild cavernous and moderate supra clinoid right ICA calcified plaque without stenosis. Normal right ophthalmic artery origin. Patent right ICA terminus. Right MCA and ACA origins are within normal limits. Fenestrated anterior communicating artery is present. Bilateral A2 and distal ACA branches are within normal limits. Right MCA M1 segment, bifurcation, and right MCA branches are within normal limits. Left ACA origin is highly stenotic such that there is minimal if any flow in the left A1 segment. Still, the left A2 is adequately supplied by the anterior communicating artery. Left MCA origin, M1 segment, bifurcation, and left MCA branches are  within normal limits. Venous sinuses: Patent.  Anatomic variants: Dominant right vertebral artery. Left vertebral artery terminates in PICA. Bovine type arch configuration. IMPRESSION: 1. Negative for emergent large vessel occlusion. 2. Bulky soft plaque at the right carotid bifurcation resulting in 65% stenosis of the right ICA origin. 3. Severe intracranial basilar artery atherosclerosis, resulting in a beaded appearance of the vessel throughout its course, but no hemodynamically significant basilar artery stenosis. There is superimposed mild to moderate stenosis of the right vertebral artery V4 segment which supplies the basilar. The non dominant left vertebral artery terminates in the left PICA. 4. Severe stenosis at the left PCA origin with preserved distal flow. 5. Severe stenosis at the left ACA origin with adequately reconstituted A2 segment flow via the anterior communicating artery. 6. Other intracranial and extracranial atherosclerosis is not hemodynamically significant. Electronically Signed   By: Odessa FlemingH  Hall M.D.   On: 07/19/2015 12:51   Ct Angio Neck W/cm &/or Wo/cm  07/19/2015  CLINICAL DATA:  65 year old male code stroke. Severe headache and weakness. Right side weakness. Initial encounter. EXAM: CT ANGIOGRAPHY HEAD AND NECK TECHNIQUE: Multidetector CT imaging of the head and neck was performed using the standard protocol during bolus administration of intravenous contrast. Multiplanar CT image reconstructions and MIPs were obtained to evaluate the vascular anatomy. Carotid stenosis measurements (when applicable) are obtained utilizing NASCET criteria, using the distal internal carotid diameter as the denominator. CONTRAST:  80 mL Isovue 370. COMPARISON:  Head CT without contrast 1123 hours today. Brain MRI 01/14/2015. FINDINGS: CTA NECK Skeleton: Intermittent poor dentition. Degenerative changes in the cervical and visualized thoracic spine. No acute osseous abnormality identified. Mild to moderate paranasal sinus mucosal thickening with chronic  periosteal thickening suggesting chronic sinus disease. Other neck: Mediastinal lipomatosis. No superior mediastinal lymphadenopathy. Negative visualized lung apices aside from atelectasis. Negative thyroid, larynx, pharynx, parapharyngeal spaces, retropharyngeal space, sublingual space, submandibular glands and parotid glands. No cervical lymphadenopathy. Aortic arch: Bovine arch configuration. Minimal to mild calcified arch atherosclerosis. No great vessel origin stenosis. Right carotid system: Mildly tortuous right CCA. Bulky soft and to a lesser extent calcified plaque at the right carotid bifurcation resulting in right ICA origin stenosis of 65 % with respect to the distal vessel. Otherwise negative cervical right ICA. Left carotid system: Tortuous proximal left CCA. Bovine type left CCA origin. At the left carotid bifurcation there is circumferential soft plaque at the left ICA origin, but less than 50 % stenosis with respect to the distal vessel results (series 9, image 119). Otherwise negative cervical left ICA. Vertebral arteries: No proximal right subclavian artery stenosis. Soft plaque with mild stenosis at the right vertebral artery origin (series 9, image 141). Dominant right vertebral artery otherwise is widely patent to the skullbase. No proximal left subclavian artery stenosis. Non dominant left vertebral artery with a normal origin. Tortuous left V1 segment. No left vertebral artery stenosis to the skullbase. CTA HEAD Posterior circulation: Dominant right vertebral artery with V4 segment calcified plaque resulting an mild to moderate stenosis. Right PICA origin is patent. Non dominant left vertebral artery terminates in the left PICA. Calcified plaque at the basilar artery origin not resulting in stenosis, but there is widespread basilar artery irregularity related to soft atherosclerotic plaque such that a beaded appearance occurs throughout the basilar artery (series 12 images 27 and 28). No  high-grade stenosis occurs. The basilar tip remains patent. SCA and MCA origins are patent, but there is moderate to severe stenosis at the left PCA origin. There  is additional moderate tandem left P2 segment stenosis. Bilateral distal PCA branches remain patent. Anterior circulation: Soft plaque in the left ICA at the skullbase resulting in up to 50 % stenosis with respect to the distal vessel. Left ICA siphon is patent with mild cavernous and moderate supraclinoid segment calcified plaque not resulting in stenosis. Patent left ICA terminus. Normal left ophthalmic artery origin. Mild cavernous and moderate supra clinoid right ICA calcified plaque without stenosis. Normal right ophthalmic artery origin. Patent right ICA terminus. Right MCA and ACA origins are within normal limits. Fenestrated anterior communicating artery is present. Bilateral A2 and distal ACA branches are within normal limits. Right MCA M1 segment, bifurcation, and right MCA branches are within normal limits. Left ACA origin is highly stenotic such that there is minimal if any flow in the left A1 segment. Still, the left A2 is adequately supplied by the anterior communicating artery. Left MCA origin, M1 segment, bifurcation, and left MCA branches are within normal limits. Venous sinuses: Patent. Anatomic variants: Dominant right vertebral artery. Left vertebral artery terminates in PICA. Bovine type arch configuration. IMPRESSION: 1. Negative for emergent large vessel occlusion. 2. Bulky soft plaque at the right carotid bifurcation resulting in 65% stenosis of the right ICA origin. 3. Severe intracranial basilar artery atherosclerosis, resulting in a beaded appearance of the vessel throughout its course, but no hemodynamically significant basilar artery stenosis. There is superimposed mild to moderate stenosis of the right vertebral artery V4 segment which supplies the basilar. The non dominant left vertebral artery terminates in the left PICA. 4.  Severe stenosis at the left PCA origin with preserved distal flow. 5. Severe stenosis at the left ACA origin with adequately reconstituted A2 segment flow via the anterior communicating artery. 6. Other intracranial and extracranial atherosclerosis is not hemodynamically significant. Electronically Signed   By: Odessa Fleming M.D.   On: 07/19/2015 12:51   Ct Head Code Stroke W/o Cm  07/19/2015  CLINICAL DATA:  Severe headache and right-sided weakness today. EXAM: CT HEAD WITHOUT CONTRAST TECHNIQUE: Contiguous axial images were obtained from the base of the skull through the vertex without intravenous contrast. COMPARISON:  Head CT 01/14/2015 and MRI brain 01/14/2015 FINDINGS: Stable age related cerebral atrophy, ventriculomegaly and periventricular white matter disease. No findings for acute hemispheric infarction an or intracranial hemorrhage. No extra-axial fluid collections are identified. No mass lesions. The brainstem and cerebellum are grossly normal in stable. Vascular calcifications are noted. No acute bony findings. Extensive mucoperiosteal thickening involving the maxillary and ethmoid sinuses bilaterally. The mastoid air cells and middle ear cavities are clear. The globes are intact. IMPRESSION: No acute intracranial findings. Specifically, no findings for hemispheric infarction or intracranial hemorrhage. Stable age related cerebral atrophy and mild ventriculomegaly. Electronically Signed   By: Rudie Meyer M.D.   On: 07/19/2015 11:29     CBC  Recent Labs Lab 07/19/15 1114 07/19/15 1122 07/20/15 0605  WBC 5.9  --  11.5*  HGB 13.7 16.0 14.2  HCT 42.2 47.0 43.4  PLT 177  --  218  MCV 85.9  --  85.4  MCH 27.9  --  28.0  MCHC 32.5  --  32.7  RDW 13.6  --  13.5  LYMPHSABS 2.1  --   --   MONOABS 0.4  --   --   EOSABS 0.3  --   --   BASOSABS 0.0  --   --     Chemistries   Recent Labs Lab 07/19/15 1114 07/19/15 1122 07/20/15  0605  NA 140 140 136  K 4.5 4.4 4.4  CL 105 104 101  CO2  22  --  23  GLUCOSE 157* 153* 147*  BUN CREATININE 1.44* 1.40* 1.27*  CALCIUM 9.2  --  9.2  AST 26  --   --   ALT 21  --   --   ALKPHOS 59  --   --   BILITOT 0.9  --   --    ------------------------------------------------------------------------------------------------------------------ CrCl cannot be calculated (Unknown ideal weight.). ------------------------------------------------------------------------------------------------------------------ No results for input(s): HGBA1C in the last 72 hours. ------------------------------------------------------------------------------------------------------------------  Recent Labs  07/20/15 0605  CHOL 200  HDL 55  LDLCALC 122*  TRIG 116  CHOLHDL 3.6   ------------------------------------------------------------------------------------------------------------------ No results for input(s): TSH, T4TOTAL, T3FREE, THYROIDAB in the last 72 hours.  Invalid input(s): FREET3 ------------------------------------------------------------------------------------------------------------------ No results for input(s): VITAMINB12, FOLATE, FERRITIN, TIBC, IRON, RETICCTPCT in the last 72 hours.  Coagulation profile  Recent Labs Lab 07/19/15 1114  INR 1.10    No results for input(s): DDIMER in the last 72 hours.  Cardiac Enzymes No results for input(s): CKMB, TROPONINI, MYOGLOBIN in the last 168 hours.  Invalid input(s): CK ------------------------------------------------------------------------------------------------------------------ Invalid input(s): POCBNP   CBG:  Recent Labs Lab 07/19/15 1137  GLUCAP 131*       Studies: Ct Angio Head W/cm &/or Wo Cm  07/19/2015  CLINICAL DATA:  65 year old male code stroke. Severe headache and weakness. Right side weakness. Initial encounter. EXAM: CT ANGIOGRAPHY HEAD AND NECK TECHNIQUE: Multidetector CT imaging of the head and neck was performed using the standard protocol  during bolus administration of intravenous contrast. Multiplanar CT image reconstructions and MIPs were obtained to evaluate the vascular anatomy. Carotid stenosis measurements (when applicable) are obtained utilizing NASCET criteria, using the distal internal carotid diameter as the denominator. CONTRAST:  80 mL Isovue 370. COMPARISON:  Head CT without contrast 1123 hours today. Brain MRI 01/14/2015. FINDINGS: CTA NECK Skeleton: Intermittent poor dentition. Degenerative changes in the cervical and visualized thoracic spine. No acute osseous abnormality identified. Mild to moderate paranasal sinus mucosal thickening with chronic periosteal thickening suggesting chronic sinus disease. Other neck: Mediastinal lipomatosis. No superior mediastinal lymphadenopathy. Negative visualized lung apices aside from atelectasis. Negative thyroid, larynx, pharynx, parapharyngeal spaces, retropharyngeal space, sublingual space, submandibular glands and parotid glands. No cervical lymphadenopathy. Aortic arch: Bovine arch configuration. Minimal to mild calcified arch atherosclerosis. No great vessel origin stenosis. Right carotid system: Mildly tortuous right CCA. Bulky soft and to a lesser extent calcified plaque at the right carotid bifurcation resulting in right ICA origin stenosis of 65 % with respect to the distal vessel. Otherwise negative cervical right ICA. Left carotid system: Tortuous proximal left CCA. Bovine type left CCA origin. At the left carotid bifurcation there is circumferential soft plaque at the left ICA origin, but less than 50 % stenosis with respect to the distal vessel results (series 9, image 119). Otherwise negative cervical left ICA. Vertebral arteries: No proximal right subclavian artery stenosis. Soft plaque with mild stenosis at the right vertebral artery origin (series 9, image 141). Dominant right vertebral artery otherwise is widely patent to the skullbase. No proximal left subclavian artery  stenosis. Non dominant left vertebral artery with a normal origin. Tortuous left V1 segment. No left vertebral artery stenosis to the skullbase. CTA HEAD Posterior circulation: Dominant right vertebral artery with V4 segment calcified plaque resulting an mild to moderate stenosis. Right PICA origin is patent. Non dominant left vertebral artery terminates in  the left PICA. Calcified plaque at the basilar artery origin not resulting in stenosis, but there is widespread basilar artery irregularity related to soft atherosclerotic plaque such that a beaded appearance occurs throughout the basilar artery (series 12 images 27 and 28). No high-grade stenosis occurs. The basilar tip remains patent. SCA and MCA origins are patent, but there is moderate to severe stenosis at the left PCA origin. There is additional moderate tandem left P2 segment stenosis. Bilateral distal PCA branches remain patent. Anterior circulation: Soft plaque in the left ICA at the skullbase resulting in up to 50 % stenosis with respect to the distal vessel. Left ICA siphon is patent with mild cavernous and moderate supraclinoid segment calcified plaque not resulting in stenosis. Patent left ICA terminus. Normal left ophthalmic artery origin. Mild cavernous and moderate supra clinoid right ICA calcified plaque without stenosis. Normal right ophthalmic artery origin. Patent right ICA terminus. Right MCA and ACA origins are within normal limits. Fenestrated anterior communicating artery is present. Bilateral A2 and distal ACA branches are within normal limits. Right MCA M1 segment, bifurcation, and right MCA branches are within normal limits. Left ACA origin is highly stenotic such that there is minimal if any flow in the left A1 segment. Still, the left A2 is adequately supplied by the anterior communicating artery. Left MCA origin, M1 segment, bifurcation, and left MCA branches are within normal limits. Venous sinuses: Patent. Anatomic variants:  Dominant right vertebral artery. Left vertebral artery terminates in PICA. Bovine type arch configuration. IMPRESSION: 1. Negative for emergent large vessel occlusion. 2. Bulky soft plaque at the right carotid bifurcation resulting in 65% stenosis of the right ICA origin. 3. Severe intracranial basilar artery atherosclerosis, resulting in a beaded appearance of the vessel throughout its course, but no hemodynamically significant basilar artery stenosis. There is superimposed mild to moderate stenosis of the right vertebral artery V4 segment which supplies the basilar. The non dominant left vertebral artery terminates in the left PICA. 4. Severe stenosis at the left PCA origin with preserved distal flow. 5. Severe stenosis at the left ACA origin with adequately reconstituted A2 segment flow via the anterior communicating artery. 6. Other intracranial and extracranial atherosclerosis is not hemodynamically significant. Electronically Signed   By: Odessa Fleming M.D.   On: 07/19/2015 12:51   Ct Angio Neck W/cm &/or Wo/cm  07/19/2015  CLINICAL DATA:  65 year old male code stroke. Severe headache and weakness. Right side weakness. Initial encounter. EXAM: CT ANGIOGRAPHY HEAD AND NECK TECHNIQUE: Multidetector CT imaging of the head and neck was performed using the standard protocol during bolus administration of intravenous contrast. Multiplanar CT image reconstructions and MIPs were obtained to evaluate the vascular anatomy. Carotid stenosis measurements (when applicable) are obtained utilizing NASCET criteria, using the distal internal carotid diameter as the denominator. CONTRAST:  80 mL Isovue 370. COMPARISON:  Head CT without contrast 1123 hours today. Brain MRI 01/14/2015. FINDINGS: CTA NECK Skeleton: Intermittent poor dentition. Degenerative changes in the cervical and visualized thoracic spine. No acute osseous abnormality identified. Mild to moderate paranasal sinus mucosal thickening with chronic periosteal  thickening suggesting chronic sinus disease. Other neck: Mediastinal lipomatosis. No superior mediastinal lymphadenopathy. Negative visualized lung apices aside from atelectasis. Negative thyroid, larynx, pharynx, parapharyngeal spaces, retropharyngeal space, sublingual space, submandibular glands and parotid glands. No cervical lymphadenopathy. Aortic arch: Bovine arch configuration. Minimal to mild calcified arch atherosclerosis. No great vessel origin stenosis. Right carotid system: Mildly tortuous right CCA. Bulky soft and to a lesser extent calcified plaque at  the right carotid bifurcation resulting in right ICA origin stenosis of 65 % with respect to the distal vessel. Otherwise negative cervical right ICA. Left carotid system: Tortuous proximal left CCA. Bovine type left CCA origin. At the left carotid bifurcation there is circumferential soft plaque at the left ICA origin, but less than 50 % stenosis with respect to the distal vessel results (series 9, image 119). Otherwise negative cervical left ICA. Vertebral arteries: No proximal right subclavian artery stenosis. Soft plaque with mild stenosis at the right vertebral artery origin (series 9, image 141). Dominant right vertebral artery otherwise is widely patent to the skullbase. No proximal left subclavian artery stenosis. Non dominant left vertebral artery with a normal origin. Tortuous left V1 segment. No left vertebral artery stenosis to the skullbase. CTA HEAD Posterior circulation: Dominant right vertebral artery with V4 segment calcified plaque resulting an mild to moderate stenosis. Right PICA origin is patent. Non dominant left vertebral artery terminates in the left PICA. Calcified plaque at the basilar artery origin not resulting in stenosis, but there is widespread basilar artery irregularity related to soft atherosclerotic plaque such that a beaded appearance occurs throughout the basilar artery (series 12 images 27 and 28). No high-grade  stenosis occurs. The basilar tip remains patent. SCA and MCA origins are patent, but there is moderate to severe stenosis at the left PCA origin. There is additional moderate tandem left P2 segment stenosis. Bilateral distal PCA branches remain patent. Anterior circulation: Soft plaque in the left ICA at the skullbase resulting in up to 50 % stenosis with respect to the distal vessel. Left ICA siphon is patent with mild cavernous and moderate supraclinoid segment calcified plaque not resulting in stenosis. Patent left ICA terminus. Normal left ophthalmic artery origin. Mild cavernous and moderate supra clinoid right ICA calcified plaque without stenosis. Normal right ophthalmic artery origin. Patent right ICA terminus. Right MCA and ACA origins are within normal limits. Fenestrated anterior communicating artery is present. Bilateral A2 and distal ACA branches are within normal limits. Right MCA M1 segment, bifurcation, and right MCA branches are within normal limits. Left ACA origin is highly stenotic such that there is minimal if any flow in the left A1 segment. Still, the left A2 is adequately supplied by the anterior communicating artery. Left MCA origin, M1 segment, bifurcation, and left MCA branches are within normal limits. Venous sinuses: Patent. Anatomic variants: Dominant right vertebral artery. Left vertebral artery terminates in PICA. Bovine type arch configuration. IMPRESSION: 1. Negative for emergent large vessel occlusion. 2. Bulky soft plaque at the right carotid bifurcation resulting in 65% stenosis of the right ICA origin. 3. Severe intracranial basilar artery atherosclerosis, resulting in a beaded appearance of the vessel throughout its course, but no hemodynamically significant basilar artery stenosis. There is superimposed mild to moderate stenosis of the right vertebral artery V4 segment which supplies the basilar. The non dominant left vertebral artery terminates in the left PICA. 4. Severe  stenosis at the left PCA origin with preserved distal flow. 5. Severe stenosis at the left ACA origin with adequately reconstituted A2 segment flow via the anterior communicating artery. 6. Other intracranial and extracranial atherosclerosis is not hemodynamically significant. Electronically Signed   By: Odessa Fleming M.D.   On: 07/19/2015 12:51   Ct Head Code Stroke W/o Cm  07/19/2015  CLINICAL DATA:  Severe headache and right-sided weakness today. EXAM: CT HEAD WITHOUT CONTRAST TECHNIQUE: Contiguous axial images were obtained from the base of the skull through the vertex without  intravenous contrast. COMPARISON:  Head CT 01/14/2015 and MRI brain 01/14/2015 FINDINGS: Stable age related cerebral atrophy, ventriculomegaly and periventricular white matter disease. No findings for acute hemispheric infarction an or intracranial hemorrhage. No extra-axial fluid collections are identified. No mass lesions. The brainstem and cerebellum are grossly normal in stable. Vascular calcifications are noted. No acute bony findings. Extensive mucoperiosteal thickening involving the maxillary and ethmoid sinuses bilaterally. The mastoid air cells and middle ear cavities are clear. The globes are intact. IMPRESSION: No acute intracranial findings. Specifically, no findings for hemispheric infarction or intracranial hemorrhage. Stable age related cerebral atrophy and mild ventriculomegaly. Electronically Signed   By: Rudie Meyer M.D.   On: 07/19/2015 11:29      Lab Results  Component Value Date   HGBA1C 5.9* 01/15/2015   Lab Results  Component Value Date   LDLCALC 122* 07/20/2015   CREATININE 1.27* 07/20/2015       Scheduled Meds: . amLODipine  10 mg Oral Daily  . aspirin EC  81 mg Oral Daily  . enoxaparin (LOVENOX) injection  40 mg Subcutaneous Q24H  . losartan  100 mg Oral Daily  . methylPREDNISolone (SOLU-MEDROL) injection  80 mg Intravenous Once  . pantoprazole (PROTONIX) IV  40 mg Intravenous Q24H  .  simvastatin  20 mg Oral QPM   Continuous Infusions:        Time spent: >30 MINS    [facility]  [facility] Pager [phone]. If 7PM-7AM, please contact night-coverage at [email or web address], password [facility] 07/20/2015, 9:04 AM

## 2015-07-20 NOTE — Progress Notes (Signed)
SLP Cancellation Note  Patient Details Name: Alexander Gonzalez MRN: [id number] DOB: 02/26/1951   Cancelled treatment:       Reason Eval/Treat Not Completed: Medical issues which prohibited therapy. Pt dealing with n/v and HA while awaiting arteriogram. Will follow.   Rocky CraftsKara E Zali Kamaka MA, CCC-SLP 07/20/2015, 10:06 AM

## 2015-07-20 NOTE — Progress Notes (Signed)
Interval History:                                                                                                                      Alexander Gonzalez is an [age] y.o. male patient with  severe headaches, basilar artery stenosis. He was unable to cooperate for angiogram procedure because of headache. Hence it was not done today,    Past Medical History: Past Medical History  Diagnosis Date  . Hypertension   . Osteoarthritis   . Mini stroke (HCC) 1996  . Hyperlipidemia     Past Surgical History  Procedure Laterality Date  . Knee surgery  1976    right  . Replacement total knee  1996    left  . Knee surgery  1980'2    right    Family History: Family History  Problem Relation Age of Onset  . Cancer Mother   . Hypertension Mother   . Heart attack Father     Social History:   reports that he has never smoked. He does not have any smokeless tobacco history on file. He reports that he drinks alcohol. He reports that he does not use illicit drugs.  Allergies:  No Known Allergies   Medications:                                                                                                                         Current facility-administered medications:  .  acetaminophen (TYLENOL) tablet 1,000 mg, 1,000 mg, Oral, Q6H PRN, Ozella Rocksavid J Merrell, MD, 1,000 mg at 07/20/15 0301 .  alum & mag hydroxide-simeth (MAALOX/MYLANTA) 200-200-20 MG/5ML suspension 15 mL, 15 mL, Oral, Q6H PRN, Richarda OverlieNayana Abrol, MD .  amLODipine (NORVASC) tablet 10 mg, 10 mg, Oral, Daily, Russella DarAllison L Ellis, NP, 10 mg at 07/19/15 1643 .  aspirin EC tablet 81 mg, 81 mg, Oral, Daily, Russella DarAllison L Ellis, NP, 81 mg at 07/20/15 1219 .  baclofen (LIORESAL) tablet 10 mg, 10 mg, Oral, TID, Kataleah Bejar Daniel NonesNarayan Kaveer Avaya Mcjunkins, MD, 10 mg at 07/20/15 1950 .  clonazePAM (KLONOPIN) tablet 1 mg, 1 mg, Oral, QHS, Iyari Hagner Daniel NonesNarayan Kaveer Awesome Jared, MD .  enoxaparin (LOVENOX) injection 40 mg, 40 mg, Subcutaneous, Q24H, Russella DarAllison L Ellis, NP, 40 mg at 07/20/15  1951 .  fentaNYL (SUBLIMAZE) 100 MCG/2ML injection, , , ,  .  fentaNYL (SUBLIMAZE) injection, , Intravenous, PRN, Julieanne CottonSanjeev Deveshwar, MD, 25 mcg at 07/20/15 1113 .  hydrALAZINE (APRESOLINE) 20 MG/ML injection, , , ,  .  hydrALAZINE (APRESOLINE) injection 10 mg, 10 mg, Intravenous, Q4H PRN, Russella Dar, NP .  hydrALAZINE (APRESOLINE) injection, , , PRN, Julieanne Cotton, MD, 5 mg at 07/20/15 1112 .  HYDROmorphone (DILAUDID) injection 1 mg, 1 mg, Intravenous, Q4H PRN, Richarda Overlie, MD, 1 mg at 07/20/15 1952 .  iopamidol (ISOVUE-300) 61 % injection, , , ,  .  losartan (COZAAR) tablet 100 mg, 100 mg, Oral, Daily, Russella Dar, NP, 100 mg at 07/19/15 1643 .  methylPREDNISolone sodium succinate (SOLU-MEDROL) 125 mg/2 mL injection 125 mg, 125 mg, Intravenous, Q6H, Vondell Sowell Daniel Nones, MD, 125 mg at 07/20/15 1951 .  ondansetron (ZOFRAN) injection 4 mg, 4 mg, Intravenous, Q6H PRN, Richarda Overlie, MD, 4 mg at 07/20/15 1950 .  oxyCODONE (Oxy IR/ROXICODONE) immediate release tablet 5 mg, 5 mg, Oral, Q6H PRN, Ozella Rocks, MD, 5 mg at 07/20/15 0257 .  pantoprazole (PROTONIX) injection 40 mg, 40 mg, Intravenous, Q24H, Richarda Overlie, MD, 40 mg at 07/20/15 1220 .  simvastatin (ZOCOR) tablet 20 mg, 20 mg, Oral, QPM, Russella Dar, NP, 20 mg at 07/20/15 1951 .  valproate (DEPACON) 250 mg in dextrose 5 % 50 mL IVPB, 250 mg, Intravenous, Q6H, Samanth Mirkin Daniel Nones, MD, 250 mg at 07/20/15 1951   Neurologic Examination:                                                                                                     Today's Vitals   07/20/15 1251 07/20/15 1457 07/20/15 1823 07/20/15 1955  BP:  169/92 160/80   Pulse:  77 71   Temp:  98 F (36.7 C) 98.2 F (36.8 C)   TempSrc:  Oral Oral   Resp:  18 18   Weight:      SpO2:  100% 100%   PainSc: 0-No pain   8     Evaluation of higher integrative functions including: Level of alertness: Alert,  Oriented to time, place and  person Speech: fluent, no evidence of dysarthria or aphasia noted.  Test the following cranial nerves: 2-12 grossly intact Motor examination: Normal tone, bulk, full 5/5 motor strength in all 4 extremities Examination of sensation : Normal and symmetric sensation to pinprick in all 4 extremities and on face Examination of deep tendon reflexes: 2+, normal and symmetric in all extremities, normal plantars bilaterally Test coordination: Normal finger nose testing, with no evidence of limb appendicular ataxia or abnormal involuntary movements or tremors noted.  Gait: Deferred   Lab Results: Basic Metabolic Panel:  Recent Labs Lab 07/19/15 1114 07/19/15 1122 07/20/15 0605  NA 140 140 136  K 4.5 4.4 4.4  CL 105 104 101  CO2 22  --  23  GLUCOSE 157* 153* 147*  BUN 14 18 20   CREATININE 1.44* 1.40* 1.27*  CALCIUM 9.2  --  9.2    Liver Function Tests:  Recent Labs Lab 07/19/15 1114  AST 26  ALT 21  ALKPHOS 59  BILITOT 0.9  PROT 8.1  ALBUMIN 4.1   No results for input(s): LIPASE, AMYLASE in the last 168 hours.  No results for input(s): AMMONIA in the last 168 hours.  CBC:  Recent Labs Lab 07/19/15 1114 07/19/15 1122 07/20/15 0605  WBC 5.9  --  11.5*  NEUTROABS 3.2  --   --   HGB 13.7 16.0 14.2  HCT 42.2 47.0 43.4  MCV 85.9  --  85.4  PLT 177  --  218    Cardiac Enzymes:  Recent Labs Lab 07/20/15 1211  TROPONINI <0.03    Lipid Panel:  Recent Labs Lab 07/20/15 0605  CHOL 200  TRIG 116  HDL 55  CHOLHDL 3.6  VLDL 23  LDLCALC 161*    CBG:  Recent Labs Lab 07/19/15 1137  GLUCAP 131*    Microbiology: No results found for this or any previous visit.  Imaging: Ct Angio Head W/cm &/or Wo Cm  07/19/2015  CLINICAL DATA:  65 year old male code stroke. Severe headache and weakness. Right side weakness. Initial encounter. EXAM: CT ANGIOGRAPHY HEAD AND NECK TECHNIQUE: Multidetector CT imaging of the head and neck was performed using the standard  protocol during bolus administration of intravenous contrast. Multiplanar CT image reconstructions and MIPs were obtained to evaluate the vascular anatomy. Carotid stenosis measurements (when applicable) are obtained utilizing NASCET criteria, using the distal internal carotid diameter as the denominator. CONTRAST:  80 mL Isovue 370. COMPARISON:  Head CT without contrast 1123 hours today. Brain MRI 01/14/2015. FINDINGS: CTA NECK Skeleton: Intermittent poor dentition. Degenerative changes in the cervical and visualized thoracic spine. No acute osseous abnormality identified. Mild to moderate paranasal sinus mucosal thickening with chronic periosteal thickening suggesting chronic sinus disease. Other neck: Mediastinal lipomatosis. No superior mediastinal lymphadenopathy. Negative visualized lung apices aside from atelectasis. Negative thyroid, larynx, pharynx, parapharyngeal spaces, retropharyngeal space, sublingual space, submandibular glands and parotid glands. No cervical lymphadenopathy. Aortic arch: Bovine arch configuration. Minimal to mild calcified arch atherosclerosis. No great vessel origin stenosis. Right carotid system: Mildly tortuous right CCA. Bulky soft and to a lesser extent calcified plaque at the right carotid bifurcation resulting in right ICA origin stenosis of 65 % with respect to the distal vessel. Otherwise negative cervical right ICA. Left carotid system: Tortuous proximal left CCA. Bovine type left CCA origin. At the left carotid bifurcation there is circumferential soft plaque at the left ICA origin, but less than 50 % stenosis with respect to the distal vessel results (series 9, image 119). Otherwise negative cervical left ICA. Vertebral arteries: No proximal right subclavian artery stenosis. Soft plaque with mild stenosis at the right vertebral artery origin (series 9, image 141). Dominant right vertebral artery otherwise is widely patent to the skullbase. No proximal left subclavian  artery stenosis. Non dominant left vertebral artery with a normal origin. Tortuous left V1 segment. No left vertebral artery stenosis to the skullbase. CTA HEAD Posterior circulation: Dominant right vertebral artery with V4 segment calcified plaque resulting an mild to moderate stenosis. Right PICA origin is patent. Non dominant left vertebral artery terminates in the left PICA. Calcified plaque at the basilar artery origin not resulting in stenosis, but there is widespread basilar artery irregularity related to soft atherosclerotic plaque such that a beaded appearance occurs throughout the basilar artery (series 12 images 27 and 28). No high-grade stenosis occurs. The basilar tip remains patent. SCA and MCA origins are patent, but there is moderate to severe stenosis at the left PCA origin. There is additional moderate tandem left P2 segment stenosis. Bilateral distal PCA branches remain patent. Anterior circulation: Soft plaque in the left ICA at the skullbase  resulting in up to 50 % stenosis with respect to the distal vessel. Left ICA siphon is patent with mild cavernous and moderate supraclinoid segment calcified plaque not resulting in stenosis. Patent left ICA terminus. Normal left ophthalmic artery origin. Mild cavernous and moderate supra clinoid right ICA calcified plaque without stenosis. Normal right ophthalmic artery origin. Patent right ICA terminus. Right MCA and ACA origins are within normal limits. Fenestrated anterior communicating artery is present. Bilateral A2 and distal ACA branches are within normal limits. Right MCA M1 segment, bifurcation, and right MCA branches are within normal limits. Left ACA origin is highly stenotic such that there is minimal if any flow in the left A1 segment. Still, the left A2 is adequately supplied by the anterior communicating artery. Left MCA origin, M1 segment, bifurcation, and left MCA branches are within normal limits. Venous sinuses: Patent. Anatomic variants:  Dominant right vertebral artery. Left vertebral artery terminates in PICA. Bovine type arch configuration. IMPRESSION: 1. Negative for emergent large vessel occlusion. 2. Bulky soft plaque at the right carotid bifurcation resulting in 65% stenosis of the right ICA origin. 3. Severe intracranial basilar artery atherosclerosis, resulting in a beaded appearance of the vessel throughout its course, but no hemodynamically significant basilar artery stenosis. There is superimposed mild to moderate stenosis of the right vertebral artery V4 segment which supplies the basilar. The non dominant left vertebral artery terminates in the left PICA. 4. Severe stenosis at the left PCA origin with preserved distal flow. 5. Severe stenosis at the left ACA origin with adequately reconstituted A2 segment flow via the anterior communicating artery. 6. Other intracranial and extracranial atherosclerosis is not hemodynamically significant. Electronically Signed   By: Odessa Fleming M.D.   On: 07/19/2015 12:51   Dg Chest 2 View  07/20/2015  CLINICAL DATA:  Dry cough and severe headache yesterday, history hypertension EXAM: CHEST  2 VIEW COMPARISON:  None FINDINGS: Lordotic positioning with slight rotation to the RIGHT on AP view. Normal heart size, mediastinal contours and pulmonary vascularity. Elevation of RIGHT diaphragm with RIGHT basilar atelectasis. Lungs otherwise clear. No pleural effusion or pneumothorax. IMPRESSION: RIGHT basilar atelectasis. Electronically Signed   By: Ulyses Southward M.D.   On: 07/20/2015 11:16   Ct Angio Neck W/cm &/or Wo/cm  07/19/2015  CLINICAL DATA:  65 year old male code stroke. Severe headache and weakness. Right side weakness. Initial encounter. EXAM: CT ANGIOGRAPHY HEAD AND NECK TECHNIQUE: Multidetector CT imaging of the head and neck was performed using the standard protocol during bolus administration of intravenous contrast. Multiplanar CT image reconstructions and MIPs were obtained to evaluate the  vascular anatomy. Carotid stenosis measurements (when applicable) are obtained utilizing NASCET criteria, using the distal internal carotid diameter as the denominator. CONTRAST:  80 mL Isovue 370. COMPARISON:  Head CT without contrast 1123 hours today. Brain MRI 01/14/2015. FINDINGS: CTA NECK Skeleton: Intermittent poor dentition. Degenerative changes in the cervical and visualized thoracic spine. No acute osseous abnormality identified. Mild to moderate paranasal sinus mucosal thickening with chronic periosteal thickening suggesting chronic sinus disease. Other neck: Mediastinal lipomatosis. No superior mediastinal lymphadenopathy. Negative visualized lung apices aside from atelectasis. Negative thyroid, larynx, pharynx, parapharyngeal spaces, retropharyngeal space, sublingual space, submandibular glands and parotid glands. No cervical lymphadenopathy. Aortic arch: Bovine arch configuration. Minimal to mild calcified arch atherosclerosis. No great vessel origin stenosis. Right carotid system: Mildly tortuous right CCA. Bulky soft and to a lesser extent calcified plaque at the right carotid bifurcation resulting in right ICA origin stenosis of 65 %  with respect to the distal vessel. Otherwise negative cervical right ICA. Left carotid system: Tortuous proximal left CCA. Bovine type left CCA origin. At the left carotid bifurcation there is circumferential soft plaque at the left ICA origin, but less than 50 % stenosis with respect to the distal vessel results (series 9, image 119). Otherwise negative cervical left ICA. Vertebral arteries: No proximal right subclavian artery stenosis. Soft plaque with mild stenosis at the right vertebral artery origin (series 9, image 141). Dominant right vertebral artery otherwise is widely patent to the skullbase. No proximal left subclavian artery stenosis. Non dominant left vertebral artery with a normal origin. Tortuous left V1 segment. No left vertebral artery stenosis to the  skullbase. CTA HEAD Posterior circulation: Dominant right vertebral artery with V4 segment calcified plaque resulting an mild to moderate stenosis. Right PICA origin is patent. Non dominant left vertebral artery terminates in the left PICA. Calcified plaque at the basilar artery origin not resulting in stenosis, but there is widespread basilar artery irregularity related to soft atherosclerotic plaque such that a beaded appearance occurs throughout the basilar artery (series 12 images 27 and 28). No high-grade stenosis occurs. The basilar tip remains patent. SCA and MCA origins are patent, but there is moderate to severe stenosis at the left PCA origin. There is additional moderate tandem left P2 segment stenosis. Bilateral distal PCA branches remain patent. Anterior circulation: Soft plaque in the left ICA at the skullbase resulting in up to 50 % stenosis with respect to the distal vessel. Left ICA siphon is patent with mild cavernous and moderate supraclinoid segment calcified plaque not resulting in stenosis. Patent left ICA terminus. Normal left ophthalmic artery origin. Mild cavernous and moderate supra clinoid right ICA calcified plaque without stenosis. Normal right ophthalmic artery origin. Patent right ICA terminus. Right MCA and ACA origins are within normal limits. Fenestrated anterior communicating artery is present. Bilateral A2 and distal ACA branches are within normal limits. Right MCA M1 segment, bifurcation, and right MCA branches are within normal limits. Left ACA origin is highly stenotic such that there is minimal if any flow in the left A1 segment. Still, the left A2 is adequately supplied by the anterior communicating artery. Left MCA origin, M1 segment, bifurcation, and left MCA branches are within normal limits. Venous sinuses: Patent. Anatomic variants: Dominant right vertebral artery. Left vertebral artery terminates in PICA. Bovine type arch configuration. IMPRESSION: 1. Negative for  emergent large vessel occlusion. 2. Bulky soft plaque at the right carotid bifurcation resulting in 65% stenosis of the right ICA origin. 3. Severe intracranial basilar artery atherosclerosis, resulting in a beaded appearance of the vessel throughout its course, but no hemodynamically significant basilar artery stenosis. There is superimposed mild to moderate stenosis of the right vertebral artery V4 segment which supplies the basilar. The non dominant left vertebral artery terminates in the left PICA. 4. Severe stenosis at the left PCA origin with preserved distal flow. 5. Severe stenosis at the left ACA origin with adequately reconstituted A2 segment flow via the anterior communicating artery. 6. Other intracranial and extracranial atherosclerosis is not hemodynamically significant. Electronically Signed   By: Odessa Fleming M.D.   On: 07/19/2015 12:51   Ct Head Code Stroke W/o Cm  07/19/2015  CLINICAL DATA:  Severe headache and right-sided weakness today. EXAM: CT HEAD WITHOUT CONTRAST TECHNIQUE: Contiguous axial images were obtained from the base of the skull through the vertex without intravenous contrast. COMPARISON:  Head CT 01/14/2015 and MRI brain 01/14/2015 FINDINGS: Stable  age related cerebral atrophy, ventriculomegaly and periventricular white matter disease. No findings for acute hemispheric infarction an or intracranial hemorrhage. No extra-axial fluid collections are identified. No mass lesions. The brainstem and cerebellum are grossly normal in stable. Vascular calcifications are noted. No acute bony findings. Extensive mucoperiosteal thickening involving the maxillary and ethmoid sinuses bilaterally. The mastoid air cells and middle ear cavities are clear. The globes are intact. IMPRESSION: No acute intracranial findings. Specifically, no findings for hemispheric infarction or intracranial hemorrhage. Stable age related cerebral atrophy and mild ventriculomegaly. Electronically Signed   By: Rudie Meyer M.D.   On: 07/19/2015 11:29    Assessment and plan:   Alexander Gonzalez is an 65 y.o. male patient with severe headache reports bitemporal and frontal location of the headache. Recommend IV Depacon to every 6 hours, IV Solu-Medrol 125 g every 6 hours, baclofen 10 mg 3 times a day to help with the headache. If his headache is better controlled but tomorrow morning,  then will consider diagnostic angiogram tomorrow to further evaluate the basilar artery stenosis.  Ordered MRI of the brain with and without contrast to evaluate for intracranial pathology contributing to this severe headache.  We'll follow-up

## 2015-07-21 ENCOUNTER — Inpatient Hospital Stay (HOSPITAL_COMMUNITY): Payer: Medicare Other

## 2015-07-21 DIAGNOSIS — I10 Essential (primary) hypertension: Secondary | ICD-10-CM

## 2015-07-21 DIAGNOSIS — I651 Occlusion and stenosis of basilar artery: Secondary | ICD-10-CM

## 2015-07-21 LAB — CBC
HCT: 43.5 % (ref 39.0–52.0)
Hemoglobin: 13.9 g/dL (ref 13.0–17.0)
MCH: 28.3 pg (ref 26.0–34.0)
MCHC: 32 g/dL (ref 30.0–36.0)
MCV: 88.4 fL (ref 78.0–100.0)
PLATELETS: 227 10*3/uL (ref 150–400)
RBC: 4.92 MIL/uL (ref 4.22–5.81)
RDW: 13.9 % (ref 11.5–15.5)
WBC: 15.4 10*3/uL — ABNORMAL HIGH (ref 4.0–10.5)

## 2015-07-21 LAB — COMPREHENSIVE METABOLIC PANEL
ALT: 17 U/L (ref 17–63)
ANION GAP: 11 (ref 5–15)
AST: 17 U/L (ref 15–41)
Albumin: 3.4 g/dL — ABNORMAL LOW (ref 3.5–5.0)
Alkaline Phosphatase: 54 U/L (ref 38–126)
BUN: 24 mg/dL — ABNORMAL HIGH (ref 6–20)
CALCIUM: 9.1 mg/dL (ref 8.9–10.3)
CHLORIDE: 103 mmol/L (ref 101–111)
CO2: 24 mmol/L (ref 22–32)
CREATININE: 1.33 mg/dL — AB (ref 0.61–1.24)
GFR, EST NON AFRICAN AMERICAN: 55 mL/min — AB (ref >60)
Glucose, Bld: 133 mg/dL — ABNORMAL HIGH (ref 65–99)
Potassium: 4.8 mmol/L (ref 3.5–5.1)
SODIUM: 138 mmol/L (ref 135–145)
TOTAL PROTEIN: 7.6 g/dL (ref 6.5–8.1)
Total Bilirubin: 0.5 mg/dL (ref 0.3–1.2)

## 2015-07-21 LAB — ECHOCARDIOGRAM COMPLETE: WEIGHTICAEL: 4405.67 [oz_av]

## 2015-07-21 LAB — HEMOGLOBIN A1C
Hgb A1c MFr Bld: 6.1 % — ABNORMAL HIGH (ref 4.8–5.6)
MEAN PLASMA GLUCOSE: 128 mg/dL

## 2015-07-21 LAB — GLUCOSE, CAPILLARY: GLUCOSE-CAPILLARY: 115 mg/dL — AB (ref 65–99)

## 2015-07-21 MED ORDER — VALPROATE SODIUM 500 MG/5ML IV SOLN
300.0000 mg | Freq: Four times a day (QID) | INTRAVENOUS | Status: DC
  Administered 2015-07-21 – 2015-07-24 (×10): 300 mg via INTRAVENOUS
  Filled 2015-07-21 (×13): qty 3

## 2015-07-21 MED ORDER — BACLOFEN 10 MG PO TABS
20.0000 mg | ORAL_TABLET | Freq: Three times a day (TID) | ORAL | Status: DC
  Administered 2015-07-21 – 2015-07-23 (×7): 20 mg via ORAL
  Filled 2015-07-21 (×8): qty 2

## 2015-07-21 MED ORDER — HYDRALAZINE HCL 50 MG PO TABS
100.0000 mg | ORAL_TABLET | Freq: Three times a day (TID) | ORAL | Status: DC
  Administered 2015-07-21 – 2015-07-23 (×6): 100 mg via ORAL
  Filled 2015-07-21 (×6): qty 2

## 2015-07-21 MED ORDER — CLONAZEPAM 0.5 MG PO TABS
0.5000 mg | ORAL_TABLET | Freq: Two times a day (BID) | ORAL | Status: DC
  Administered 2015-07-21 – 2015-07-27 (×11): 0.5 mg via ORAL
  Filled 2015-07-21 (×11): qty 1

## 2015-07-21 MED ORDER — SODIUM CHLORIDE 0.9 % IV SOLN
250.0000 mg | Freq: Four times a day (QID) | INTRAVENOUS | Status: DC
  Administered 2015-07-21 – 2015-07-22 (×3): 250 mg via INTRAVENOUS
  Filled 2015-07-21 (×6): qty 2

## 2015-07-21 MED ORDER — METOCLOPRAMIDE HCL 5 MG/ML IJ SOLN
10.0000 mg | Freq: Once | INTRAMUSCULAR | Status: AC
  Administered 2015-07-21: 10 mg via INTRAVENOUS
  Filled 2015-07-21: qty 2

## 2015-07-21 MED ORDER — GADOBENATE DIMEGLUMINE 529 MG/ML IV SOLN
20.0000 mL | Freq: Once | INTRAVENOUS | Status: AC | PRN
  Administered 2015-07-21: 20 mL via INTRAVENOUS

## 2015-07-21 NOTE — Progress Notes (Signed)
  Echocardiogram 2D Echocardiogram has been performed.  Alexander Gonzalez, Alexander Gonzalez 07/21/2015, 9:33 AM

## 2015-07-21 NOTE — Progress Notes (Signed)
OT Cancellation Note  Patient Details Name: Alexander Gonzalez MRN: [id number] DOB: 09/26/1950   Cancelled Treatment:    Reason Eval/Treat Not Completed: Pain limiting ability to participate. 2nd attempt at OT eval today. Pt found in bed yelling and crying while holding his head. Reports pain to be a 10/10. RN present in room. Will follow up for OT eval tomorrow.  Gaye AlkenBailey A Seve Monette M.S., OTR/L Pager: [phone]  07/21/2015, 4:35 PM

## 2015-07-21 NOTE — Progress Notes (Signed)
[facility] PROGRESS NOTE  Alexander Gonzalez ZOX:[id number] DOB: 03/06/1951 DOA: 07/19/2015   PCP: Katy Apo, MD     Assessment/Plan: Principal Problem:   Severe headache Active Problems:   HTN (hypertension)   Hyperlipidemia   Abnormal computed tomography angiography of brain   CKD (chronic kidney disease), stage III   Acute hyperglycemia   Basilar artery stenosis   Hypertensive emergency   Occlusion and stenosis of basilar artery    Brief summary 65 y.o. male with a Past Medical History of HTN, OA, TIA, HLD who presents with HA and R sided weakness and abnormal basilar artery circulation. IR to perform arteriogram and possible intervention. Neuro and IR following  Assessment and plan  Severe headache/ Abnormal computed tomography angiography of brain -Patient presented with severe headache transient right sided weakness (primarily hand) and elevated blood pressure and reported compliance with medications-imaging reveals basilar circulatory abnormalities-CT head without acute stroke and NIHSS was 0 Neurology consultation obtained, interventional radiology also consulted -IR recommended cerebral arteriogram with possibility of pursuing intracranial vascular intervention if indicated (concern over possible aneurysm), 4/21, however unable to be completed due to intractable headache Headache management per neurology, IV Depacon to every 6 hours, IV Solu-Medrol 125 g every 6 hours, baclofen 10 mg 3 times a day  MRI, MRV for severe headache   HTN (hypertension), uncontrolled Add hydralazine PO -Continue preadmission Norvasc and Cozaar -Hydralazine prn 2-D echo results LV EF: 65% - 70%   CKD (chronic kidney disease), stage III -Likely related to hypertension -Obtain urinalysis to check for proteinuria   Hyperlipidemia -Continue preadmission Zocor   Acute hyperglycemia Hemoglobin A1c 6.1  Chest pain , noncardiac  Like esophageal spasma, EKG  unchanged  Cardiac enzymes negative D-dimer negative   DVT prophylaxsis Lovenox  Code Status:  Full code    Code Status Orders       Family Communication: Discussed in detail with the patient, all imaging results, lab results explained to the patient   Disposition Plan:  Anticipate discharge in 2-3 days      Consultants:  Neurology  Interventional radiology  *  Procedures:  None  Antibiotics: Anti-infectives    None         HPI/Subjective: Still complaining of severe headache and nausea this morning, chest pain associated with cough   Objective: Filed Vitals:   07/20/15 1823 07/20/15 2214 07/21/15 0238 07/21/15 0647  BP: 160/80 171/87 148/83 185/85  Pulse: 71 77 69 81  Temp: 98.2 F (36.8 C) 98.1 F (36.7 C) 98 F (36.7 C) 98.1 F (36.7 C)  TempSrc: Oral Oral Oral Oral  Resp: 18 18 18 16   Weight:      SpO2: 100% 98% 98% 96%    Intake/Output Summary (Last 24 hours) at 07/21/15 0901 Last data filed at 07/21/15 0649  Gross per 24 hour  Intake      0 ml  Output    800 ml  Net   -800 ml    Exam:  Examination:  General exam: Appears calm and comfortable  Respiratory system: Clear to auscultation. Respiratory effort normal. Cardiovascular system: S1 & S2 heard, RRR. No JVD, murmurs, rubs, gallops or clicks. No pedal edema. Gastrointestinal system: Abdomen is nondistended, soft and nontender. No organomegaly or masses felt. Normal bowel sounds heard. Central nervous system: Alert and oriented. No focal neurological deficits. Extremities: Symmetric 5 x 5 power. Skin: No rashes, lesions or ulcers Psychiatry: Judgement and insight appear normal. Mood & affect  appropriate.     Data Reviewed: I have personally reviewed following labs and imaging studies  Micro Results No results found for this or any previous visit (from the past 240 hour(s)).  Radiology Reports Ct Angio Head W/cm &/or Wo Cm  07/19/2015  CLINICAL DATA:  65 year old male  code stroke. Severe headache and weakness. Right side weakness. Initial encounter. EXAM: CT ANGIOGRAPHY HEAD AND NECK TECHNIQUE: Multidetector CT imaging of the head and neck was performed using the standard protocol during bolus administration of intravenous contrast. Multiplanar CT image reconstructions and MIPs were obtained to evaluate the vascular anatomy. Carotid stenosis measurements (when applicable) are obtained utilizing NASCET criteria, using the distal internal carotid diameter as the denominator. CONTRAST:  80 mL Isovue 370. COMPARISON:  Head CT without contrast 1123 hours today. Brain MRI 01/14/2015. FINDINGS: CTA NECK Skeleton: Intermittent poor dentition. Degenerative changes in the cervical and visualized thoracic spine. No acute osseous abnormality identified. Mild to moderate paranasal sinus mucosal thickening with chronic periosteal thickening suggesting chronic sinus disease. Other neck: Mediastinal lipomatosis. No superior mediastinal lymphadenopathy. Negative visualized lung apices aside from atelectasis. Negative thyroid, larynx, pharynx, parapharyngeal spaces, retropharyngeal space, sublingual space, submandibular glands and parotid glands. No cervical lymphadenopathy. Aortic arch: Bovine arch configuration. Minimal to mild calcified arch atherosclerosis. No great vessel origin stenosis. Right carotid system: Mildly tortuous right CCA. Bulky soft and to a lesser extent calcified plaque at the right carotid bifurcation resulting in right ICA origin stenosis of 65 % with respect to the distal vessel. Otherwise negative cervical right ICA. Left carotid system: Tortuous proximal left CCA. Bovine type left CCA origin. At the left carotid bifurcation there is circumferential soft plaque at the left ICA origin, but less than 50 % stenosis with respect to the distal vessel results (series 9, image 119). Otherwise negative cervical left ICA. Vertebral arteries: No proximal right subclavian artery  stenosis. Soft plaque with mild stenosis at the right vertebral artery origin (series 9, image 141). Dominant right vertebral artery otherwise is widely patent to the skullbase. No proximal left subclavian artery stenosis. Non dominant left vertebral artery with a normal origin. Tortuous left V1 segment. No left vertebral artery stenosis to the skullbase. CTA HEAD Posterior circulation: Dominant right vertebral artery with V4 segment calcified plaque resulting an mild to moderate stenosis. Right PICA origin is patent. Non dominant left vertebral artery terminates in the left PICA. Calcified plaque at the basilar artery origin not resulting in stenosis, but there is widespread basilar artery irregularity related to soft atherosclerotic plaque such that a beaded appearance occurs throughout the basilar artery (series 12 images 27 and 28). No high-grade stenosis occurs. The basilar tip remains patent. SCA and MCA origins are patent, but there is moderate to severe stenosis at the left PCA origin. There is additional moderate tandem left P2 segment stenosis. Bilateral distal PCA branches remain patent. Anterior circulation: Soft plaque in the left ICA at the skullbase resulting in up to 50 % stenosis with respect to the distal vessel. Left ICA siphon is patent with mild cavernous and moderate supraclinoid segment calcified plaque not resulting in stenosis. Patent left ICA terminus. Normal left ophthalmic artery origin. Mild cavernous and moderate supra clinoid right ICA calcified plaque without stenosis. Normal right ophthalmic artery origin. Patent right ICA terminus. Right MCA and ACA origins are within normal limits. Fenestrated anterior communicating artery is present. Bilateral A2 and distal ACA branches are within normal limits. Right MCA M1 segment, bifurcation, and right MCA branches are within  normal limits. Left ACA origin is highly stenotic such that there is minimal if any flow in the left A1 segment. Still,  the left A2 is adequately supplied by the anterior communicating artery. Left MCA origin, M1 segment, bifurcation, and left MCA branches are within normal limits. Venous sinuses: Patent. Anatomic variants: Dominant right vertebral artery. Left vertebral artery terminates in PICA. Bovine type arch configuration. IMPRESSION: 1. Negative for emergent large vessel occlusion. 2. Bulky soft plaque at the right carotid bifurcation resulting in 65% stenosis of the right ICA origin. 3. Severe intracranial basilar artery atherosclerosis, resulting in a beaded appearance of the vessel throughout its course, but no hemodynamically significant basilar artery stenosis. There is superimposed mild to moderate stenosis of the right vertebral artery V4 segment which supplies the basilar. The non dominant left vertebral artery terminates in the left PICA. 4. Severe stenosis at the left PCA origin with preserved distal flow. 5. Severe stenosis at the left ACA origin with adequately reconstituted A2 segment flow via the anterior communicating artery. 6. Other intracranial and extracranial atherosclerosis is not hemodynamically significant. Electronically Signed   By: Odessa Fleming M.D.   On: 07/19/2015 12:51   Dg Chest 2 View  07/20/2015  CLINICAL DATA:  Dry cough and severe headache yesterday, history hypertension EXAM: CHEST  2 VIEW COMPARISON:  None FINDINGS: Lordotic positioning with slight rotation to the RIGHT on AP view. Normal heart size, mediastinal contours and pulmonary vascularity. Elevation of RIGHT diaphragm with RIGHT basilar atelectasis. Lungs otherwise clear. No pleural effusion or pneumothorax. IMPRESSION: RIGHT basilar atelectasis. Electronically Signed   By: Ulyses Southward M.D.   On: 07/20/2015 11:16   Ct Angio Neck W/cm &/or Wo/cm  07/19/2015  CLINICAL DATA:  65 year old male code stroke. Severe headache and weakness. Right side weakness. Initial encounter. EXAM: CT ANGIOGRAPHY HEAD AND NECK TECHNIQUE: Multidetector CT  imaging of the head and neck was performed using the standard protocol during bolus administration of intravenous contrast. Multiplanar CT image reconstructions and MIPs were obtained to evaluate the vascular anatomy. Carotid stenosis measurements (when applicable) are obtained utilizing NASCET criteria, using the distal internal carotid diameter as the denominator. CONTRAST:  80 mL Isovue 370. COMPARISON:  Head CT without contrast 1123 hours today. Brain MRI 01/14/2015. FINDINGS: CTA NECK Skeleton: Intermittent poor dentition. Degenerative changes in the cervical and visualized thoracic spine. No acute osseous abnormality identified. Mild to moderate paranasal sinus mucosal thickening with chronic periosteal thickening suggesting chronic sinus disease. Other neck: Mediastinal lipomatosis. No superior mediastinal lymphadenopathy. Negative visualized lung apices aside from atelectasis. Negative thyroid, larynx, pharynx, parapharyngeal spaces, retropharyngeal space, sublingual space, submandibular glands and parotid glands. No cervical lymphadenopathy. Aortic arch: Bovine arch configuration. Minimal to mild calcified arch atherosclerosis. No great vessel origin stenosis. Right carotid system: Mildly tortuous right CCA. Bulky soft and to a lesser extent calcified plaque at the right carotid bifurcation resulting in right ICA origin stenosis of 65 % with respect to the distal vessel. Otherwise negative cervical right ICA. Left carotid system: Tortuous proximal left CCA. Bovine type left CCA origin. At the left carotid bifurcation there is circumferential soft plaque at the left ICA origin, but less than 50 % stenosis with respect to the distal vessel results (series 9, image 119). Otherwise negative cervical left ICA. Vertebral arteries: No proximal right subclavian artery stenosis. Soft plaque with mild stenosis at the right vertebral artery origin (series 9, image 141). Dominant right vertebral artery otherwise is  widely patent to the skullbase. No proximal  left subclavian artery stenosis. Non dominant left vertebral artery with a normal origin. Tortuous left V1 segment. No left vertebral artery stenosis to the skullbase. CTA HEAD Posterior circulation: Dominant right vertebral artery with V4 segment calcified plaque resulting an mild to moderate stenosis. Right PICA origin is patent. Non dominant left vertebral artery terminates in the left PICA. Calcified plaque at the basilar artery origin not resulting in stenosis, but there is widespread basilar artery irregularity related to soft atherosclerotic plaque such that a beaded appearance occurs throughout the basilar artery (series 12 images 27 and 28). No high-grade stenosis occurs. The basilar tip remains patent. SCA and MCA origins are patent, but there is moderate to severe stenosis at the left PCA origin. There is additional moderate tandem left P2 segment stenosis. Bilateral distal PCA branches remain patent. Anterior circulation: Soft plaque in the left ICA at the skullbase resulting in up to 50 % stenosis with respect to the distal vessel. Left ICA siphon is patent with mild cavernous and moderate supraclinoid segment calcified plaque not resulting in stenosis. Patent left ICA terminus. Normal left ophthalmic artery origin. Mild cavernous and moderate supra clinoid right ICA calcified plaque without stenosis. Normal right ophthalmic artery origin. Patent right ICA terminus. Right MCA and ACA origins are within normal limits. Fenestrated anterior communicating artery is present. Bilateral A2 and distal ACA branches are within normal limits. Right MCA M1 segment, bifurcation, and right MCA branches are within normal limits. Left ACA origin is highly stenotic such that there is minimal if any flow in the left A1 segment. Still, the left A2 is adequately supplied by the anterior communicating artery. Left MCA origin, M1 segment, bifurcation, and left MCA branches are  within normal limits. Venous sinuses: Patent. Anatomic variants: Dominant right vertebral artery. Left vertebral artery terminates in PICA. Bovine type arch configuration. IMPRESSION: 1. Negative for emergent large vessel occlusion. 2. Bulky soft plaque at the right carotid bifurcation resulting in 65% stenosis of the right ICA origin. 3. Severe intracranial basilar artery atherosclerosis, resulting in a beaded appearance of the vessel throughout its course, but no hemodynamically significant basilar artery stenosis. There is superimposed mild to moderate stenosis of the right vertebral artery V4 segment which supplies the basilar. The non dominant left vertebral artery terminates in the left PICA. 4. Severe stenosis at the left PCA origin with preserved distal flow. 5. Severe stenosis at the left ACA origin with adequately reconstituted A2 segment flow via the anterior communicating artery. 6. Other intracranial and extracranial atherosclerosis is not hemodynamically significant. Electronically Signed   By: Odessa Fleming M.D.   On: 07/19/2015 12:51   Ct Head Code Stroke W/o Cm  07/19/2015  CLINICAL DATA:  Severe headache and right-sided weakness today. EXAM: CT HEAD WITHOUT CONTRAST TECHNIQUE: Contiguous axial images were obtained from the base of the skull through the vertex without intravenous contrast. COMPARISON:  Head CT 01/14/2015 and MRI brain 01/14/2015 FINDINGS: Stable age related cerebral atrophy, ventriculomegaly and periventricular white matter disease. No findings for acute hemispheric infarction an or intracranial hemorrhage. No extra-axial fluid collections are identified. No mass lesions. The brainstem and cerebellum are grossly normal in stable. Vascular calcifications are noted. No acute bony findings. Extensive mucoperiosteal thickening involving the maxillary and ethmoid sinuses bilaterally. The mastoid air cells and middle ear cavities are clear. The globes are intact. IMPRESSION: No acute  intracranial findings. Specifically, no findings for hemispheric infarction or intracranial hemorrhage. Stable age related cerebral atrophy and mild ventriculomegaly. Electronically Signed  By: Rudie Meyer M.D.   On: 07/19/2015 11:29     CBC  Recent Labs Lab 07/19/15 1114 07/19/15 1122 07/20/15 0605 07/21/15 0209  WBC 5.9  --  11.5* 15.4*  HGB 13.7 16.0 14.2 13.9  HCT 42.2 47.0 43.4 43.5  PLT 177  --  218 227  MCV 85.9  --  85.4 88.4  MCH 27.9  --  28.0 28.3  MCHC 32.5  --  32.7 32.0  RDW 13.6  --  13.5 13.9  LYMPHSABS 2.1  --   --   --   MONOABS 0.4  --   --   --   EOSABS 0.3  --   --   --   BASOSABS 0.0  --   --   --     Chemistries   Recent Labs Lab 07/19/15 1114 07/19/15 1122 07/20/15 0605 07/21/15 0209  NA 140 140 136 138  K 4.5 4.4 4.4 4.8  CL 105 104 101 103  CO2 22  --  23 24  GLUCOSE 157* 153* 147* 133*  BUN 14 18 20  24*  CREATININE 1.44* 1.40* 1.27* 1.33*  CALCIUM 9.2  --  9.2 9.1  AST 26  --   --  17  ALT 21  --   --  17  ALKPHOS 59  --   --  54  BILITOT 0.9  --   --  0.5   ------------------------------------------------------------------------------------------------------------------ CrCl cannot be calculated (Unknown ideal weight.). ------------------------------------------------------------------------------------------------------------------  Recent Labs  07/20/15 0605  HGBA1C 6.1*   ------------------------------------------------------------------------------------------------------------------  Recent Labs  07/20/15 0605  CHOL 200  HDL 55  LDLCALC 122*  TRIG 116  CHOLHDL 3.6   ------------------------------------------------------------------------------------------------------------------ No results for input(s): TSH, T4TOTAL, T3FREE, THYROIDAB in the last 72 hours.  Invalid input(s): FREET3 ------------------------------------------------------------------------------------------------------------------ No results for  input(s): VITAMINB12, FOLATE, FERRITIN, TIBC, IRON, RETICCTPCT in the last 72 hours.  Coagulation profile  Recent Labs Lab 07/19/15 1114  INR 1.10     Recent Labs  07/20/15 1211  DDIMER <0.27    Cardiac Enzymes  Recent Labs Lab 07/20/15 1211  TROPONINI <0.03   ------------------------------------------------------------------------------------------------------------------ Invalid input(s): POCBNP   CBG:  Recent Labs Lab 07/19/15 1137  GLUCAP 131*       Studies: Ct Angio Head W/cm &/or Wo Cm  07/19/2015  CLINICAL DATA:  65 year old male code stroke. Severe headache and weakness. Right side weakness. Initial encounter. EXAM: CT ANGIOGRAPHY HEAD AND NECK TECHNIQUE: Multidetector CT imaging of the head and neck was performed using the standard protocol during bolus administration of intravenous contrast. Multiplanar CT image reconstructions and MIPs were obtained to evaluate the vascular anatomy. Carotid stenosis measurements (when applicable) are obtained utilizing NASCET criteria, using the distal internal carotid diameter as the denominator. CONTRAST:  80 mL Isovue 370. COMPARISON:  Head CT without contrast 1123 hours today. Brain MRI 01/14/2015. FINDINGS: CTA NECK Skeleton: Intermittent poor dentition. Degenerative changes in the cervical and visualized thoracic spine. No acute osseous abnormality identified. Mild to moderate paranasal sinus mucosal thickening with chronic periosteal thickening suggesting chronic sinus disease. Other neck: Mediastinal lipomatosis. No superior mediastinal lymphadenopathy. Negative visualized lung apices aside from atelectasis. Negative thyroid, larynx, pharynx, parapharyngeal spaces, retropharyngeal space, sublingual space, submandibular glands and parotid glands. No cervical lymphadenopathy. Aortic arch: Bovine arch configuration. Minimal to mild calcified arch atherosclerosis. No great vessel origin stenosis. Right carotid system: Mildly  tortuous right CCA. Bulky soft and to a lesser extent calcified plaque at the right carotid bifurcation resulting in  right ICA origin stenosis of 65 % with respect to the distal vessel. Otherwise negative cervical right ICA. Left carotid system: Tortuous proximal left CCA. Bovine type left CCA origin. At the left carotid bifurcation there is circumferential soft plaque at the left ICA origin, but less than 50 % stenosis with respect to the distal vessel results (series 9, image 119). Otherwise negative cervical left ICA. Vertebral arteries: No proximal right subclavian artery stenosis. Soft plaque with mild stenosis at the right vertebral artery origin (series 9, image 141). Dominant right vertebral artery otherwise is widely patent to the skullbase. No proximal left subclavian artery stenosis. Non dominant left vertebral artery with a normal origin. Tortuous left V1 segment. No left vertebral artery stenosis to the skullbase. CTA HEAD Posterior circulation: Dominant right vertebral artery with V4 segment calcified plaque resulting an mild to moderate stenosis. Right PICA origin is patent. Non dominant left vertebral artery terminates in the left PICA. Calcified plaque at the basilar artery origin not resulting in stenosis, but there is widespread basilar artery irregularity related to soft atherosclerotic plaque such that a beaded appearance occurs throughout the basilar artery (series 12 images 27 and 28). No high-grade stenosis occurs. The basilar tip remains patent. SCA and MCA origins are patent, but there is moderate to severe stenosis at the left PCA origin. There is additional moderate tandem left P2 segment stenosis. Bilateral distal PCA branches remain patent. Anterior circulation: Soft plaque in the left ICA at the skullbase resulting in up to 50 % stenosis with respect to the distal vessel. Left ICA siphon is patent with mild cavernous and moderate supraclinoid segment calcified plaque not resulting in  stenosis. Patent left ICA terminus. Normal left ophthalmic artery origin. Mild cavernous and moderate supra clinoid right ICA calcified plaque without stenosis. Normal right ophthalmic artery origin. Patent right ICA terminus. Right MCA and ACA origins are within normal limits. Fenestrated anterior communicating artery is present. Bilateral A2 and distal ACA branches are within normal limits. Right MCA M1 segment, bifurcation, and right MCA branches are within normal limits. Left ACA origin is highly stenotic such that there is minimal if any flow in the left A1 segment. Still, the left A2 is adequately supplied by the anterior communicating artery. Left MCA origin, M1 segment, bifurcation, and left MCA branches are within normal limits. Venous sinuses: Patent. Anatomic variants: Dominant right vertebral artery. Left vertebral artery terminates in PICA. Bovine type arch configuration. IMPRESSION: 1. Negative for emergent large vessel occlusion. 2. Bulky soft plaque at the right carotid bifurcation resulting in 65% stenosis of the right ICA origin. 3. Severe intracranial basilar artery atherosclerosis, resulting in a beaded appearance of the vessel throughout its course, but no hemodynamically significant basilar artery stenosis. There is superimposed mild to moderate stenosis of the right vertebral artery V4 segment which supplies the basilar. The non dominant left vertebral artery terminates in the left PICA. 4. Severe stenosis at the left PCA origin with preserved distal flow. 5. Severe stenosis at the left ACA origin with adequately reconstituted A2 segment flow via the anterior communicating artery. 6. Other intracranial and extracranial atherosclerosis is not hemodynamically significant. Electronically Signed   By: Odessa Fleming M.D.   On: 07/19/2015 12:51   Dg Chest 2 View  07/20/2015  CLINICAL DATA:  Dry cough and severe headache yesterday, history hypertension EXAM: CHEST  2 VIEW COMPARISON:  None FINDINGS:  Lordotic positioning with slight rotation to the RIGHT on AP view. Normal heart size, mediastinal contours and pulmonary  vascularity. Elevation of RIGHT diaphragm with RIGHT basilar atelectasis. Lungs otherwise clear. No pleural effusion or pneumothorax. IMPRESSION: RIGHT basilar atelectasis. Electronically Signed   By: Ulyses Southward M.D.   On: 07/20/2015 11:16   Ct Angio Neck W/cm &/or Wo/cm  07/19/2015  CLINICAL DATA:  65 year old male code stroke. Severe headache and weakness. Right side weakness. Initial encounter. EXAM: CT ANGIOGRAPHY HEAD AND NECK TECHNIQUE: Multidetector CT imaging of the head and neck was performed using the standard protocol during bolus administration of intravenous contrast. Multiplanar CT image reconstructions and MIPs were obtained to evaluate the vascular anatomy. Carotid stenosis measurements (when applicable) are obtained utilizing NASCET criteria, using the distal internal carotid diameter as the denominator. CONTRAST:  80 mL Isovue 370. COMPARISON:  Head CT without contrast 1123 hours today. Brain MRI 01/14/2015. FINDINGS: CTA NECK Skeleton: Intermittent poor dentition. Degenerative changes in the cervical and visualized thoracic spine. No acute osseous abnormality identified. Mild to moderate paranasal sinus mucosal thickening with chronic periosteal thickening suggesting chronic sinus disease. Other neck: Mediastinal lipomatosis. No superior mediastinal lymphadenopathy. Negative visualized lung apices aside from atelectasis. Negative thyroid, larynx, pharynx, parapharyngeal spaces, retropharyngeal space, sublingual space, submandibular glands and parotid glands. No cervical lymphadenopathy. Aortic arch: Bovine arch configuration. Minimal to mild calcified arch atherosclerosis. No great vessel origin stenosis. Right carotid system: Mildly tortuous right CCA. Bulky soft and to a lesser extent calcified plaque at the right carotid bifurcation resulting in right ICA origin stenosis  of 65 % with respect to the distal vessel. Otherwise negative cervical right ICA. Left carotid system: Tortuous proximal left CCA. Bovine type left CCA origin. At the left carotid bifurcation there is circumferential soft plaque at the left ICA origin, but less than 50 % stenosis with respect to the distal vessel results (series 9, image 119). Otherwise negative cervical left ICA. Vertebral arteries: No proximal right subclavian artery stenosis. Soft plaque with mild stenosis at the right vertebral artery origin (series 9, image 141). Dominant right vertebral artery otherwise is widely patent to the skullbase. No proximal left subclavian artery stenosis. Non dominant left vertebral artery with a normal origin. Tortuous left V1 segment. No left vertebral artery stenosis to the skullbase. CTA HEAD Posterior circulation: Dominant right vertebral artery with V4 segment calcified plaque resulting an mild to moderate stenosis. Right PICA origin is patent. Non dominant left vertebral artery terminates in the left PICA. Calcified plaque at the basilar artery origin not resulting in stenosis, but there is widespread basilar artery irregularity related to soft atherosclerotic plaque such that a beaded appearance occurs throughout the basilar artery (series 12 images 27 and 28). No high-grade stenosis occurs. The basilar tip remains patent. SCA and MCA origins are patent, but there is moderate to severe stenosis at the left PCA origin. There is additional moderate tandem left P2 segment stenosis. Bilateral distal PCA branches remain patent. Anterior circulation: Soft plaque in the left ICA at the skullbase resulting in up to 50 % stenosis with respect to the distal vessel. Left ICA siphon is patent with mild cavernous and moderate supraclinoid segment calcified plaque not resulting in stenosis. Patent left ICA terminus. Normal left ophthalmic artery origin. Mild cavernous and moderate supra clinoid right ICA calcified plaque  without stenosis. Normal right ophthalmic artery origin. Patent right ICA terminus. Right MCA and ACA origins are within normal limits. Fenestrated anterior communicating artery is present. Bilateral A2 and distal ACA branches are within normal limits. Right MCA M1 segment, bifurcation, and right MCA branches are within  normal limits. Left ACA origin is highly stenotic such that there is minimal if any flow in the left A1 segment. Still, the left A2 is adequately supplied by the anterior communicating artery. Left MCA origin, M1 segment, bifurcation, and left MCA branches are within normal limits. Venous sinuses: Patent. Anatomic variants: Dominant right vertebral artery. Left vertebral artery terminates in PICA. Bovine type arch configuration. IMPRESSION: 1. Negative for emergent large vessel occlusion. 2. Bulky soft plaque at the right carotid bifurcation resulting in 65% stenosis of the right ICA origin. 3. Severe intracranial basilar artery atherosclerosis, resulting in a beaded appearance of the vessel throughout its course, but no hemodynamically significant basilar artery stenosis. There is superimposed mild to moderate stenosis of the right vertebral artery V4 segment which supplies the basilar. The non dominant left vertebral artery terminates in the left PICA. 4. Severe stenosis at the left PCA origin with preserved distal flow. 5. Severe stenosis at the left ACA origin with adequately reconstituted A2 segment flow via the anterior communicating artery. 6. Other intracranial and extracranial atherosclerosis is not hemodynamically significant. Electronically Signed   By: Odessa FlemingH  Hall M.D.   On: 07/19/2015 12:51   Ct Head Code Stroke W/o Cm  07/19/2015  CLINICAL DATA:  Severe headache and right-sided weakness today. EXAM: CT HEAD WITHOUT CONTRAST TECHNIQUE: Contiguous axial images were obtained from the base of the skull through the vertex without intravenous contrast. COMPARISON:  Head CT 01/14/2015 and MRI  brain 01/14/2015 FINDINGS: Stable age related cerebral atrophy, ventriculomegaly and periventricular white matter disease. No findings for acute hemispheric infarction an or intracranial hemorrhage. No extra-axial fluid collections are identified. No mass lesions. The brainstem and cerebellum are grossly normal in stable. Vascular calcifications are noted. No acute bony findings. Extensive mucoperiosteal thickening involving the maxillary and ethmoid sinuses bilaterally. The mastoid air cells and middle ear cavities are clear. The globes are intact. IMPRESSION: No acute intracranial findings. Specifically, no findings for hemispheric infarction or intracranial hemorrhage. Stable age related cerebral atrophy and mild ventriculomegaly. Electronically Signed   By: Rudie MeyerP.  Gallerani M.D.   On: 07/19/2015 11:29      Lab Results  Component Value Date   HGBA1C 6.1* 07/20/2015   HGBA1C 5.9* 01/15/2015   Lab Results  Component Value Date   LDLCALC 122* 07/20/2015   CREATININE 1.33* 07/21/2015       Scheduled Meds: . amLODipine  10 mg Oral Daily  . aspirin EC  81 mg Oral Daily  . baclofen  10 mg Oral TID  . enoxaparin (LOVENOX) injection  40 mg Subcutaneous Q24H  . losartan  100 mg Oral Daily  . methylPREDNISolone (SOLU-MEDROL) injection  125 mg Intravenous Q6H  . pantoprazole (PROTONIX) IV  40 mg Intravenous Q24H  . simvastatin  20 mg Oral QPM  . valproate sodium  250 mg Intravenous Q6H   Continuous Infusions:    LOS: 1 day     Time spent: >30 MINS    [facility] Ana[facility] Medical CenterBROL,Deren Degrazia  [facility] Pager [phone]. If 7PM-7AM, please contact night-coverage at [email or web address], password [facility] [email or web address] 07/21/2015, 9:01 AM  LOS: 1 day

## 2015-07-21 NOTE — Progress Notes (Signed)
Patient ID: Alexander Gonzalez, male   DOB: 10/20/1950, 65 y.o.   MRN: [id number] Will plan for cerebral angiogram as an outpatient.  The patient can be discharged from our standpoint and we will get him set up for an outpatient angiogram.  Cierah Crader E 11:35 AM

## 2015-07-21 NOTE — Progress Notes (Signed)
PT Cancellation Note  Patient Details Name: Alexander Gonzalez MRN: [id number] DOB: 01/14/1951   Cancelled Treatment:    Reason Eval/Treat Not Completed: Patient at procedure or test/unavailable. Pt off floor for testing (MRA).   Ilda FoilGarrow, Varina Hulon Rene 07/21/2015, 10:58 AM

## 2015-07-21 NOTE — Progress Notes (Signed)
PT Cancellation Note  Patient Details Name: Alexander Gonzalez MRN: [id number] DOB: 01/25/1951   Cancelled Treatment:    Reason Eval/Treat Not Completed: Pain limiting ability to participate. Pt in a darkened room, lying in bed with washcloth on his head upon arrival. He reports RN just gave him pain medicine and his pain is too great to work with PT. PT to re-attempt tomorrow.   Ilda FoilGarrow, Magdelena Kinsella Rene 07/21/2015, 4:05 PM

## 2015-07-21 NOTE — Progress Notes (Signed)
OT Cancellation Note  Patient Details Name: Alexander Gonzalez MRN: [id number] DOB: 04/26/1950   Cancelled Treatment:    Reason Eval/Treat Not Completed: Patient at procedure or test/ unavailable (Transport present to take pt to test). Will follow up for OT eval as time allows.  Gaye AlkenBailey A Farran Amsden M.S., OTR/L Pager: [phone]  07/21/2015, 10:34 AM

## 2015-07-22 LAB — GLUCOSE, CAPILLARY
GLUCOSE-CAPILLARY: 130 mg/dL — AB (ref 65–99)
GLUCOSE-CAPILLARY: 114 mg/dL — AB (ref 65–99)
Glucose-Capillary: 120 mg/dL — ABNORMAL HIGH (ref 65–99)
Glucose-Capillary: 121 mg/dL — ABNORMAL HIGH (ref 65–99)

## 2015-07-22 LAB — CBC
HCT: 46.7 % (ref 39.0–52.0)
Hemoglobin: 14.7 g/dL (ref 13.0–17.0)
MCH: 27.6 pg (ref 26.0–34.0)
MCHC: 31.5 g/dL (ref 30.0–36.0)
MCV: 87.8 fL (ref 78.0–100.0)
PLATELETS: 246 10*3/uL (ref 150–400)
RBC: 5.32 MIL/uL (ref 4.22–5.81)
RDW: 14 % (ref 11.5–15.5)
WBC: 15.5 10*3/uL — ABNORMAL HIGH (ref 4.0–10.5)

## 2015-07-22 LAB — COMPREHENSIVE METABOLIC PANEL
ALBUMIN: 3.4 g/dL — AB (ref 3.5–5.0)
ALK PHOS: 52 U/L (ref 38–126)
ALT: 18 U/L (ref 17–63)
ANION GAP: 14 (ref 5–15)
AST: 24 U/L (ref 15–41)
BILIRUBIN TOTAL: 0.8 mg/dL (ref 0.3–1.2)
BUN: 37 mg/dL — ABNORMAL HIGH (ref 6–20)
CALCIUM: 9.2 mg/dL (ref 8.9–10.3)
CO2: 22 mmol/L (ref 22–32)
CREATININE: 1.53 mg/dL — AB (ref 0.61–1.24)
Chloride: 101 mmol/L (ref 101–111)
GFR calc non Af Amer: 46 mL/min — ABNORMAL LOW (ref >60)
GFR, EST AFRICAN AMERICAN: 53 mL/min — AB (ref >60)
GLUCOSE: 138 mg/dL — AB (ref 65–99)
Potassium: 4.6 mmol/L (ref 3.5–5.1)
SODIUM: 137 mmol/L (ref 135–145)
TOTAL PROTEIN: 7.9 g/dL (ref 6.5–8.1)

## 2015-07-22 MED ORDER — LORAZEPAM 0.5 MG PO TABS: 0.5000 mg | ORAL_TABLET | ORAL | Status: DC

## 2015-07-22 MED ORDER — METOCLOPRAMIDE HCL 5 MG/ML IJ SOLN
10.0000 mg | Freq: Once | INTRAMUSCULAR | Status: AC
  Administered 2015-07-22: 10 mg via INTRAVENOUS
  Filled 2015-07-22: qty 2

## 2015-07-22 MED ORDER — LORAZEPAM 2 MG/ML IJ SOLN
0.5000 mg | INTRAMUSCULAR | Status: AC
  Administered 2015-07-22: 0.5 mg via INTRAVENOUS
  Filled 2015-07-22: qty 1

## 2015-07-22 MED ORDER — PANTOPRAZOLE SODIUM 40 MG PO TBEC
40.0000 mg | DELAYED_RELEASE_TABLET | Freq: Every day | ORAL | Status: DC
  Administered 2015-07-23 – 2015-07-27 (×4): 40 mg via ORAL
  Filled 2015-07-22 (×4): qty 1

## 2015-07-22 MED ORDER — METHYLPREDNISOLONE SODIUM SUCC 125 MG IJ SOLR
125.0000 mg | Freq: Four times a day (QID) | INTRAMUSCULAR | Status: DC
Start: 2015-07-22 — End: 2015-07-23
  Administered 2015-07-22 – 2015-07-23 (×5): 125 mg via INTRAVENOUS
  Filled 2015-07-22 (×5): qty 2

## 2015-07-22 NOTE — Procedures (Signed)
LP Procedure Note:  Patient has been seen and examined.  Chart has been reviewed.  LP is being performed to evaluate causes for severe HA, r/o e/o SAH, or infections.   Procedure has been explained to patient and his wife, including risks and benefits.  Consent has been signed by his wife, and witnessed by staff nurse.   Blood pressure 147/77, pulse 96, temperature 98.7 F (37.1 C), temperature source Oral, resp. rate 20, weight 124.9 kg (275 lb 5.7 oz), SpO2 97 %.   Current facility-administered medications:  .  acetaminophen (TYLENOL) tablet 1,000 mg, 1,000 mg, Oral, Q6H PRN, Ozella Rocksavid J Merrell, MD, 1,000 mg at 07/21/15 1429 .  alum & mag hydroxide-simeth (MAALOX/MYLANTA) 200-200-20 MG/5ML suspension 15 mL, 15 mL, Oral, Q6H PRN, Richarda OverlieNayana Abrol, MD .  amLODipine (NORVASC) tablet 10 mg, 10 mg, Oral, Daily, Russella DarAllison L Ellis, NP, 10 mg at 07/22/15 0927 .  aspirin EC tablet 81 mg, 81 mg, Oral, Daily, Russella DarAllison L Ellis, NP, 81 mg at 07/22/15 [id number] .  baclofen (LIORESAL) tablet 20 mg, 20 mg, Oral, TID, Penny Arrambide Daniel NonesNarayan Kaveer Retha Bither, MD, 20 mg at 07/22/15 2113 .  clonazePAM (KLONOPIN) tablet 0.5 mg, 0.5 mg, Oral, BID, Jann Milkovich Daniel NonesNarayan Kaveer Jaser Fullen, MD, 0.5 mg at 07/22/15 2113 .  enoxaparin (LOVENOX) injection 40 mg, 40 mg, Subcutaneous, Q24H, Russella DarAllison L Ellis, NP, 40 mg at 07/22/15 1740 .  fentaNYL (SUBLIMAZE) injection, , Intravenous, PRN, Julieanne CottonSanjeev Deveshwar, MD, 25 mcg at 07/20/15 1113 .  hydrALAZINE (APRESOLINE) injection 10 mg, 10 mg, Intravenous, Q4H PRN, Russella DarAllison L Ellis, NP .  hydrALAZINE (APRESOLINE) tablet 100 mg, 100 mg, Oral, Q8H, Richarda OverlieNayana Abrol, MD, 100 mg at 07/22/15 2113 .  HYDROmorphone (DILAUDID) injection 1 mg, 1 mg, Intravenous, Q4H PRN, Richarda OverlieNayana Abrol, MD, 1 mg at 07/22/15 1052 .  losartan (COZAAR) tablet 100 mg, 100 mg, Oral, Daily, Russella DarAllison L Ellis, NP, 100 mg at 07/22/15 [id number] .  methylPREDNISolone sodium succinate (SOLU-MEDROL) 125 mg/2 mL injection 125 mg, 125 mg, Intravenous, Q6H, Richarda OverlieNayana Abrol, MD,  125 mg at 07/22/15 1821 .  ondansetron (ZOFRAN) injection 4 mg, 4 mg, Intravenous, Q6H PRN, Richarda OverlieNayana Abrol, MD, 4 mg at 07/20/15 1950 .  oxyCODONE (Oxy IR/ROXICODONE) immediate release tablet 5 mg, 5 mg, Oral, Q6H PRN, Ozella Rocksavid J Merrell, MD, 5 mg at 07/21/15 1836 .  [START ON 07/23/2015] pantoprazole (PROTONIX) EC tablet 40 mg, 40 mg, Oral, Daily, Bertram MillardMichael A Maccia, RPH .  simvastatin (ZOCOR) tablet 20 mg, 20 mg, Oral, QPM, Russella DarAllison L Ellis, NP, 20 mg at 07/22/15 1740 .  valproate (DEPACON) 300 mg in dextrose 5 % 50 mL IVPB, 300 mg, Intravenous, Q6H, Chantal Worthey Daniel NonesNarayan Kaveer Ariyanna Oien, MD, 300 mg at 07/22/15 1738  Procedure description:  Patient was placed in the right lateral decubitus position.  Area was cleaned with betadine and anesthetized with lidocaine.  Using standard bony landmarks with sterile technique, 20G LP needle was placed at approximately L3-4 interspinous level. Procedure was unsuccessful and no CSF was obtained.     No complications were noted.    Will place a request for the procedure to be completed under fluoroscopic guidance by radiology.

## 2015-07-22 NOTE — Progress Notes (Signed)
[facility] PROGRESS NOTE  Shyloh Krinke VHQ:[id number] DOB: Aug 07, 1950 DOA: 07/19/2015   PCP: Katy Apo, MD     Assessment/Plan: Principal Problem:   Severe headache Active Problems:   HTN (hypertension)   Hyperlipidemia   Abnormal computed tomography angiography of brain   CKD (chronic kidney disease), stage III   Acute hyperglycemia   Basilar artery stenosis   Hypertensive emergency   Occlusion and stenosis of basilar artery    Brief summary 65 y.o. male with a Past Medical History of HTN, OA, TIA, HLD who presents with HA and R sided weakness and abnormal basilar artery circulation. IR to perform arteriogram and possible intervention. Neuro and IR following   Assessment and plan Severe headache/ Abnormal computed tomography angiography of brain -Patient presented with severe headache transient right sided weakness (primarily hand) and elevated blood pressure and reported compliance with medications-imaging reveals basilar circulatory abnormalities-CT head without acute stroke and NIHSS was 0 Neurology consultation obtained, interventional radiology also consulted -IR recommended cerebral arteriogram with possibility of pursuing intracranial vascular intervention if indicated (concern over possible aneurysm), 4/21, however unable to be completed due to intractable headache Headache management per neurology, IV Depacon to every 6 hours, IV Solu-Medrol 125 g every 6 hours, baclofen 10 mg 3 times a day , start tapering steroids, duration of Depacon/Solu-Medrol? Patient also received Reglan 2 doses MRI, MRV for severe headache was normal no intracranial abnormality, stable MRI since 2016 IR plans to do angiogram as outpatient Patient continues to have intractable headache, does he need LP?, Will discuss with neurology    HTN (hypertension), improved Continue Norvasc, hydralazine, Cozaar, -Hydralazine prn 2-D echo results LV EF: 65% - 70%   CKD  (chronic kidney disease), stage III -Likely related to hypertension -Obtain urinalysis to check for proteinuria   Hyperlipidemia -Continue preadmission Zocor   Acute hyperglycemia Hemoglobin A1c 6.1  Chest pain , noncardiac  Like esophageal spasma, EKG unchanged  Cardiac enzymes negative D-dimer negative   DVT prophylaxsis Lovenox  Code Status:  Full code    Code Status Orders       Family Communication: Discussed in detail with the patient, all imaging results, lab results explained to the patient   Disposition Plan:  Anticipate discharge in 2-3 days, Patient refusing to go home, states that nobody is going to take care of him at home      Consultants:  Neurology  Interventional radiology  *  Procedures:  None  Antibiotics: Anti-infectives    None         HPI/Subjective: Still complaining of severe headache , Somewhat improved with Reglan  Objective: Filed Vitals:   07/21/15 1655 07/21/15 2125 07/22/15 0115 07/22/15 0500  BP: 143/68 139/64 131/70 170/83  Pulse: 82 72 83 89  Temp: 98.9 F (37.2 C) 98.4 F (36.9 C) 98.8 F (37.1 C) 97.7 F (36.5 C)  TempSrc: Oral Oral Oral Oral  Resp: Weight:      SpO2: 97% 99% 96% 97%    Intake/Output Summary (Last 24 hours) at 07/22/15 0912 Last data filed at 07/22/15 4132  Gross per 24 hour  Intake      0 ml  Output    980 ml  Net   -980 ml    Exam:  Examination:  General exam: Appears calm and comfortable  Respiratory system: Clear to auscultation. Respiratory effort normal. Cardiovascular system: S1 & S2 heard, RRR. No JVD, murmurs, rubs, gallops or clicks.  No pedal edema. Gastrointestinal system: Abdomen is nondistended, soft and nontender. No organomegaly or masses felt. Normal bowel sounds heard. Central nervous system: Alert and oriented. No focal neurological deficits. Extremities: Symmetric 5 x 5 power. Skin: No rashes, lesions or ulcers Psychiatry: Judgement and  insight appear normal. Mood & affect appropriate.     Data Reviewed: I have personally reviewed following labs and imaging studies  Micro Results No results found for this or any previous visit (from the past 240 hour(s)).  Radiology Reports Ct Angio Head W/cm &/or Wo Cm  07/19/2015  CLINICAL DATA:  65 year old male code stroke. Severe headache and weakness. Right side weakness. Initial encounter. EXAM: CT ANGIOGRAPHY HEAD AND NECK TECHNIQUE: Multidetector CT imaging of the head and neck was performed using the standard protocol during bolus administration of intravenous contrast. Multiplanar CT image reconstructions and MIPs were obtained to evaluate the vascular anatomy. Carotid stenosis measurements (when applicable) are obtained utilizing NASCET criteria, using the distal internal carotid diameter as the denominator. CONTRAST:  80 mL Isovue 370. COMPARISON:  Head CT without contrast 1123 hours today. Brain MRI 01/14/2015. FINDINGS: CTA NECK Skeleton: Intermittent poor dentition. Degenerative changes in the cervical and visualized thoracic spine. No acute osseous abnormality identified. Mild to moderate paranasal sinus mucosal thickening with chronic periosteal thickening suggesting chronic sinus disease. Other neck: Mediastinal lipomatosis. No superior mediastinal lymphadenopathy. Negative visualized lung apices aside from atelectasis. Negative thyroid, larynx, pharynx, parapharyngeal spaces, retropharyngeal space, sublingual space, submandibular glands and parotid glands. No cervical lymphadenopathy. Aortic arch: Bovine arch configuration. Minimal to mild calcified arch atherosclerosis. No great vessel origin stenosis. Right carotid system: Mildly tortuous right CCA. Bulky soft and to a lesser extent calcified plaque at the right carotid bifurcation resulting in right ICA origin stenosis of 65 % with respect to the distal vessel. Otherwise negative cervical right ICA. Left carotid system: Tortuous  proximal left CCA. Bovine type left CCA origin. At the left carotid bifurcation there is circumferential soft plaque at the left ICA origin, but less than 50 % stenosis with respect to the distal vessel results (series 9, image 119). Otherwise negative cervical left ICA. Vertebral arteries: No proximal right subclavian artery stenosis. Soft plaque with mild stenosis at the right vertebral artery origin (series 9, image 141). Dominant right vertebral artery otherwise is widely patent to the skullbase. No proximal left subclavian artery stenosis. Non dominant left vertebral artery with a normal origin. Tortuous left V1 segment. No left vertebral artery stenosis to the skullbase. CTA HEAD Posterior circulation: Dominant right vertebral artery with V4 segment calcified plaque resulting an mild to moderate stenosis. Right PICA origin is patent. Non dominant left vertebral artery terminates in the left PICA. Calcified plaque at the basilar artery origin not resulting in stenosis, but there is widespread basilar artery irregularity related to soft atherosclerotic plaque such that a beaded appearance occurs throughout the basilar artery (series 12 images 27 and 28). No high-grade stenosis occurs. The basilar tip remains patent. SCA and MCA origins are patent, but there is moderate to severe stenosis at the left PCA origin. There is additional moderate tandem left P2 segment stenosis. Bilateral distal PCA branches remain patent. Anterior circulation: Soft plaque in the left ICA at the skullbase resulting in up to 50 % stenosis with respect to the distal vessel. Left ICA siphon is patent with mild cavernous and moderate supraclinoid segment calcified plaque not resulting in stenosis. Patent left ICA terminus. Normal left ophthalmic artery origin. Mild cavernous and moderate supra clinoid  right ICA calcified plaque without stenosis. Normal right ophthalmic artery origin. Patent right ICA terminus. Right MCA and ACA origins are  within normal limits. Fenestrated anterior communicating artery is present. Bilateral A2 and distal ACA branches are within normal limits. Right MCA M1 segment, bifurcation, and right MCA branches are within normal limits. Left ACA origin is highly stenotic such that there is minimal if any flow in the left A1 segment. Still, the left A2 is adequately supplied by the anterior communicating artery. Left MCA origin, M1 segment, bifurcation, and left MCA branches are within normal limits. Venous sinuses: Patent. Anatomic variants: Dominant right vertebral artery. Left vertebral artery terminates in PICA. Bovine type arch configuration. IMPRESSION: 1. Negative for emergent large vessel occlusion. 2. Bulky soft plaque at the right carotid bifurcation resulting in 65% stenosis of the right ICA origin. 3. Severe intracranial basilar artery atherosclerosis, resulting in a beaded appearance of the vessel throughout its course, but no hemodynamically significant basilar artery stenosis. There is superimposed mild to moderate stenosis of the right vertebral artery V4 segment which supplies the basilar. The non dominant left vertebral artery terminates in the left PICA. 4. Severe stenosis at the left PCA origin with preserved distal flow. 5. Severe stenosis at the left ACA origin with adequately reconstituted A2 segment flow via the anterior communicating artery. 6. Other intracranial and extracranial atherosclerosis is not hemodynamically significant. Electronically Signed   By: Odessa Fleming M.D.   On: 07/19/2015 12:51   Dg Chest 2 View  07/20/2015  CLINICAL DATA:  Dry cough and severe headache yesterday, history hypertension EXAM: CHEST  2 VIEW COMPARISON:  None FINDINGS: Lordotic positioning with slight rotation to the RIGHT on AP view. Normal heart size, mediastinal contours and pulmonary vascularity. Elevation of RIGHT diaphragm with RIGHT basilar atelectasis. Lungs otherwise clear. No pleural effusion or pneumothorax.  IMPRESSION: RIGHT basilar atelectasis. Electronically Signed   By: Ulyses Southward M.D.   On: 07/20/2015 11:16   Ct Angio Neck W/cm &/or Wo/cm  07/19/2015  CLINICAL DATA:  65 year old male code stroke. Severe headache and weakness. Right side weakness. Initial encounter. EXAM: CT ANGIOGRAPHY HEAD AND NECK TECHNIQUE: Multidetector CT imaging of the head and neck was performed using the standard protocol during bolus administration of intravenous contrast. Multiplanar CT image reconstructions and MIPs were obtained to evaluate the vascular anatomy. Carotid stenosis measurements (when applicable) are obtained utilizing NASCET criteria, using the distal internal carotid diameter as the denominator. CONTRAST:  80 mL Isovue 370. COMPARISON:  Head CT without contrast 1123 hours today. Brain MRI 01/14/2015. FINDINGS: CTA NECK Skeleton: Intermittent poor dentition. Degenerative changes in the cervical and visualized thoracic spine. No acute osseous abnormality identified. Mild to moderate paranasal sinus mucosal thickening with chronic periosteal thickening suggesting chronic sinus disease. Other neck: Mediastinal lipomatosis. No superior mediastinal lymphadenopathy. Negative visualized lung apices aside from atelectasis. Negative thyroid, larynx, pharynx, parapharyngeal spaces, retropharyngeal space, sublingual space, submandibular glands and parotid glands. No cervical lymphadenopathy. Aortic arch: Bovine arch configuration. Minimal to mild calcified arch atherosclerosis. No great vessel origin stenosis. Right carotid system: Mildly tortuous right CCA. Bulky soft and to a lesser extent calcified plaque at the right carotid bifurcation resulting in right ICA origin stenosis of 65 % with respect to the distal vessel. Otherwise negative cervical right ICA. Left carotid system: Tortuous proximal left CCA. Bovine type left CCA origin. At the left carotid bifurcation there is circumferential soft plaque at the left ICA origin,  but less than 50 % stenosis with  respect to the distal vessel results (series 9, image 119). Otherwise negative cervical left ICA. Vertebral arteries: No proximal right subclavian artery stenosis. Soft plaque with mild stenosis at the right vertebral artery origin (series 9, image 141). Dominant right vertebral artery otherwise is widely patent to the skullbase. No proximal left subclavian artery stenosis. Non dominant left vertebral artery with a normal origin. Tortuous left V1 segment. No left vertebral artery stenosis to the skullbase. CTA HEAD Posterior circulation: Dominant right vertebral artery with V4 segment calcified plaque resulting an mild to moderate stenosis. Right PICA origin is patent. Non dominant left vertebral artery terminates in the left PICA. Calcified plaque at the basilar artery origin not resulting in stenosis, but there is widespread basilar artery irregularity related to soft atherosclerotic plaque such that a beaded appearance occurs throughout the basilar artery (series 12 images 27 and 28). No high-grade stenosis occurs. The basilar tip remains patent. SCA and MCA origins are patent, but there is moderate to severe stenosis at the left PCA origin. There is additional moderate tandem left P2 segment stenosis. Bilateral distal PCA branches remain patent. Anterior circulation: Soft plaque in the left ICA at the skullbase resulting in up to 50 % stenosis with respect to the distal vessel. Left ICA siphon is patent with mild cavernous and moderate supraclinoid segment calcified plaque not resulting in stenosis. Patent left ICA terminus. Normal left ophthalmic artery origin. Mild cavernous and moderate supra clinoid right ICA calcified plaque without stenosis. Normal right ophthalmic artery origin. Patent right ICA terminus. Right MCA and ACA origins are within normal limits. Fenestrated anterior communicating artery is present. Bilateral A2 and distal ACA branches are within normal limits.  Right MCA M1 segment, bifurcation, and right MCA branches are within normal limits. Left ACA origin is highly stenotic such that there is minimal if any flow in the left A1 segment. Still, the left A2 is adequately supplied by the anterior communicating artery. Left MCA origin, M1 segment, bifurcation, and left MCA branches are within normal limits. Venous sinuses: Patent. Anatomic variants: Dominant right vertebral artery. Left vertebral artery terminates in PICA. Bovine type arch configuration. IMPRESSION: 1. Negative for emergent large vessel occlusion. 2. Bulky soft plaque at the right carotid bifurcation resulting in 65% stenosis of the right ICA origin. 3. Severe intracranial basilar artery atherosclerosis, resulting in a beaded appearance of the vessel throughout its course, but no hemodynamically significant basilar artery stenosis. There is superimposed mild to moderate stenosis of the right vertebral artery V4 segment which supplies the basilar. The non dominant left vertebral artery terminates in the left PICA. 4. Severe stenosis at the left PCA origin with preserved distal flow. 5. Severe stenosis at the left ACA origin with adequately reconstituted A2 segment flow via the anterior communicating artery. 6. Other intracranial and extracranial atherosclerosis is not hemodynamically significant. Electronically Signed   By: Odessa Fleming M.D.   On: 07/19/2015 12:51   Mr Laqueta Jean ZO Contrast  07/21/2015  CLINICAL DATA:  65 year old male with headache and right side weakness. Initial encounter. EXAM: MRI HEAD WITHOUT AND WITH CONTRAST MRV HEAD WITHOUT CONTRAST TECHNIQUE: Multiplanar, multiecho pulse sequences of the brain and surrounding structures were obtained without and with intravenous contrast. Angiographic images of the head were obtained using MRV technique without contrast. CONTRAST:  20 mL MultiHance COMPARISON:  CTA head and neck 07/19/2015.  Brain MRI 01/14/2015 FINDINGS: MRI HEAD FINDINGS Major  intracranial vascular flow voids are stable, MRI images suggest that the non dominant left  vertebral artery rather than anatomically terminating in the PICA is chronically occluded beyond the left PICA. No restricted diffusion or evidence of acute infarction. No midline shift, mass effect, evidence of mass lesion, ventriculomegaly, extra-axial collection or acute intracranial hemorrhage. Cervicomedullary junction and pituitary are within normal limits. Stable mild for age nonspecific cerebral white matter T2 and FLAIR hyperintensity, slightly greater in the left hemisphere. No cortical encephalomalacia or chronic cerebral blood products. Deep gray matter nuclei, brainstem, and cerebellum are normal for age. No abnormal enhancement identified. No dural thickening. Visible internal auditory structures appear normal. Mastoids are clear. Mild paranasal sinus mucosal thickening. Negative orbit and scalp soft tissues. Negative visualized cervical spine. Normal bone marrow signal. MRV HEAD FINDINGS Preserved flow signal in the superior sagittal sinus, the torcula, both transverse sinuses (the left is dominant) both sigmoid sinuses, and both IJ bulbs. Preserved flow signal in the straight sinus, vein of Galen, internal cerebral veins, and basal veins of Rosenthal. Flow signal in the major cortical veins appears preserved including the [place]rol[place] and veins of Labbe. IMPRESSION: 1. Normal intracranial MRV. 2. No acute intracranial abnormality. Stable MRI appearance of the brain since 2016. 3. 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 Electronically Signed   By: Odessa Fleming M.D.   On: 07/21/2015 12:02   Mr Mrv Head Wo Cm  07/21/2015  CLINICAL DATA:  65 year old male with headache and right side weakness. Initial encounter. EXAM: MRI HEAD WITHOUT AND WITH CONTRAST MRV HEAD WITHOUT CONTRAST TECHNIQUE: Multiplanar,  multiecho pulse sequences of the brain and surrounding structures were obtained without and with intravenous contrast. Angiographic images of the head were obtained using MRV technique without contrast. CONTRAST:  20 mL MultiHance COMPARISON:  CTA head and neck 07/19/2015.  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 No restricted diffusion or evidence of acute infarction. No midline shift, mass effect, evidence of mass lesion, ventriculomegaly, extra-axial collection or acute intracranial hemorrhage. Cervicomedullary junction and pituitary are within normal limits. Stable mild for age nonspecific cerebral white matter T2 and FLAIR hyperintensity, slightly greater in the left hemisphere. No cortical encephalomalacia or chronic cerebral blood products. Deep gray matter nuclei, brainstem, and cerebellum are normal for age. No abnormal enhancement identified. No dural thickening. Visible internal auditory structures appear normal. Mastoids are clear. Mild paranasal sinus mucosal thickening. Negative orbit and scalp soft tissues. Negative visualized cervical spine. Normal bone marrow signal. MRV HEAD FINDINGS Preserved flow signal in the superior sagittal sinus, the torcula, both transverse sinuses (the left is dominant) both sigmoid sinuses, and both IJ bulbs. Preserved flow signal in the straight sinus, vein of Galen, internal cerebral veins, and basal veins of Rosenthal. Flow signal in the major cortical veins appears preserved including the [place]rol[place] and veins of Labbe. IMPRESSION: 1. Normal intracranial MRV. 2. No acute intracranial abnormality. Stable MRI appearance of the brain since 2016. 3. MRI images demonstrate that the left vertebral artery rather than anatomically terminating in the PICA, rather is  chronically occluded beyond the left PICA. Electronically Signed   By: Odessa Fleming M.D.   On: 07/21/2015 12:02   Ct Head Code Stroke W/o Cm  07/19/2015  CLINICAL DATA:  Severe headache and right-sided weakness today. EXAM: CT HEAD WITHOUT CONTRAST  TECHNIQUE: Contiguous axial images were obtained from the base of the skull through the vertex without intravenous contrast. COMPARISON:  Head CT 01/14/2015 and MRI brain 01/14/2015 FINDINGS: Stable age related cerebral atrophy, ventriculomegaly and periventricular white matter disease. No findings for acute hemispheric infarction an or intracranial hemorrhage. No extra-axial fluid collections are identified. No mass lesions. The brainstem and cerebellum are grossly normal in stable. Vascular calcifications are noted. No acute bony findings. Extensive mucoperiosteal thickening involving the maxillary and ethmoid sinuses bilaterally. The mastoid air cells and middle ear cavities are clear. The globes are intact. IMPRESSION: No acute intracranial findings. Specifically, no findings for hemispheric infarction or intracranial hemorrhage. Stable age related cerebral atrophy and mild ventriculomegaly. Electronically Signed   By: Rudie Meyer M.D.   On: 07/19/2015 11:29     CBC  Recent Labs Lab 07/19/15 1114 07/19/15 1122 07/20/15 0605 07/21/15 0209 07/22/15 0520  WBC 5.9  --  11.5* 15.4* 15.5*  HGB 13.7 16.0 14.2 13.9 14.7  HCT 42.2 47.0 43.4 43.5 46.7  PLT 177  --  218 227 246  MCV 85.9  --  85.4 88.4 87.8  MCH 27.9  --  28.0 28.3 27.6  MCHC 32.5  --  32.7 32.0 31.5  RDW 13.6  --  13.5 13.9 14.0  LYMPHSABS 2.1  --   --   --   --   MONOABS 0.4  --   --   --   --   EOSABS 0.3  --   --   --   --   BASOSABS 0.0  --   --   --   --     Chemistries   Recent Labs Lab 07/19/15 1114 07/19/15 1122 07/20/15 0605 07/21/15 0209 07/22/15 0520  NA 140 140 136 138 137  K 4.5 4.4 4.4 4.8 4.6  CL 105 104 101 103 101  CO2 22  --  GLUCOSE 157* 153*  147* 133* 138*  BUN 24* 37*  CREATININE 1.44* 1.40* 1.27* 1.33* 1.53*  CALCIUM 9.2  --  9.2 9.1 9.2  AST 26  --   --  17 24  ALT 21  --   --  17 18  ALKPHOS 59  --   --  54 52  BILITOT 0.9  --   --  0.5 0.8   ------------------------------------------------------------------------------------------------------------------ CrCl cannot be calculated (Unknown ideal weight.). ------------------------------------------------------------------------------------------------------------------  Recent Labs  07/20/15 0605  HGBA1C 6.1*   ------------------------------------------------------------------------------------------------------------------  Recent Labs  07/20/15 0605  CHOL 200  HDL 55  LDLCALC 122*  TRIG 116  CHOLHDL 3.6   ------------------------------------------------------------------------------------------------------------------ No results for input(s): TSH, T4TOTAL, T3FREE, THYROIDAB in the last 72 hours.  Invalid input(s): FREET3 ------------------------------------------------------------------------------------------------------------------ No results for input(s): VITAMINB12, FOLATE, FERRITIN, TIBC, IRON, RETICCTPCT in the last 72 hours.  Coagulation profile  Recent Labs Lab 07/19/15 1114  INR 1.10     Recent Labs  07/20/15 1211  DDIMER <0.27    Cardiac Enzymes  Recent Labs Lab 07/20/15 1211  TROPONINI <0.03   ------------------------------------------------------------------------------------------------------------------ Invalid input(s): POCBNP   CBG:  Recent Labs Lab 07/19/15 1137 07/21/15 2128 07/22/15 0612  GLUCAP 131* 115* 130*       Studies: Dg Chest 2 View  07/20/2015  CLINICAL DATA:  Dry cough and severe headache yesterday, history hypertension EXAM: CHEST  2 VIEW COMPARISON:  None FINDINGS: Lordotic positioning with slight rotation to the RIGHT on AP view. Normal heart size, mediastinal contours and pulmonary  vascularity. Elevation of RIGHT diaphragm with RIGHT basilar atelectasis. Lungs otherwise clear. No pleural effusion or pneumothorax. IMPRESSION: RIGHT basilar atelectasis. Electronically Signed   By: Ulyses Southward M.D.   On: 07/20/2015 11:16   Mr Laqueta Jean QM Contrast  07/21/2015  CLINICAL DATA:  65 year old male with headache and right side weakness. Initial encounter. EXAM: MRI HEAD WITHOUT AND WITH CONTRAST MRV HEAD WITHOUT CONTRAST TECHNIQUE: Multiplanar, multiecho pulse sequences of the brain and surrounding structures were obtained without and with intravenous contrast. Angiographic images of the head were obtained using MRV technique without contrast. CONTRAST:  20 mL MultiHance COMPARISON:  CTA head and neck 07/19/2015.  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 No restricted diffusion or evidence of acute infarction. No midline shift, mass effect, evidence of mass lesion, ventriculomegaly, extra-axial collection or acute intracranial hemorrhage. Cervicomedullary junction and pituitary are within normal limits. Stable mild for age nonspecific cerebral white matter T2 and FLAIR hyperintensity, slightly greater in the left hemisphere. No cortical encephalomalacia or chronic cerebral blood products. Deep gray matter nuclei, brainstem, and cerebellum are normal for age. No abnormal enhancement identified. No dural thickening. Visible internal auditory structures appear normal. Mastoids are clear. Mild paranasal sinus mucosal thickening. Negative orbit and scalp soft tissues. Negative visualized cervical spine. Normal bone marrow signal. MRV HEAD FINDINGS Preserved flow signal in the superior sagittal sinus, the torcula, both transverse sinuses (the left is dominant) both sigmoid sinuses, and both IJ  bulbs. Preserved flow signal in the straight sinus, vein of Galen, internal cerebral veins, and basal veins of Rosenthal. Flow signal in the major cortical veins appears preserved including the Trolard and veins of Labbe. IMPRESSION: 1. Normal intracranial MRV. 2. No acute intracranial abnormality. Stable MRI appearance of the brain since 2016. 3. 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 Electronically Signed   By: Odessa Fleming M.D.   On: 07/21/2015 12:02   Mr Mrv Head Wo Cm  07/21/2015  CLINICAL DATA:  65 year old male with headache and right side weakness. Initial encounter. EXAM: MRI HEAD WITHOUT AND WITH CONTRAST MRV HEAD WITHOUT CONTRAST TECHNIQUE: Multiplanar, multiecho pulse sequences of the brain and surrounding structures were obtained without and with intravenous contrast. Angiographic images of the head were obtained using MRV technique without contrast. CONTRAST:  20 mL MultiHance COMPARISON:  CTA head and neck 07/19/2015.  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 No restricted diffusion or evidence of acute infarction. No midline shift, mass effect, evidence of mass lesion, ventriculomegaly, extra-axial collection or acute intracranial hemorrhage. Cervicomedullary junction and pituitary are within normal limits. Stable mild for age nonspecific cerebral white matter T2 and FLAIR hyperintensity, slightly greater in the left hemisphere. No cortical encephalomalacia or chronic cerebral blood products. Deep gray matter nuclei, brainstem, and cerebellum are normal for age. No abnormal enhancement identified. No dural thickening. Visible internal auditory structures appear normal.  Mastoids are clear. Mild paranasal sinus mucosal thickening. Negative orbit and scalp soft tissues. Negative visualized cervical spine. Normal bone marrow signal. MRV HEAD FINDINGS Preserved flow signal in the  superior sagittal sinus, the torcula, both transverse sinuses (the left is dominant) both sigmoid sinuses, and both IJ bulbs. Preserved flow signal in the straight sinus, vein of Galen, internal cerebral veins, and basal veins of Rosenthal. Flow signal in the major cortical veins appears preserved including the Trolard and veins of Labbe. IMPRESSION: 1. Normal intracranial MRV. 2. No acute intracranial abnormality. Stable MRI appearance of the brain since 2016. 3. 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 Electronically Signed   By: Odessa Fleming M.D.   On: 07/21/2015 12:02      Lab Results  Component Value Date   HGBA1C 6.1* 07/20/2015   HGBA1C 5.9* 01/15/2015   Lab Results  Component Value Date   LDLCALC 122* 07/20/2015   CREATININE 1.53* 07/22/2015       Scheduled Meds: . amLODipine  10 mg Oral Daily  . aspirin EC  81 mg Oral Daily  . baclofen  20 mg Oral TID  . clonazePAM  0.5 mg Oral BID  . enoxaparin (LOVENOX) injection  40 mg Subcutaneous Q24H  . hydrALAZINE  100 mg Oral Q8H  . losartan  100 mg Oral Daily  . methylPREDNISolone (SOLU-MEDROL) injection  250 mg Intravenous Q6H  . metoCLOPramide (REGLAN) injection  10 mg Intravenous Once  . pantoprazole (PROTONIX) IV  40 mg Intravenous Q24H  . simvastatin  20 mg Oral QPM  . valproate sodium  300 mg Intravenous Q6H   Continuous Infusions:    LOS: 2 days     Time spent: >30 MINS    [facility]  [facility] Pager [phone]. If 7PM-7AM, please contact night-coverage at [email or web address], password [facility] 07/22/2015, 9:12 AM  LOS: 2 days

## 2015-07-22 NOTE — Progress Notes (Signed)
Interval History:                                                                                                                      Alexander Gonzalez is an 65 y.o. male patient with intractible severe headache despite use of several medications, including baclofen 20 mg TDI, Depacon 300 mg Q6H, iv solumedrol, clonazepam 0.5 mg BID, and PRN opioids. He reports no significant change in HA, and is in distress from it . He gets intermittent relief for several hours with the medications, but the HA apparently recurs with same intensity of 10/10. He describes this HA being located in frontal, bitemporal and occipital area like a band, commonly described in a location with chronic tension type headaches.    Past Medical History: Past Medical History  Diagnosis Date  . Hypertension   . Osteoarthritis   . Mini stroke (HCC) 1996  . Hyperlipidemia     Past Surgical History  Procedure Laterality Date  . Knee surgery  1976    right  . Replacement total knee  1996    left  . Knee surgery  1980'2    right    Family History: Family History  Problem Relation Age of Onset  . Cancer Mother   . Hypertension Mother   . Heart attack Father     Social History:   reports that he has never smoked. He does not have any smokeless tobacco history on file. He reports that he drinks alcohol. He reports that he does not use illicit drugs.  Allergies:  No Known Allergies   Medications:                                                                                                                         Current facility-administered medications:  .  acetaminophen (TYLENOL) tablet 1,000 mg, 1,000 mg, Oral, Q6H PRN, Ozella Rocksavid J Merrell, MD, 1,000 mg at 07/21/15 1429 .  alum & mag hydroxide-simeth (MAALOX/MYLANTA) 200-200-20 MG/5ML suspension 15 mL, 15 mL, Oral, Q6H PRN, Richarda OverlieNayana Abrol, MD .  amLODipine (NORVASC) tablet 10 mg, 10 mg, Oral, Daily, Russella DarAllison L Ellis, NP, 10 mg at 07/22/15 0927 .  aspirin EC tablet  81 mg, 81 mg, Oral, Daily, Russella DarAllison L Ellis, NP, 81 mg at 07/22/15 [id number] .  baclofen (LIORESAL) tablet 20 mg, 20 mg, Oral, TID, Divit Stipp Daniel NonesNarayan Kaveer Perl Kerney, MD, 20 mg at 07/22/15 1557 .  clonazePAM (KLONOPIN) tablet 0.5 mg, 0.5 mg, Oral, BID, Lorisa Scheid Hildred AlaminNarayan  Arie Sabina, MD, 0.5 mg at 07/22/15 3295 .  enoxaparin (LOVENOX) injection 40 mg, 40 mg, Subcutaneous, Q24H, Russella Dar, NP, 40 mg at 07/22/15 1740 .  fentaNYL (SUBLIMAZE) injection, , Intravenous, PRN, Julieanne Cotton, MD, 25 mcg at 07/20/15 1113 .  hydrALAZINE (APRESOLINE) injection 10 mg, 10 mg, Intravenous, Q4H PRN, Russella Dar, NP .  hydrALAZINE (APRESOLINE) tablet 100 mg, 100 mg, Oral, Q8H, Richarda Overlie, MD, 100 mg at 07/22/15 1323 .  HYDROmorphone (DILAUDID) injection 1 mg, 1 mg, Intravenous, Q4H PRN, Richarda Overlie, MD, 1 mg at 07/22/15 1052 .  losartan (COZAAR) tablet 100 mg, 100 mg, Oral, Daily, Russella Dar, NP, 100 mg at 07/22/15 1884 .  methylPREDNISolone sodium succinate (SOLU-MEDROL) 125 mg/2 mL injection 125 mg, 125 mg, Intravenous, Q6H, Richarda Overlie, MD, 125 mg at 07/22/15 1821 .  ondansetron (ZOFRAN) injection 4 mg, 4 mg, Intravenous, Q6H PRN, Richarda Overlie, MD, 4 mg at 07/20/15 1950 .  oxyCODONE (Oxy IR/ROXICODONE) immediate release tablet 5 mg, 5 mg, Oral, Q6H PRN, Ozella Rocks, MD, 5 mg at 07/21/15 1836 .  [START ON 07/23/2015] pantoprazole (PROTONIX) EC tablet 40 mg, 40 mg, Oral, Daily, Bertram Millard, RPH .  simvastatin (ZOCOR) tablet 20 mg, 20 mg, Oral, QPM, Russella Dar, NP, 20 mg at 07/22/15 1740 .  valproate (DEPACON) 300 mg in dextrose 5 % 50 mL IVPB, 300 mg, Intravenous, Q6H, Napolean Sia Daniel Nones, MD, 300 mg at 07/22/15 1738   Neurologic Examination:                                                                                                     Today's Vitals   07/22/15 1043 07/22/15 1116 07/22/15 1445 07/22/15 1736  BP:   153/74 147/77  Pulse:   82 96  Temp:   98 F (36.7 C) 98.7 F  (37.1 C)  TempSrc:   Oral Oral  Resp:   20 20  Weight:      SpO2:   97% 97%  PainSc: 10-Worst pain ever Asleep      Evaluation of higher integrative functions including: Level of alertness: Alert, in distress from HA Oriented to time, place and person Speech: fluent, no evidence of dysarthria or aphasia noted.  Test the following cranial nerves: 2-12 grossly intact Motor examination: Normal tone, bulk, full 5/5 motor strength in all 4 extremities Examination of sensation : grossly symmetric sensation in all 4 extremities and on face Test coordination: Normal finger nose testing, with no evidence of limb appendicular ataxia or abnormal involuntary movements or tremors noted.    Lab Results: Basic Metabolic Panel:  Recent Labs Lab 07/19/15 1114 07/19/15 1122 07/20/15 0605 07/21/15 0209 07/22/15 0520  NA 140 140 136 138 137  K 4.5 4.4 4.4 4.8 4.6  CL 105 104 101 103 101  CO2 22  --  23 24 22   GLUCOSE 157* 153* 147* 133* 138*  BUN 14 18 20  24* 37*  CREATININE 1.44* 1.40* 1.27* 1.33* 1.53*  CALCIUM 9.2  --  9.2 9.1 9.2    Liver  Function Tests:  Recent Labs Lab 07/19/15 1114 07/21/15 0209 07/22/15 0520  AST ALT ALKPHOS 59 54 52  BILITOT 0.9 0.5 0.8  PROT 8.1 7.6 7.9  ALBUMIN 4.1 3.4* 3.4*   No results for input(s): LIPASE, AMYLASE in the last 168 hours. No results for input(s): AMMONIA in the last 168 hours.  CBC:  Recent Labs Lab 07/19/15 1114 07/19/15 1122 07/20/15 0605 07/21/15 0209 07/22/15 0520  WBC 5.9  --  11.5* 15.4* 15.5*  NEUTROABS 3.2  --   --   --   --   HGB 13.7 16.0 14.2 13.9 14.7  HCT 42.2 47.0 43.4 43.5 46.7  MCV 85.9  --  85.4 88.4 87.8  PLT 177  --  218 227 246    Cardiac Enzymes:  Recent Labs Lab 07/20/15 1211  TROPONINI <0.03    Lipid Panel:  Recent Labs Lab 07/20/15 0605  CHOL 200  TRIG 116  HDL 55  CHOLHDL 3.6  VLDL 23  LDLCALC 161*    CBG:  Recent Labs Lab 07/19/15 1137 07/21/15 2128  07/22/15 0612 07/22/15 1102 07/22/15 1630  GLUCAP 131* 115* 130* 120* 121*    Microbiology: No results found for this or any previous visit.  Imaging: Ct Angio Head W/cm &/or Wo Cm  07/19/2015  CLINICAL DATA:  65 year old male code stroke. Severe headache and weakness. Right side weakness. Initial encounter. EXAM: CT ANGIOGRAPHY HEAD AND NECK TECHNIQUE: Multidetector CT imaging of the head and neck was performed using the standard protocol during bolus administration of intravenous contrast. Multiplanar CT image reconstructions and MIPs were obtained to evaluate the vascular anatomy. Carotid stenosis measurements (when applicable) are obtained utilizing NASCET criteria, using the distal internal carotid diameter as the denominator. CONTRAST:  80 mL Isovue 370. COMPARISON:  Head CT without contrast 1123 hours today. Brain MRI 01/14/2015. FINDINGS: CTA NECK Skeleton: Intermittent poor dentition. Degenerative changes in the cervical and visualized thoracic spine. No acute osseous abnormality identified. Mild to moderate paranasal sinus mucosal thickening with chronic periosteal thickening suggesting chronic sinus disease. Other neck: Mediastinal lipomatosis. No superior mediastinal lymphadenopathy. Negative visualized lung apices aside from atelectasis. Negative thyroid, larynx, pharynx, parapharyngeal spaces, retropharyngeal space, sublingual space, submandibular glands and parotid glands. No cervical lymphadenopathy. Aortic arch: Bovine arch configuration. Minimal to mild calcified arch atherosclerosis. No great vessel origin stenosis. Right carotid system: Mildly tortuous right CCA. Bulky soft and to a lesser extent calcified plaque at the right carotid bifurcation resulting in right ICA origin stenosis of 65 % with respect to the distal vessel. Otherwise negative cervical right ICA. Left carotid system: Tortuous proximal left CCA. Bovine type left CCA origin. At the left carotid bifurcation there is  circumferential soft plaque at the left ICA origin, but less than 50 % stenosis with respect to the distal vessel results (series 9, image 119). Otherwise negative cervical left ICA. Vertebral arteries: No proximal right subclavian artery stenosis. Soft plaque with mild stenosis at the right vertebral artery origin (series 9, image 141). Dominant right vertebral artery otherwise is widely patent to the skullbase. No proximal left subclavian artery stenosis. Non dominant left vertebral artery with a normal origin. Tortuous left V1 segment. No left vertebral artery stenosis to the skullbase. CTA HEAD Posterior circulation: Dominant right vertebral artery with V4 segment calcified plaque resulting an mild to moderate stenosis. Right PICA origin is patent. Non dominant left vertebral artery terminates in the left PICA. Calcified plaque at the basilar  artery origin not resulting in stenosis, but there is widespread basilar artery irregularity related to soft atherosclerotic plaque such that a beaded appearance occurs throughout the basilar artery (series 12 images 27 and 28). No high-grade stenosis occurs. The basilar tip remains patent. SCA and MCA origins are patent, but there is moderate to severe stenosis at the left PCA origin. There is additional moderate tandem left P2 segment stenosis. Bilateral distal PCA branches remain patent. Anterior circulation: Soft plaque in the left ICA at the skullbase resulting in up to 50 % stenosis with respect to the distal vessel. Left ICA siphon is patent with mild cavernous and moderate supraclinoid segment calcified plaque not resulting in stenosis. Patent left ICA terminus. Normal left ophthalmic artery origin. Mild cavernous and moderate supra clinoid right ICA calcified plaque without stenosis. Normal right ophthalmic artery origin. Patent right ICA terminus. Right MCA and ACA origins are within normal limits. Fenestrated anterior communicating artery is present. Bilateral A2  and distal ACA branches are within normal limits. Right MCA M1 segment, bifurcation, and right MCA branches are within normal limits. Left ACA origin is highly stenotic such that there is minimal if any flow in the left A1 segment. Still, the left A2 is adequately supplied by the anterior communicating artery. Left MCA origin, M1 segment, bifurcation, and left MCA branches are within normal limits. Venous sinuses: Patent. Anatomic variants: Dominant right vertebral artery. Left vertebral artery terminates in PICA. Bovine type arch configuration. IMPRESSION: 1. Negative for emergent large vessel occlusion. 2. Bulky soft plaque at the right carotid bifurcation resulting in 65% stenosis of the right ICA origin. 3. Severe intracranial basilar artery atherosclerosis, resulting in a beaded appearance of the vessel throughout its course, but no hemodynamically significant basilar artery stenosis. There is superimposed mild to moderate stenosis of the right vertebral artery V4 segment which supplies the basilar. The non dominant left vertebral artery terminates in the left PICA. 4. Severe stenosis at the left PCA origin with preserved distal flow. 5. Severe stenosis at the left ACA origin with adequately reconstituted A2 segment flow via the anterior communicating artery. 6. Other intracranial and extracranial atherosclerosis is not hemodynamically significant. Electronically Signed   By: Odessa Fleming M.D.   On: 07/19/2015 12:51   Dg Chest 2 View  07/20/2015  CLINICAL DATA:  Dry cough and severe headache yesterday, history hypertension EXAM: CHEST  2 VIEW COMPARISON:  None FINDINGS: Lordotic positioning with slight rotation to the RIGHT on AP view. Normal heart size, mediastinal contours and pulmonary vascularity. Elevation of RIGHT diaphragm with RIGHT basilar atelectasis. Lungs otherwise clear. No pleural effusion or pneumothorax. IMPRESSION: RIGHT basilar atelectasis. Electronically Signed   By: Ulyses Southward M.D.   On:  07/20/2015 11:16   Ct Angio Neck W/cm &/or Wo/cm  07/19/2015  CLINICAL DATA:  65 year old male code stroke. Severe headache and weakness. Right side weakness. Initial encounter. EXAM: CT ANGIOGRAPHY HEAD AND NECK TECHNIQUE: Multidetector CT imaging of the head and neck was performed using the standard protocol during bolus administration of intravenous contrast. Multiplanar CT image reconstructions and MIPs were obtained to evaluate the vascular anatomy. Carotid stenosis measurements (when applicable) are obtained utilizing NASCET criteria, using the distal internal carotid diameter as the denominator. CONTRAST:  80 mL Isovue 370. COMPARISON:  Head CT without contrast 1123 hours today. Brain MRI 01/14/2015. FINDINGS: CTA NECK Skeleton: Intermittent poor dentition. Degenerative changes in the cervical and visualized thoracic spine. No acute osseous abnormality identified. Mild to moderate paranasal sinus mucosal thickening  with chronic periosteal thickening suggesting chronic sinus disease. Other neck: Mediastinal lipomatosis. No superior mediastinal lymphadenopathy. Negative visualized lung apices aside from atelectasis. Negative thyroid, larynx, pharynx, parapharyngeal spaces, retropharyngeal space, sublingual space, submandibular glands and parotid glands. No cervical lymphadenopathy. Aortic arch: Bovine arch configuration. Minimal to mild calcified arch atherosclerosis. No great vessel origin stenosis. Right carotid system: Mildly tortuous right CCA. Bulky soft and to a lesser extent calcified plaque at the right carotid bifurcation resulting in right ICA origin stenosis of 65 % with respect to the distal vessel. Otherwise negative cervical right ICA. Left carotid system: Tortuous proximal left CCA. Bovine type left CCA origin. At the left carotid bifurcation there is circumferential soft plaque at the left ICA origin, but less than 50 % stenosis with respect to the distal vessel results (series 9, image 119).  Otherwise negative cervical left ICA. Vertebral arteries: No proximal right subclavian artery stenosis. Soft plaque with mild stenosis at the right vertebral artery origin (series 9, image 141). Dominant right vertebral artery otherwise is widely patent to the skullbase. No proximal left subclavian artery stenosis. Non dominant left vertebral artery with a normal origin. Tortuous left V1 segment. No left vertebral artery stenosis to the skullbase. CTA HEAD Posterior circulation: Dominant right vertebral artery with V4 segment calcified plaque resulting an mild to moderate stenosis. Right PICA origin is patent. Non dominant left vertebral artery terminates in the left PICA. Calcified plaque at the basilar artery origin not resulting in stenosis, but there is widespread basilar artery irregularity related to soft atherosclerotic plaque such that a beaded appearance occurs throughout the basilar artery (series 12 images 27 and 28). No high-grade stenosis occurs. The basilar tip remains patent. SCA and MCA origins are patent, but there is moderate to severe stenosis at the left PCA origin. There is additional moderate tandem left P2 segment stenosis. Bilateral distal PCA branches remain patent. Anterior circulation: Soft plaque in the left ICA at the skullbase resulting in up to 50 % stenosis with respect to the distal vessel. Left ICA siphon is patent with mild cavernous and moderate supraclinoid segment calcified plaque not resulting in stenosis. Patent left ICA terminus. Normal left ophthalmic artery origin. Mild cavernous and moderate supra clinoid right ICA calcified plaque without stenosis. Normal right ophthalmic artery origin. Patent right ICA terminus. Right MCA and ACA origins are within normal limits. Fenestrated anterior communicating artery is present. Bilateral A2 and distal ACA branches are within normal limits. Right MCA M1 segment, bifurcation, and right MCA branches are within normal limits. Left ACA  origin is highly stenotic such that there is minimal if any flow in the left A1 segment. Still, the left A2 is adequately supplied by the anterior communicating artery. Left MCA origin, M1 segment, bifurcation, and left MCA branches are within normal limits. Venous sinuses: Patent. Anatomic variants: Dominant right vertebral artery. Left vertebral artery terminates in PICA. Bovine type arch configuration. IMPRESSION: 1. Negative for emergent large vessel occlusion. 2. Bulky soft plaque at the right carotid bifurcation resulting in 65% stenosis of the right ICA origin. 3. Severe intracranial basilar artery atherosclerosis, resulting in a beaded appearance of the vessel throughout its course, but no hemodynamically significant basilar artery stenosis. There is superimposed mild to moderate stenosis of the right vertebral artery V4 segment which supplies the basilar. The non dominant left vertebral artery terminates in the left PICA. 4. Severe stenosis at the left PCA origin with preserved distal flow. 5. Severe stenosis at the left ACA origin with adequately  reconstituted A2 segment flow via the anterior communicating artery. 6. Other intracranial and extracranial atherosclerosis is not hemodynamically significant. Electronically Signed   By: Odessa Fleming M.D.   On: 07/19/2015 12:51   Mr Laqueta Jean MV Contrast  07/21/2015  CLINICAL DATA:  65 year old male with headache and right side weakness. Initial encounter. EXAM: MRI HEAD WITHOUT AND WITH CONTRAST MRV HEAD WITHOUT CONTRAST TECHNIQUE: Multiplanar, multiecho pulse sequences of the brain and surrounding structures were obtained without and with intravenous contrast. Angiographic images of the head were obtained using MRV technique without contrast. CONTRAST:  20 mL MultiHance COMPARISON:  CTA head and neck 07/19/2015.  Brain MRI 01/14/2015 FINDINGS: MRI HEAD FINDINGS Major intracranial vascular flow voids are stable, MRI images suggest that the non dominant left  vertebral artery rather than anatomically terminating in the PICA is chronically occluded beyond the left PICA. No restricted diffusion or evidence of acute infarction. No midline shift, mass effect, evidence of mass lesion, ventriculomegaly, extra-axial collection or acute intracranial hemorrhage. Cervicomedullary junction and pituitary are within normal limits. Stable mild for age nonspecific cerebral white matter T2 and FLAIR hyperintensity, slightly greater in the left hemisphere. No cortical encephalomalacia or chronic cerebral blood products. Deep gray matter nuclei, brainstem, and cerebellum are normal for age. No abnormal enhancement identified. No dural thickening. Visible internal auditory structures appear normal. Mastoids are clear. Mild paranasal sinus mucosal thickening. Negative orbit and scalp soft tissues. Negative visualized cervical spine. Normal bone marrow signal. MRV HEAD FINDINGS Preserved flow signal in the superior sagittal sinus, the torcula, both transverse sinuses (the left is dominant) both sigmoid sinuses, and both IJ bulbs. Preserved flow signal in the straight sinus, vein of Galen, internal cerebral veins, and basal veins of Rosenthal. Flow signal in the major cortical veins appears preserved including the Trolard and veins of Labbe. IMPRESSION: 1. Normal intracranial MRV. 2. No acute intracranial abnormality. Stable MRI appearance of the brain since 2016. 3. 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 Electronically Signed   By: Odessa Fleming M.D.   On: 07/21/2015 12:02   Mr Mrv Head Wo Cm  07/21/2015  CLINICAL DATA:  65 year old male with headache and right side weakness. Initial encounter. EXAM: MRI HEAD WITHOUT AND WITH CONTRAST MRV HEAD WITHOUT CONTRAST TECHNIQUE: Multiplanar, multiecho pulse sequences of the brain and surrounding structures were obtained without and with  intravenous contrast. Angiographic images of the head were obtained using MRV technique without contrast. CONTRAST:  20 mL MultiHance COMPARISON:  CTA head and neck 07/19/2015.  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 No restricted diffusion or evidence of acute infarction. No midline shift, mass effect, evidence of mass lesion, ventriculomegaly, extra-axial collection or acute intracranial hemorrhage. Cervicomedullary junction and pituitary are within normal limits. Stable mild for age nonspecific cerebral white matter T2 and FLAIR hyperintensity, slightly greater in the left hemisphere. No cortical encephalomalacia or chronic cerebral blood products. Deep gray matter nuclei, brainstem, and cerebellum are normal for age. No abnormal enhancement identified. No dural thickening. Visible internal auditory structures appear normal. Mastoids are clear. Mild paranasal sinus mucosal thickening. Negative orbit and scalp soft tissues. Negative visualized cervical spine. Normal bone marrow signal. MRV HEAD FINDINGS Preserved flow signal in the superior sagittal  sinus, the torcula, both transverse sinuses (the left is dominant) both sigmoid sinuses, and both IJ bulbs. Preserved flow signal in the straight sinus, vein of Galen, internal cerebral veins, and basal veins of Rosenthal. Flow signal in the major cortical veins appears preserved including the [place]rol[place] and veins of Labbe. IMPRESSION: 1. Normal intracranial MRV. 2. No acute intracranial abnormality. Stable MRI appearance of the brain since 2016. 3. 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 Electronically Signed   By: Odessa Fleming M.D.   On:  07/21/2015 12:02   Ct Head Code Stroke W/o Cm  07/19/2015  CLINICAL DATA:  Severe headache and right-sided weakness today. EXAM: CT HEAD WITHOUT CONTRAST TECHNIQUE: Contiguous axial images were obtained from the base of the skull through the vertex without intravenous contrast. COMPARISON:  Head CT 01/14/2015 and MRI brain 01/14/2015 FINDINGS: Stable age related cerebral atrophy, ventriculomegaly and periventricular white matter disease. No findings for acute hemispheric infarction an or intracranial hemorrhage. No extra-axial fluid collections are identified. No mass lesions. The brainstem and cerebellum are grossly normal in stable. Vascular calcifications are noted. No acute bony findings. Extensive mucoperiosteal thickening involving the maxillary and ethmoid sinuses bilaterally. The mastoid air cells and middle ear cavities are clear. The globes are intact. IMPRESSION: No acute intracranial findings. Specifically, no findings for hemispheric infarction or intracranial hemorrhage. Stable age related cerebral atrophy and mild ventriculomegaly. Electronically Signed   By: Rudie Meyer M.D.   On: 07/19/2015 11:29    Assessment and plan:   Alexander Gonzalez is an 65 y.o. male patient with intractible severe headache despite use of several medications, including baclofen 20 mg TID, Depacon 300 mg Q6H, iv solumedrol, clonazepam 0.5 mg BID, and PRN opioids. He reports no significant change in HA, and is in distress from it . He gets intermittent relief for several hours with the medications, but the HA apparently recurs with same intensity of 10/10. He describes this HA being located in frontal, bitemporal and occipital area like a band, commonly described in a location with chronic tension type headaches.  LP was attempted at bedside today by me to rule out any other causes for this HA such as SAH or infections, although CT brain, CTA head and neck, brain MRI w/wo, and MRV studies are all negative, no fevers  or any other clinical signs or lab abnormalities to suspect infections.  LP was unsuccessful. Will place a request to be completed by radiology under flouro guidance. Request the opening pressure to be checked. CSF labs ordered.   Continue with same medications for now. Will check Depakote trough level, goal <100. Its being used to help with intractable HA.   Of note, he has basilar artery stenosis with beaded appearance of the vessel due to atherosclerotic plaque without a high grade stenosis, found on CTA head, for which he can  follow up with Dr. Corliss Skains as out patient after discharge for further evaluation / monitoring.   We'll follow-up

## 2015-07-22 NOTE — Progress Notes (Signed)
PT Cancellation Note  Patient Details Name: Fuller Mandrildward Seldon MRN: [id number] DOB: 02/25/1951   Cancelled Treatment:    Reason Eval/Treat Not Completed: Pain limiting ability to participate - discussed with OT, who found pt unable to tolerate sitting upright in bed due to 10/10 headache pain.  Until pain is managed pt will continue to be limited in ability to participate in mobility evaluation.  Will check back next date and proceed if able.  Thanks,    Dennis BastMartin, Reva Pinkley Galloway 07/22/2015, 11:45 AM

## 2015-07-22 NOTE — Evaluation (Signed)
Occupational Therapy Evaluation Patient Details Name: Alexander Gonzalez MRN: [id number] DOB: 04/15/1950 Today's Date: 07/22/2015    History of Present Illness 65 y.o. male with a Past Medical History of HTN, OA, TIA, HLD who presents with HA and R sided weakness and abnormal basilar artery circulation. IR to perform arteriogram and possible intervention. Neuro and IR following.    Clinical Impression   Pt. Is having severe pain in head which intensifies with movement. Pt. Was originally willing to attempt OOB today but when bed mobility was initiated pt. Declined to sit EOB or OOB. Pt. States that his vision is blurry since onset of headache. Pt. Has decreased I with ADLs and would benefit from further OT to maximize I with ADLs and mobility prior to d/c home.     Follow Up Recommendations  Home health OT    Equipment Recommendations  None recommended by OT    Recommendations for Other Services       Precautions / Restrictions Precautions Precautions: Fall Restrictions Weight Bearing Restrictions: No      Mobility Bed Mobility               General bed mobility comments:  (Pt. able to roll self in bed but would not sit up. )  Transfers                      Balance                                            ADL Overall ADL's : Needs assistance/impaired Eating/Feeding: Independent   Grooming: Wash/dry hands;Wash/dry face;Independent   Upper Body Bathing: Independent   Lower Body Bathing: Minimal assistance   Upper Body Dressing : Minimal assistance   Lower Body Dressing: Moderate assistance                 General ADL Comments: Pt. refused to attempt OOB activity secondary to pain.      Vision Additional Comments: Pt. was in too much pain for  vison assessment.    Perception     Praxis      Pertinent Vitals/Pain Pain Assessment: 0-10 Pain Score: 10-Worst pain ever Pain Location:  (head) Pain Descriptors /  Indicators: Headache Pain Intervention(s): Premedicated before session     Hand Dominance Left   Extremity/Trunk Assessment Upper Extremity Assessment Upper Extremity Assessment: Overall WFL for tasks assessed           Communication Communication Communication: No difficulties   Cognition Arousal/Alertness: Awake/alert Behavior During Therapy: Agitated Overall Cognitive Status: Within Functional Limits for tasks assessed                     General Comments       Exercises       Shoulder Instructions      Home Living Family/patient expects to be discharged to:: Private residence Living Arrangements: Spouse/significant other;Children Available Help at Discharge: Available PRN/intermittently Type of Home: House Home Access: Stairs to enter Entergy CorporationEntrance Stairs-Number of Steps:  (1)   Home Layout: Two level;1/2 bath on main level     Bathroom Shower/Tub: Chief Strategy OfficerTub/shower unit   Bathroom Toilet: Standard     Home Equipment: None          Prior Functioning/Environment Level of Independence: Independent  OT Diagnosis: Acute pain   OT Problem List: Decreased activity tolerance;Impaired vision/perception;Pain   OT Treatment/Interventions: Self-care/ADL training;Therapeutic activities    OT Goals(Current goals can be found in the care plan section) Acute Rehab OT Goals Patient Stated Goal:  (for head to stop hurting.) OT Goal Formulation: With patient Time For Goal Achievement: 08/05/15 Potential to Achieve Goals: Good ADL Goals Pt Will Perform Lower Body Bathing: Independently Pt Will Perform Lower Body Dressing: Independently Pt Will Transfer to Toilet: Independently Pt Will Perform Toileting - Clothing Manipulation and hygiene: Independently Pt Will Perform Tub/Shower Transfer: Tub transfer;ambulating  OT Frequency: Min 2X/week   Barriers to D/C: Decreased caregiver support          Co-evaluation              End of Session     Activity Tolerance: Patient limited by pain Patient left: in bed;with call bell/phone within reach   Time: 1010-1054 OT Time Calculation (min): 44 min Charges:  OT General Charges $OT Visit: 1 Procedure OT Evaluation $OT Eval Moderate Complexity: 1 Procedure OT Treatments $Self Care/Home Management : 8-22 mins G-Codes:    Christe Tellez July 29, 2015, 11:02 AM

## 2015-07-23 ENCOUNTER — Inpatient Hospital Stay (HOSPITAL_COMMUNITY): Payer: Medicare Other

## 2015-07-23 DIAGNOSIS — R51 Headache: Secondary | ICD-10-CM

## 2015-07-23 DIAGNOSIS — R519 Headache, unspecified: Secondary | ICD-10-CM | POA: Insufficient documentation

## 2015-07-23 DIAGNOSIS — R7309 Other abnormal glucose: Secondary | ICD-10-CM

## 2015-07-23 LAB — CBC
HEMATOCRIT: 47.2 % (ref 39.0–52.0)
HEMOGLOBIN: 15.2 g/dL (ref 13.0–17.0)
MCH: 28.2 pg (ref 26.0–34.0)
MCHC: 32.2 g/dL (ref 30.0–36.0)
MCV: 87.6 fL (ref 78.0–100.0)
Platelets: 263 10*3/uL (ref 150–400)
RBC: 5.39 MIL/uL (ref 4.22–5.81)
RDW: 14.4 % (ref 11.5–15.5)
WBC: 12.5 10*3/uL — ABNORMAL HIGH (ref 4.0–10.5)

## 2015-07-23 LAB — GLUCOSE, CAPILLARY
GLUCOSE-CAPILLARY: 130 mg/dL — AB (ref 65–99)
GLUCOSE-CAPILLARY: 111 mg/dL — AB (ref 65–99)
Glucose-Capillary: 163 mg/dL — ABNORMAL HIGH (ref 65–99)
Glucose-Capillary: 106 mg/dL — ABNORMAL HIGH (ref 65–99)

## 2015-07-23 LAB — COMPREHENSIVE METABOLIC PANEL
ALBUMIN: 3.8 g/dL (ref 3.5–5.0)
ALK PHOS: 45 U/L (ref 38–126)
ALT: 16 U/L — ABNORMAL LOW (ref 17–63)
AST: 18 U/L (ref 15–41)
Anion gap: 12 (ref 5–15)
BILIRUBIN TOTAL: 0.9 mg/dL (ref 0.3–1.2)
BUN: 47 mg/dL — ABNORMAL HIGH (ref 6–20)
CO2: 23 mmol/L (ref 22–32)
Calcium: 8.9 mg/dL (ref 8.9–10.3)
Chloride: 103 mmol/L (ref 101–111)
Creatinine, Ser: 1.8 mg/dL — ABNORMAL HIGH (ref 0.61–1.24)
GFR calc Af Amer: 44 mL/min — ABNORMAL LOW (ref >60)
GFR, EST NON AFRICAN AMERICAN: 38 mL/min — AB (ref >60)
GLUCOSE: 135 mg/dL — AB (ref 65–99)
Potassium: 4.1 mmol/L (ref 3.5–5.1)
Sodium: 138 mmol/L (ref 135–145)
Total Protein: 7.7 g/dL (ref 6.5–8.1)

## 2015-07-23 LAB — VALPROIC ACID LEVEL: Valproic Acid Lvl: 53 ug/mL (ref 50.0–100.0)

## 2015-07-23 MED ORDER — SODIUM CHLORIDE 0.9 % IV SOLN
INTRAVENOUS | Status: DC
  Administered 2015-07-23 – 2015-07-25 (×3): via INTRAVENOUS

## 2015-07-23 MED ORDER — ENOXAPARIN SODIUM 40 MG/0.4ML ~~LOC~~ SOLN: 40.0000 mg | SUBCUTANEOUS | Status: DC

## 2015-07-23 MED ORDER — HYDRALAZINE HCL 50 MG PO TABS
100.0000 mg | ORAL_TABLET | Freq: Four times a day (QID) | ORAL | Status: DC
  Administered 2015-07-23 – 2015-07-27 (×15): 100 mg via ORAL
  Filled 2015-07-23 (×15): qty 2

## 2015-07-23 MED ORDER — ENOXAPARIN SODIUM 40 MG/0.4ML ~~LOC~~ SOLN
40.0000 mg | SUBCUTANEOUS | Status: DC
  Administered 2015-07-24 – 2015-07-26 (×3): 40 mg via SUBCUTANEOUS
  Filled 2015-07-23 (×3): qty 0.4

## 2015-07-23 MED ORDER — LIDOCAINE HCL (PF) 1 % IJ SOLN
INTRAMUSCULAR | Status: AC
  Filled 2015-07-23: qty 5

## 2015-07-23 NOTE — Evaluation (Signed)
Speech Language Pathology Evaluation Patient Details Name: Alexander Gonzalez MRN: [id number] DOB: 10/27/1950 Today's Date: 07/23/2015 Time: [id number] SLP Time Calculation (min) (ACUTE ONLY): 13 min  Problem List:  Patient Active Problem List   Diagnosis Date Noted  . Cephalalgia   . Occlusion and stenosis of basilar artery   . Severe headache 07/19/2015  . Abnormal computed tomography angiography of brain 07/19/2015  . CKD (chronic kidney disease), stage III 07/19/2015  . Acute hyperglycemia 07/19/2015  . Basilar artery stenosis   . Hypertensive emergency   . Hypertensive urgency 01/14/2015  . Nausea 01/14/2015  . Cerebrovascular disease 01/14/2015  . HTN (hypertension) 11/21/2012  . Gout 11/21/2012  . Preop cardiovascular exam 11/21/2012  . Hyperlipidemia 11/21/2012   Past Medical History:  Past Medical History  Diagnosis Date  . Hypertension   . Osteoarthritis   . Mini stroke (HCC) 1996  . Hyperlipidemia    Past Surgical History:  Past Surgical History  Procedure Laterality Date  . Knee surgery  1976    right  . Replacement total knee  1996    left  . Knee surgery  1980'2    right   HPI:  65 y.o. male with h/o HTN, stage III CKD, dyslipidemia, TIA (1996) and gout, who presented to ED with c/o severe HA with possible R side weakness but no other focal neurological deficits appreciated. MRINo acute intracranial abnormality. Stable MRI appearance of the left vertebral artery rather than No prior h/o SLP intervention.   Assessment / Plan / Recommendation Clinical Impression  Speech-cognitive assessment was brief due to pt's pain and decreased alertness requiring max verbal and tactile cues (kept eyes closed). Significantly decreased respiratory support and effort resulting in single word/partial word responses with decreased vocal intensity. Pt followed 1/1 two step commands with repetition and additional time. Will continue to evaluate cognitive abilities in diagnostic  treatment.      SLP Assessment  Patient needs continued Speech Lanaguage Pathology Services    Follow Up Recommendations   (TBD)    Frequency and Duration min 2x/week  2 weeks      SLP Evaluation Prior Functioning  Cognitive/Linguistic Baseline:  (per wife) Type of Home: House  Lives With: Spouse Available Help at Discharge: Family;Available PRN/intermittently Vocation: Retired Systems analyst(police officer)   Cognition  Overall Cognitive Status: Impaired/Different from baseline Arousal/Alertness: Lethargic Orientation Level: Oriented to person Attention: Sustained Sustained Attention: Impaired Sustained Attention Impairment: Verbal basic Memory:  (to be assessed) Awareness: Impaired Awareness Impairment: Anticipatory impairment;Emergent impairment Problem Solving:  (to be assessed in dx therapy) Safety/Judgment: Impaired    Comprehension  Auditory Comprehension Overall Auditory Comprehension: Impaired Yes/No Questions: Not tested Commands:  (1/1 two step command) Interfering Components: Attention;Pain Visual Recognition/Discrimination Discrimination: Not tested Reading Comprehension Reading Status: Not tested    Expression Expression Primary Mode of Expression: Verbal Verbal Expression Overall Verbal Expression: Impaired Initiation: Impaired Repetition:  (NT) Naming: Not tested Pragmatics: Impairment Impairments: Eye contact;Monotone Written Expression Dominant Hand: Left Written Expression: Not tested   Oral / Motor  Oral Motor/Sensory Function Overall Oral Motor/Sensory Function: Generalized oral weakness Motor Speech Overall Motor Speech: Impaired Respiration: Impaired Level of Impairment: Word Phonation: Low vocal intensity Resonance:  (will assess further) Articulation: Within functional limitis Intelligibility: Intelligibility reduced Word: 0-24% accurate Motor Planning: Witnin functional limits   GO                    Royce MacadamiaLitaker, Bernyce Brimley Willis 07/23/2015,  2:54 PM   Breck CoonsLisa Willis Jannessa Ogden M.Ed  CCC-SLP Pager [phone]

## 2015-07-23 NOTE — Care Management Note (Signed)
Case Management Note  Patient Details  Name: Fuller Mandrildward Gavigan MRN: [id number] Date of Birth: 02/07/1951  Subjective/Objective:                    Action/Plan: Patient continues with IV steroids. Scheduled for LP tomorrow. CM continuing to follow for discharge needs.   Expected Discharge Date:                  Expected Discharge Plan:     In-House Referral:     Discharge planning Services     Post Acute Care Choice:    Choice offered to:     DME Arranged:    DME Agency:     HH Arranged:    HH Agency:     Status of Service:  In process, will continue to follow  Medicare Important Message Given:    Date Medicare IM Given:    Medicare IM give by:    Date Additional Medicare IM Given:    Additional Medicare Important Message give by:     If discussed at Long Length of Stay Meetings, dates discussed:    Additional Comments:  Kermit BaloKelli F Christa Fasig, RN 07/23/2015, 2:33 PM

## 2015-07-23 NOTE — Progress Notes (Signed)
PT Cancellation Note  Patient Details Name: Fuller Mandrildward Zoll MRN: [id number] DOB: 03/04/1951   Cancelled Treatment:    Reason Eval/Treat Not Completed: Pain limiting ability to participate; have initiated mobility with pt this morning, but continued to cry and not move despite much encouragement and time given.  Wife arrived and encouraged her to work with elevating HOB in increments as he is able to acclimate to upright and for RN to call PT when pt able to participate.  Will return later as pt able to participate.   Elray McgregorCynthia Fransisco Messmer 07/23/2015, 10:38 AM  Sheran Lawlessyndi Lando Alcalde, PT [phone] 07/23/2015

## 2015-07-23 NOTE — Progress Notes (Signed)
Subjective: In bed keeps eyes closed, minimally involved in exam. States his head is still hurting him severely--bilateral temp and occipital. Throbbing. Worse when getting up. His HA has not changed and was like this prior to LP attempt.    Currently on Baclofen 20 mg TID, Methylprednisolone 125 Q 6, Depakote 300 Q 6 hours with VPA level 53 Exam: Filed Vitals:   07/23/15 0127 07/23/15 0521  BP: 144/51 156/73  Pulse: 87 84  Temp: 97.4 F (36.3 C) 98.1 F (36.7 C)  Resp: 20 20    HEENT-  Normocephalic, no lesions, without obvious abnormality.  Normal external eye and conjunctiva.  Normal TM's bilaterally.  Normal auditory canals and external ears. Normal external nose, mucus membranes and septum.  Normal pharynx. Cardiovascular- S1, S2 normal, pulses palpable throughout   Lungs- chest clear, no wheezing, rales, normal symmetric air entry Abdomen- normal findings: bowel sounds normal Extremities- no edema Lymph-no adenopathy palpable Musculoskeletal-no joint tenderness, deformity or swelling Skin-warm and dry, no hyperpigmentation, vitiligo, or suspicious lesions    Gen: In bed, NAD MS: alert, does not want to take part in exam due to pain WU:[id number]:PERRLA, EOMI, TML, Face symmetric vision intact Motor: MAEW  Sensory: intact throughout DTR:2+ thoroughout  Pertinent Labs: VPA 53  Felicie MornDavid Smith PA-C [facility] [phone]  Impression:  65 yo M with intractable headache, awaiting LP. There does appear to be a positional component to this headache, if no explanation on LP, will pursue further imaging .  Recommendations: 1) Lumbar puncture as ordered, 2) Discontinue steroids as infection has not been ruled out.  3) will continue to follow.    Ritta SlotMcNeill Kirkpatrick, MD [facility] [phone]  If 7pm- 7am, please page neurology on call as listed in AMION.  07/23/2015, 9:44 AM

## 2015-07-23 NOTE — Progress Notes (Signed)
[facility] PROGRESS NOTE  Lawrance Wiedemann ZOX:[id number] DOB: 21-Mar-1951 DOA: 07/19/2015   PCP: Katy Apo, MD     Assessment/Plan: Principal Problem:   Severe headache Active Problems:   HTN (hypertension)   Hyperlipidemia   Abnormal computed tomography angiography of brain   CKD (chronic kidney disease), stage III   Acute hyperglycemia   Basilar artery stenosis   Hypertensive emergency   Occlusion and stenosis of basilar artery   Cephalalgia    Brief summary 65 y.o. male with a Past Medical History of HTN, OA, TIA, HLD who presents with HA and R sided weakness and abnormal basilar artery circulation. IR hat scheduled to perform arteriogram and possible intervention, this was canceled due to intractable headache and he recommended this to be done as an outpatient. Neuro and IR following   Assessment and plan Severe headache/ Abnormal computed tomography angiography of brain -Patient presented with severe headache transient right sided weakness (primarily hand) and elevated blood pressure and reported compliance with medications-imaging reveals basilar circulatory abnormalities-CT head without acute stroke and NIHSS was 0 Neurology consultation obtained, interventional radiology also consulted -IR recommended cerebral arteriogram with possibility of pursuing intracranial vascular intervention if indicated (concern over possible aneurysm), 4/21, however unable to be completed due to intractable headache Headache management per neurology, IV Depacon to every 6 hours, IV Solu-Medrol 125 g every 6 hours, baclofen 10 mg 3 times a day , start tapering steroids, duration of Depacon/Solu-Medrol? Patient also received Reglan 2 doses MRI, MRV for severe headache was normal no intracranial abnormality, stable MRI since 2016 IR plans to do angiogram as outpatient  LP was attempted at bedside today by me to rule out any other causes for this HA such as SAH or infections,  although CT brain, CTA head and neck, brain MRI w/wo, and MRV studies are all negative, no fevers or any other clinical signs or lab abnormalities to suspect infections. Bedside LP unsuccessful therefore the patient will receive IR guided LP on 4/25 Check Depakote level, goal less than 100   HTN (hypertension), improved Continue Norvasc, hydralazine, hold Cozaar due to increasing creatinine, -Hydralazine prn 2-D echo results LV EF: 65% - 70%   CKD (chronic kidney disease), stage III, baseline 1.4 acute kidney injury, -Likely related to hypertension, prerenal Discontinue Cozaar, gentle hydration and follow    Hyperlipidemia -Continue preadmission Zocor   Acute hyperglycemia Hemoglobin A1c 6.1  Chest pain , noncardiac  Like esophageal spasma, EKG unchanged  Cardiac enzymes negative D-dimer negative   DVT prophylaxsis Lovenox  Code Status:  Full code    Code Status Orders       Family Communication: Discussed in detail with the patient/Daughter in the room, all imaging results, lab results explained to the patient   Disposition Plan:  Anticipate discharge in 2-3 days, Patient refusing to go home, states that nobody is going to take care of him at home, However not participating with therapist to determine if he needs SNF      Consultants:  Neurology  Interventional radiology  *  Procedures:  None  Antibiotics: Anti-infectives    None         HPI/Subjective: Had nausea this morning associated with his headache  Objective: Filed Vitals:   07/22/15 1736 07/22/15 2149 07/23/15 0127 07/23/15 0521  BP: 147/77 146/64 144/51 156/73  Pulse: 96 87 87 84  Temp: 98.7 F (37.1 C) 98.2 F (36.8 C) 97.4 F (36.3 C) 98.1 F (36.7 C)  TempSrc: Oral Oral Axillary Oral  Resp: 20 20 20 20   Weight:      SpO2: 97% 100% 97% 98%   No intake or output data in the 24 hours ending 07/23/15 0851  Exam:  Examination:  General exam: Appears calm and  comfortable  Respiratory system: Clear to auscultation. Respiratory effort normal. Cardiovascular system: S1 & S2 heard, RRR. No JVD, murmurs, rubs, gallops or clicks. No pedal edema. Gastrointestinal system: Abdomen is nondistended, soft and nontender. No organomegaly or masses felt. Normal bowel sounds heard. Central nervous system: Alert and oriented. No focal neurological deficits. Extremities: Symmetric 5 x 5 power. Skin: No rashes, lesions or ulcers Psychiatry: Judgement and insight appear normal. Mood & affect appropriate.     Data Reviewed: I have personally reviewed following labs and imaging studies  Micro Results No results found for this or any previous visit (from the past 240 hour(s)).  Radiology Reports Ct Angio Head W/cm &/or Wo Cm  07/19/2015  CLINICAL DATA:  65 year old male code stroke. Severe headache and weakness. Right side weakness. Initial encounter. EXAM: CT ANGIOGRAPHY HEAD AND NECK TECHNIQUE: Multidetector CT imaging of the head and neck was performed using the standard protocol during bolus administration of intravenous contrast. Multiplanar CT image reconstructions and MIPs were obtained to evaluate the vascular anatomy. Carotid stenosis measurements (when applicable) are obtained utilizing NASCET criteria, using the distal internal carotid diameter as the denominator. CONTRAST:  80 mL Isovue 370. COMPARISON:  Head CT without contrast 1123 hours today. Brain MRI 01/14/2015. FINDINGS: CTA NECK Skeleton: Intermittent poor dentition. Degenerative changes in the cervical and visualized thoracic spine. No acute osseous abnormality identified. Mild to moderate paranasal sinus mucosal thickening with chronic periosteal thickening suggesting chronic sinus disease. Other neck: Mediastinal lipomatosis. No superior mediastinal lymphadenopathy. Negative visualized lung apices aside from atelectasis. Negative thyroid, larynx, pharynx, parapharyngeal spaces, retropharyngeal space,  sublingual space, submandibular glands and parotid glands. No cervical lymphadenopathy. Aortic arch: Bovine arch configuration. Minimal to mild calcified arch atherosclerosis. No great vessel origin stenosis. Right carotid system: Mildly tortuous right CCA. Bulky soft and to a lesser extent calcified plaque at the right carotid bifurcation resulting in right ICA origin stenosis of 65 % with respect to the distal vessel. Otherwise negative cervical right ICA. Left carotid system: Tortuous proximal left CCA. Bovine type left CCA origin. At the left carotid bifurcation there is circumferential soft plaque at the left ICA origin, but less than 50 % stenosis with respect to the distal vessel results (series 9, image 119). Otherwise negative cervical left ICA. Vertebral arteries: No proximal right subclavian artery stenosis. Soft plaque with mild stenosis at the right vertebral artery origin (series 9, image 141). Dominant right vertebral artery otherwise is widely patent to the skullbase. No proximal left subclavian artery stenosis. Non dominant left vertebral artery with a normal origin. Tortuous left V1 segment. No left vertebral artery stenosis to the skullbase. CTA HEAD Posterior circulation: Dominant right vertebral artery with V4 segment calcified plaque resulting an mild to moderate stenosis. Right PICA origin is patent. Non dominant left vertebral artery terminates in the left PICA. Calcified plaque at the basilar artery origin not resulting in stenosis, but there is widespread basilar artery irregularity related to soft atherosclerotic plaque such that a beaded appearance occurs throughout the basilar artery (series 12 images 27 and 28). No high-grade stenosis occurs. The basilar tip remains patent. SCA and MCA origins are patent, but there is moderate to severe stenosis at the left PCA origin. There  is additional moderate tandem left P2 segment stenosis. Bilateral distal PCA branches remain patent. Anterior  circulation: Soft plaque in the left ICA at the skullbase resulting in up to 50 % stenosis with respect to the distal vessel. Left ICA siphon is patent with mild cavernous and moderate supraclinoid segment calcified plaque not resulting in stenosis. Patent left ICA terminus. Normal left ophthalmic artery origin. Mild cavernous and moderate supra clinoid right ICA calcified plaque without stenosis. Normal right ophthalmic artery origin. Patent right ICA terminus. Right MCA and ACA origins are within normal limits. Fenestrated anterior communicating artery is present. Bilateral A2 and distal ACA branches are within normal limits. Right MCA M1 segment, bifurcation, and right MCA branches are within normal limits. Left ACA origin is highly stenotic such that there is minimal if any flow in the left A1 segment. Still, the left A2 is adequately supplied by the anterior communicating artery. Left MCA origin, M1 segment, bifurcation, and left MCA branches are within normal limits. Venous sinuses: Patent. Anatomic variants: Dominant right vertebral artery. Left vertebral artery terminates in PICA. Bovine type arch configuration. IMPRESSION: 1. Negative for emergent large vessel occlusion. 2. Bulky soft plaque at the right carotid bifurcation resulting in 65% stenosis of the right ICA origin. 3. Severe intracranial basilar artery atherosclerosis, resulting in a beaded appearance of the vessel throughout its course, but no hemodynamically significant basilar artery stenosis. There is superimposed mild to moderate stenosis of the right vertebral artery V4 segment which supplies the basilar. The non dominant left vertebral artery terminates in the left PICA. 4. Severe stenosis at the left PCA origin with preserved distal flow. 5. Severe stenosis at the left ACA origin with adequately reconstituted A2 segment flow via the anterior communicating artery. 6. Other intracranial and extracranial atherosclerosis is not hemodynamically  significant. Electronically Signed   By: Odessa Fleming M.D.   On: 07/19/2015 12:51   Dg Chest 2 View  07/20/2015  CLINICAL DATA:  Dry cough and severe headache yesterday, history hypertension EXAM: CHEST  2 VIEW COMPARISON:  None FINDINGS: Lordotic positioning with slight rotation to the RIGHT on AP view. Normal heart size, mediastinal contours and pulmonary vascularity. Elevation of RIGHT diaphragm with RIGHT basilar atelectasis. Lungs otherwise clear. No pleural effusion or pneumothorax. IMPRESSION: RIGHT basilar atelectasis. Electronically Signed   By: Ulyses Southward M.D.   On: 07/20/2015 11:16   Ct Angio Neck W/cm &/or Wo/cm  07/19/2015  CLINICAL DATA:  65 year old male code stroke. Severe headache and weakness. Right side weakness. Initial encounter. EXAM: CT ANGIOGRAPHY HEAD AND NECK TECHNIQUE: Multidetector CT imaging of the head and neck was performed using the standard protocol during bolus administration of intravenous contrast. Multiplanar CT image reconstructions and MIPs were obtained to evaluate the vascular anatomy. Carotid stenosis measurements (when applicable) are obtained utilizing NASCET criteria, using the distal internal carotid diameter as the denominator. CONTRAST:  80 mL Isovue 370. COMPARISON:  Head CT without contrast 1123 hours today. Brain MRI 01/14/2015. FINDINGS: CTA NECK Skeleton: Intermittent poor dentition. Degenerative changes in the cervical and visualized thoracic spine. No acute osseous abnormality identified. Mild to moderate paranasal sinus mucosal thickening with chronic periosteal thickening suggesting chronic sinus disease. Other neck: Mediastinal lipomatosis. No superior mediastinal lymphadenopathy. Negative visualized lung apices aside from atelectasis. Negative thyroid, larynx, pharynx, parapharyngeal spaces, retropharyngeal space, sublingual space, submandibular glands and parotid glands. No cervical lymphadenopathy. Aortic arch: Bovine arch configuration. Minimal to mild  calcified arch atherosclerosis. No great vessel origin stenosis. Right carotid system: Mildly  tortuous right CCA. Bulky soft and to a lesser extent calcified plaque at the right carotid bifurcation resulting in right ICA origin stenosis of 65 % with respect to the distal vessel. Otherwise negative cervical right ICA. Left carotid system: Tortuous proximal left CCA. Bovine type left CCA origin. At the left carotid bifurcation there is circumferential soft plaque at the left ICA origin, but less than 50 % stenosis with respect to the distal vessel results (series 9, image 119). Otherwise negative cervical left ICA. Vertebral arteries: No proximal right subclavian artery stenosis. Soft plaque with mild stenosis at the right vertebral artery origin (series 9, image 141). Dominant right vertebral artery otherwise is widely patent to the skullbase. No proximal left subclavian artery stenosis. Non dominant left vertebral artery with a normal origin. Tortuous left V1 segment. No left vertebral artery stenosis to the skullbase. CTA HEAD Posterior circulation: Dominant right vertebral artery with V4 segment calcified plaque resulting an mild to moderate stenosis. Right PICA origin is patent. Non dominant left vertebral artery terminates in the left PICA. Calcified plaque at the basilar artery origin not resulting in stenosis, but there is widespread basilar artery irregularity related to soft atherosclerotic plaque such that a beaded appearance occurs throughout the basilar artery (series 12 images 27 and 28). No high-grade stenosis occurs. The basilar tip remains patent. SCA and MCA origins are patent, but there is moderate to severe stenosis at the left PCA origin. There is additional moderate tandem left P2 segment stenosis. Bilateral distal PCA branches remain patent. Anterior circulation: Soft plaque in the left ICA at the skullbase resulting in up to 50 % stenosis with respect to the distal vessel. Left ICA siphon is  patent with mild cavernous and moderate supraclinoid segment calcified plaque not resulting in stenosis. Patent left ICA terminus. Normal left ophthalmic artery origin. Mild cavernous and moderate supra clinoid right ICA calcified plaque without stenosis. Normal right ophthalmic artery origin. Patent right ICA terminus. Right MCA and ACA origins are within normal limits. Fenestrated anterior communicating artery is present. Bilateral A2 and distal ACA branches are within normal limits. Right MCA M1 segment, bifurcation, and right MCA branches are within normal limits. Left ACA origin is highly stenotic such that there is minimal if any flow in the left A1 segment. Still, the left A2 is adequately supplied by the anterior communicating artery. Left MCA origin, M1 segment, bifurcation, and left MCA branches are within normal limits. Venous sinuses: Patent. Anatomic variants: Dominant right vertebral artery. Left vertebral artery terminates in PICA. Bovine type arch configuration. IMPRESSION: 1. Negative for emergent large vessel occlusion. 2. Bulky soft plaque at the right carotid bifurcation resulting in 65% stenosis of the right ICA origin. 3. Severe intracranial basilar artery atherosclerosis, resulting in a beaded appearance of the vessel throughout its course, but no hemodynamically significant basilar artery stenosis. There is superimposed mild to moderate stenosis of the right vertebral artery V4 segment which supplies the basilar. The non dominant left vertebral artery terminates in the left PICA. 4. Severe stenosis at the left PCA origin with preserved distal flow. 5. Severe stenosis at the left ACA origin with adequately reconstituted A2 segment flow via the anterior communicating artery. 6. Other intracranial and extracranial atherosclerosis is not hemodynamically significant. Electronically Signed   By: Odessa Fleming M.D.   On: 07/19/2015 12:51   Mr Laqueta Jean ZO Contrast  07/21/2015  CLINICAL DATA:  65 year old  male with headache and right side weakness. Initial encounter. EXAM: MRI HEAD WITHOUT AND  WITH CONTRAST MRV HEAD WITHOUT CONTRAST TECHNIQUE: Multiplanar, multiecho pulse sequences of the brain and surrounding structures were obtained without and with intravenous contrast. Angiographic images of the head were obtained using MRV technique without contrast. CONTRAST:  20 mL MultiHance COMPARISON:  CTA head and neck 07/19/2015.  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 No restricted diffusion or evidence of acute infarction. No midline shift, mass effect, evidence of mass lesion, ventriculomegaly, extra-axial collection or acute intracranial hemorrhage. Cervicomedullary junction and pituitary are within normal limits. Stable mild for age nonspecific cerebral white matter T2 and FLAIR hyperintensity, slightly greater in the left hemisphere. No cortical encephalomalacia or chronic cerebral blood products. Deep gray matter nuclei, brainstem, and cerebellum are normal for age. No abnormal enhancement identified. No dural thickening. Visible internal auditory structures appear normal. Mastoids are clear. Mild paranasal sinus mucosal thickening. Negative orbit and scalp soft tissues. Negative visualized cervical spine. Normal bone marrow signal. MRV HEAD FINDINGS Preserved flow signal in the superior sagittal sinus, the torcula, both transverse sinuses (the left is dominant) both sigmoid sinuses, and both IJ bulbs. Preserved flow signal in the straight sinus, vein of Galen, internal cerebral veins, and basal veins of Rosenthal. Flow signal in the major cortical veins appears preserved including the Trolard and veins of Labbe. IMPRESSION: 1. Normal intracranial MRV. 2. No acute intracranial abnormality. Stable MRI  appearance of the brain since 2016. 3. 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 Electronically Signed   By: Odessa Fleming M.D.   On: 07/21/2015 12:02   Mr Mrv Head Wo Cm  07/21/2015  CLINICAL DATA:  65 year old male with headache and right side weakness. Initial encounter. EXAM: MRI HEAD WITHOUT AND WITH CONTRAST MRV HEAD WITHOUT CONTRAST TECHNIQUE: Multiplanar, multiecho pulse sequences of the brain and surrounding structures were obtained without and with intravenous contrast. Angiographic images of the head were obtained using MRV technique without contrast. CONTRAST:  20 mL MultiHance COMPARISON:  CTA head and neck 07/19/2015.  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 No restricted diffusion or evidence of acute infarction. No midline shift, mass effect, evidence of mass lesion, ventriculomegaly, extra-axial collection or acute intracranial hemorrhage. Cervicomedullary junction and pituitary are within normal limits. Stable mild for age nonspecific cerebral white matter T2 and FLAIR hyperintensity, slightly greater in the left hemisphere. No cortical encephalomalacia or chronic cerebral blood products. Deep gray matter nuclei, brainstem, and cerebellum are normal for age. No abnormal enhancement identified. No dural thickening. Visible internal auditory structures appear normal. Mastoids are clear. Mild paranasal sinus mucosal thickening. Negative orbit and scalp soft tissues. Negative visualized cervical spine. Normal bone marrow signal. MRV HEAD FINDINGS Preserved flow signal in the superior sagittal sinus, the torcula, both transverse sinuses (the left is dominant) both sigmoid  sinuses, and both IJ bulbs. Preserved flow signal in the straight sinus, vein of Galen, internal cerebral veins, and basal veins of Rosenthal. Flow signal in the major cortical veins appears preserved including the [place]rol[place] and veins of Labbe. IMPRESSION: 1. Normal intracranial MRV. 2. No acute intracranial abnormality.  Stable MRI appearance of the brain since 2016. 3. 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 Electronically Signed   By: Odessa FlemingH  Hall M.D.   On: 07/21/2015 12:02   Ct Head Code Stroke W/o Cm  07/19/2015  CLINICAL DATA:  Severe headache and right-sided weakness today. EXAM: CT HEAD WITHOUT CONTRAST TECHNIQUE: Contiguous axial images were obtained from the base of the skull through the vertex without intravenous contrast. COMPARISON:  Head CT 01/14/2015 and MRI brain 01/14/2015 FINDINGS: Stable age related cerebral atrophy, ventriculomegaly and periventricular white matter disease. No findings for acute hemispheric infarction an or intracranial hemorrhage. No extra-axial fluid collections are identified. No mass lesions. The brainstem and cerebellum are grossly normal in stable. Vascular calcifications are noted. No acute bony findings. Extensive mucoperiosteal thickening involving the maxillary and ethmoid sinuses bilaterally. The mastoid air cells and middle ear cavities are clear. The globes are intact. IMPRESSION: No acute intracranial findings. Specifically, no findings for hemispheric infarction or intracranial hemorrhage. Stable age related cerebral atrophy and mild ventriculomegaly. Electronically Signed   By: Rudie MeyerP.  Gallerani M.D.   On: 07/19/2015 11:29     CBC  Recent Labs Lab 07/19/15 1114 07/19/15 1122 07/20/15 0605 07/21/15 0209 07/22/15 0520 07/23/15 0424  WBC 5.9  --  11.5* 15.4* 15.5* 12.5*  HGB 13.7 16.0 14.2 13.9 14.7 15.2  HCT 42.2 47.0 43.4 43.5 46.7 47.2  PLT  177  --  218 227 246 263  MCV 85.9  --  85.4 88.4 87.8 87.6  MCH 27.9  --  28.0 28.3 27.6 28.2  MCHC 32.5  --  32.7 32.0 31.5 32.2  RDW 13.6  --  13.5 13.9 14.0 14.4  LYMPHSABS 2.1  --   --   --   --   --   MONOABS 0.4  --   --   --   --   --   EOSABS 0.3  --   --   --   --   --   BASOSABS 0.0  --   --   --   --   --     Chemistries   Recent Labs Lab 07/19/15 1114 07/19/15 1122 07/20/15 0605 07/21/15 0209 07/22/15 0520 07/23/15 0424  NA 140 140 136 138 137 138  K 4.5 4.4 4.4 4.8 4.6 4.1  CL 105 104 101 103 101 103  CO2 22  --  23 24 22 23   GLUCOSE 157* 153* 147* 133* 138* 135*  BUN 14 18 20  24* 37* 47*  CREATININE 1.44* 1.40* 1.27* 1.33* 1.53* 1.80*  CALCIUM 9.2  --  9.2 9.1 9.2 8.9  AST 26  --   --  17 24 18   ALT 21  --   --  17 18 16*  ALKPHOS 59  --   --  54 52 45  BILITOT 0.9  --   --  0.5 0.8 0.9   ------------------------------------------------------------------------------------------------------------------ CrCl cannot be calculated (Unknown ideal weight.). ------------------------------------------------------------------------------------------------------------------ No results for input(s): HGBA1C in the last 72 hours. ------------------------------------------------------------------------------------------------------------------ No results for input(s): CHOL, HDL, LDLCALC, TRIG, CHOLHDL, LDLDIRECT in the last 72 hours. ------------------------------------------------------------------------------------------------------------------ No results for input(s): TSH, T4TOTAL, T3FREE, THYROIDAB in the last 72 hours.  Invalid input(s): FREET3 ------------------------------------------------------------------------------------------------------------------ No results for input(s): VITAMINB12, FOLATE, FERRITIN, TIBC, IRON, RETICCTPCT in the last 72 hours.  Coagulation profile  Recent Labs Lab 07/19/15 1114  INR 1.10  Recent Labs  07/20/15 1211   DDIMER <0.27    Cardiac Enzymes  Recent Labs Lab 07/20/15 1211  TROPONINI <0.03   ------------------------------------------------------------------------------------------------------------------ Invalid input(s): POCBNP   CBG:  Recent Labs Lab 07/22/15 0612 07/22/15 1102 07/22/15 1630 07/22/15 2147 07/23/15 0609  GLUCAP 130* 120* 121* 114* 130*       Studies: Mr Lodema Pilot Contrast  Aug 18, 2015  CLINICAL DATA:  65 year old male with headache and right side weakness. Initial encounter. EXAM: MRI HEAD WITHOUT AND WITH CONTRAST MRV HEAD WITHOUT CONTRAST TECHNIQUE: Multiplanar, multiecho pulse sequences of the brain and surrounding structures were obtained without and with intravenous contrast. Angiographic images of the head were obtained using MRV technique without contrast. CONTRAST:  20 mL MultiHance COMPARISON:  CTA head and neck 07/19/2015.  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 No restricted diffusion or evidence of acute infarction. No midline shift, mass effect, evidence of mass lesion, ventriculomegaly, extra-axial collection or acute intracranial hemorrhage. Cervicomedullary junction and pituitary are within normal limits. Stable mild for age nonspecific cerebral white matter T2 and FLAIR hyperintensity, slightly greater in the left hemisphere. No cortical encephalomalacia or chronic cerebral blood products. Deep gray matter nuclei, brainstem, and cerebellum are normal for age. No abnormal enhancement identified. No dural thickening. Visible internal auditory structures appear normal. Mastoids are clear. Mild paranasal sinus mucosal thickening. Negative orbit and scalp soft tissues. Negative visualized cervical spine. Normal bone marrow signal. MRV HEAD  FINDINGS Preserved flow signal in the superior sagittal sinus, the torcula, both transverse sinuses (the left is dominant) both sigmoid sinuses, and both IJ bulbs. Preserved flow signal in the straight sinus, vein of Galen, internal cerebral veins, and basal veins of Rosenthal. Flow signal in the major cortical veins appears preserved including the [place]rol[place] and veins of Labbe. IMPRESSION: 1. Normal intracranial MRV. 2. No acute intracranial abnormality. Stable MRI appearance of the brain since 2016. 3. 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 Electronically Signed   By: Odessa Fleming M.D.   On: 2015/08/18 12:02   Mr Mrv Head Wo Cm  08/18/2015  CLINICAL DATA:  65 year old male with headache and right side weakness. Initial encounter. EXAM: MRI HEAD WITHOUT AND WITH CONTRAST MRV HEAD WITHOUT CONTRAST TECHNIQUE: Multiplanar, multiecho pulse sequences of the brain and surrounding structures were obtained without and with intravenous contrast. Angiographic images of the head were obtained using MRV technique without contrast. CONTRAST:  20 mL MultiHance COMPARISON:  CTA head and neck 07/19/2015.  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 No restricted diffusion or evidence of acute infarction. No midline shift, mass effect, evidence of mass lesion, ventriculomegaly, extra-axial collection or acute intracranial hemorrhage. Cervicomedullary junction and pituitary are within normal limits. Stable mild for age nonspecific cerebral white matter T2 and FLAIR hyperintensity, slightly greater in the left hemisphere. No cortical encephalomalacia or chronic cerebral blood products. Deep gray matter nuclei,  brainstem, and cerebellum are normal for age. No abnormal enhancement identified. No dural thickening. Visible internal auditory structures appear normal. Mastoids are clear. Mild paranasal sinus mucosal  thickening. Negative orbit and scalp soft tissues. Negative visualized cervical spine. Normal bone marrow signal. MRV HEAD FINDINGS Preserved flow signal in the superior sagittal sinus, the torcula, both transverse sinuses (the left is dominant) both sigmoid sinuses, and both IJ bulbs. Preserved flow signal in the straight sinus, vein of Galen, internal cerebral veins, and basal veins of Rosenthal. Flow signal in the major cortical veins appears preserved including the Trolard and veins of Labbe. IMPRESSION: 1. Normal intracranial MRV. 2. No acute intracranial abnormality. Stable MRI appearance of the brain since 2016. 3. 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 Electronically Signed   By: Odessa Fleming M.D.   On: 07/21/2015 12:02      Lab Results  Component Value Date   HGBA1C 6.1* 07/20/2015   HGBA1C 5.9* 01/15/2015   Lab Results  Component Value Date   LDLCALC 122* 07/20/2015   CREATININE 1.80* 07/23/2015       Scheduled Meds: . amLODipine  10 mg Oral Daily  . aspirin EC  81 mg Oral Daily  . baclofen  20 mg Oral TID  . clonazePAM  0.5 mg Oral BID  . enoxaparin (LOVENOX) injection  40 mg Subcutaneous Q24H  . hydrALAZINE  100 mg Oral Q6H  . lidocaine (PF)      . methylPREDNISolone (SOLU-MEDROL) injection  125 mg Intravenous Q6H  . pantoprazole  40 mg Oral Daily  . simvastatin  20 mg Oral QPM  . valproate sodium  300 mg Intravenous Q6H   Continuous Infusions: . sodium chloride       LOS: 3 days     Time spent: >30 MINS    [facility]  [facility] Pager [phone]. If 7PM-7AM, please contact night-coverage at [email or web address], password [facility] 07/23/2015, 8:51 AM   LOS: 3 days

## 2015-07-23 NOTE — Evaluation (Signed)
Physical Therapy Evaluation Patient Details Name: Alexander Gonzalez MRN: [id number] DOB: 1950/09/11 Today's Date: 07/23/2015   History of Present Illness  65 y.o. male with a Past Medical History of HTN, OA, TIA, HLD who presents with HA and R sided weakness and abnormal basilar artery circulation. IR to perform arteriogram and possible intervention. Neuro and IR following.   Clinical Impression  Patient presents with decreased mobility due to pain and generalized weakness.  He has significant dependencies for mobility at this time and would not be safe for d/c home with wife intermittent assist.  Feel he needs SNF level rehab to allow gradual progression of mobility as tolerated prior to d/c home.  Will follow acutely and progress as tolerated.     Follow Up Recommendations SNF    Equipment Recommendations  Rolling walker with 5" wheels    Recommendations for Other Services       Precautions / Restrictions Precautions Precautions: Fall Restrictions Weight Bearing Restrictions: No      Mobility  Bed Mobility Overal bed mobility: Needs Assistance Bed Mobility: Supine to Sit     Supine to sit: HOB elevated     General bed mobility comments: daughter and wife assisting to sit due to pt wanting thier assist  Transfers Overall transfer level: Needs assistance Equipment used: 2 person hand held assist Transfers: Sit to/from [employer] Sit to Stand: +2 physical assistance;Mod assist;From elevated surface Stand pivot transfers: +2 physical assistance;Mod assist       General transfer comment: assist for anterior weight shift and to rise from higher surface due to R knee flexion limitations; then placed pt's arm over my shoulder and daughter did the same to allow to step to chair and assist to lower for safety; much encouragement and time needed for participation  Once in recliner assist to recline for comfort  Ambulation/Gait                Stairs             Wheelchair Mobility    Modified Rankin (Stroke Patients Only)       Balance Overall balance assessment: Needs assistance Sitting-balance support: Feet supported Sitting balance-Leahy Scale: Fair     Standing balance support: Bilateral upper extremity supported Standing balance-Leahy Scale: Poor Standing balance comment: tall pt standing with flexed posture and heavy UE support given                             Pertinent Vitals/Pain Pain Assessment: Faces Faces Pain Scale: Hurts worst Pain Location: head Pain Descriptors / Indicators: Headache Pain Intervention(s): Monitored during session;Repositioned;Premedicated before session;Utilized relaxation techniques;Limited activity within patient's tolerance    Home Living Family/patient expects to be discharged to:: Private residence Living Arrangements: Spouse/significant other;Children Available Help at Discharge: Family;Available PRN/intermittently Type of Home: House Home Access: Stairs to enter Entrance Stairs-Rails: None Entrance Stairs-Number of Steps: 2 Home Layout: Two level;1/2 bath on main level;Bed/bath upstairs Home Equipment: None      Prior Function Level of Independence: Independent         Comments: reports some difficutly with stairs due to needing knee replacement on right     Hand Dominance        Extremity/Trunk Assessment   Upper Extremity Assessment: Generalized weakness           Lower Extremity Assessment: Generalized weakness;RLE deficits/detail RLE Deficits / Details: not specifically tested, but doesn't bend as much as  L in sitting; scars on both knees from surgeries       Communication   Communication: No difficulties  Cognition Arousal/Alertness: Awake/alert Behavior During Therapy: Agitated;Anxious Overall Cognitive Status: Within Functional Limits for tasks assessed (seems oriented, but not specifically tested)                       General Comments General comments (skin integrity, edema, etc.): attempted 3-4 times to assist pt up, wife in room and assisted with gradual increas of HOB to allow pt time to adjust to upright    Exercises        Assessment/Plan    PT Assessment    PT Diagnosis Acute pain;Generalized weakness;Difficulty walking   PT Problem List    PT Treatment Interventions     PT Goals (Current goals can be found in the Care Plan section) Acute Rehab PT Goals Patient Stated Goal: to help pain PT Goal Formulation: With patient/family Time For Goal Achievement: 08/06/15 Potential to Achieve Goals: Good    Frequency     Barriers to discharge        Co-evaluation               End of Session   Activity Tolerance: Patient limited by pain Patient left: in chair;with call bell/phone within reach;with family/visitor present;with chair alarm set Nurse Communication: Mobility status         Time: 1124 [phone] PT Time Calculation (min) (ACUTE ONLY): 34 min   Charges:   PT Evaluation $PT Eval High Complexity: 1 Procedure PT Treatments $Therapeutic Activity: 23-37 mins   PT G CodesElray Mcgregor:        [name] 07/23/2015, 12:51 PM [name], PT [phone] 07/23/2015

## 2015-07-24 ENCOUNTER — Inpatient Hospital Stay (HOSPITAL_COMMUNITY): Payer: Medicare Other

## 2015-07-24 LAB — COMPREHENSIVE METABOLIC PANEL
ALBUMIN: 3.4 g/dL — AB (ref 3.5–5.0)
ALT: 13 U/L — ABNORMAL LOW (ref 17–63)
ANION GAP: 13 (ref 5–15)
AST: 17 U/L (ref 15–41)
Alkaline Phosphatase: 40 U/L (ref 38–126)
BILIRUBIN TOTAL: 1 mg/dL (ref 0.3–1.2)
BUN: 46 mg/dL — AB (ref 6–20)
CHLORIDE: 104 mmol/L (ref 101–111)
CO2: 22 mmol/L (ref 22–32)
Calcium: 8.5 mg/dL — ABNORMAL LOW (ref 8.9–10.3)
Creatinine, Ser: 1.62 mg/dL — ABNORMAL HIGH (ref 0.61–1.24)
GFR calc Af Amer: 50 mL/min — ABNORMAL LOW (ref >60)
GFR calc non Af Amer: 43 mL/min — ABNORMAL LOW (ref >60)
GLUCOSE: 99 mg/dL (ref 65–99)
POTASSIUM: 3.9 mmol/L (ref 3.5–5.1)
SODIUM: 139 mmol/L (ref 135–145)
TOTAL PROTEIN: 6.9 g/dL (ref 6.5–8.1)

## 2015-07-24 LAB — CSF CELL COUNT WITH DIFFERENTIAL
RBC Count, CSF: 6850 /mm3 — ABNORMAL HIGH
TUBE #: 3
WBC, CSF: 9 /mm3 — ABNORMAL HIGH (ref 0–5)

## 2015-07-24 LAB — CBC
HEMATOCRIT: 45.6 % (ref 39.0–52.0)
HEMOGLOBIN: 14.5 g/dL (ref 13.0–17.0)
MCH: 27.6 pg (ref 26.0–34.0)
MCHC: 31.8 g/dL (ref 30.0–36.0)
MCV: 86.7 fL (ref 78.0–100.0)
Platelets: 248 10*3/uL (ref 150–400)
RBC: 5.26 MIL/uL (ref 4.22–5.81)
RDW: 14.3 % (ref 11.5–15.5)
WBC: 10.5 10*3/uL (ref 4.0–10.5)

## 2015-07-24 LAB — GLUCOSE, CAPILLARY
GLUCOSE-CAPILLARY: 114 mg/dL — AB (ref 65–99)
GLUCOSE-CAPILLARY: 101 mg/dL — AB (ref 65–99)
GLUCOSE-CAPILLARY: 85 mg/dL (ref 65–99)
Glucose-Capillary: 90 mg/dL (ref 65–99)

## 2015-07-24 LAB — GLUCOSE, CSF: Glucose, CSF: 77 mg/dL — ABNORMAL HIGH (ref 40–70)

## 2015-07-24 LAB — PROTEIN, CSF: TOTAL PROTEIN, CSF: 45 mg/dL (ref 15–45)

## 2015-07-24 MED ORDER — DIPHENHYDRAMINE HCL 50 MG/ML IJ SOLN
12.5000 mg | Freq: Four times a day (QID) | INTRAMUSCULAR | Status: DC | PRN
  Administered 2015-07-25: 12.5 mg via INTRAVENOUS
  Filled 2015-07-24: qty 1

## 2015-07-24 MED ORDER — PROCHLORPERAZINE EDISYLATE 5 MG/ML IJ SOLN
10.0000 mg | Freq: Four times a day (QID) | INTRAMUSCULAR | Status: DC | PRN
  Administered 2015-07-25: 10 mg via INTRAVENOUS
  Filled 2015-07-24: qty 2

## 2015-07-24 NOTE — Progress Notes (Addendum)
Occupational Therapy Treatment Patient Details Name: Alexander Gonzalez MRN: [id number] DOB: 1950/11/16 Today's Date: 07/24/2015    History of present illness 65 y.o. male with a Past Medical History of HTN, OA, TIA, HLD who presents with HA and R sided weakness and abnormal basilar artery circulation. IR to perform arteriogram and possible intervention. Neuro and IR following.    OT comments  Pt limited by pain and vomiting. Updated d/c recommendation to SNF.  Follow Up Recommendations  SNF    Equipment Recommendations  Other (comment) (defer to next venue)    Recommendations for Other Services      Precautions / Restrictions Precautions Precautions: Fall Restrictions Weight Bearing Restrictions: No       Mobility Bed Mobility Overal bed mobility: Needs Assistance Bed Mobility: Supine to Sit           General bed mobility comments: Pt transferred to EOB with Min guard assist from daughter and daughter gave hand held assist as pt came to edge of bed.   Transfers Overall transfer level: Needs assistance   Transfers: Sit to/from Stand;Stand Pivot Transfers Sit to Stand: +2 physical assistance;From elevated surface;Mod assist Stand pivot transfers: +2 physical assistance;Mod assist       General transfer comment: RW in front of pt but he reached right hand to chair to stand and turn to chair. Pt did not stand fully upright with pivot transfer.    Balance    Transferred from bed to chair with +2 assist.                               ADL Overall ADL's : Needs assistance/impaired   Eating/Feeding Details (indicate cue type and reason): daughter was helping pt with breakfast when OT arrived Grooming: Sitting Grooming Details (indicate cue type and reason): daughter wiped off pt's face with washcloth; pt vomiting in session Upper Body Bathing: Sitting;Supervision/ safety;Set up Upper Body Bathing Details (indicate cue type and reason): washed armpits; OT  positioned gown out of the way Lower Body Bathing: Sitting/lateral leans;Set up;Supervison/ safety Lower Body Bathing Details (indicate cue type and reason): pt washed off upper legs and peri area Upper Body Dressing : Maximal assistance;Sitting   Lower Body Dressing: Maximal assistance;Sitting/lateral leans Lower Body Dressing Details (indicate cue type and reason): doffed shorts with pt in chair Toilet Transfer: Moderate assistance;+2 for physical assistance;Stand-pivot (RW in front of pt prior to stand; pt did not stand fully upright for pivot transfer)           Functional mobility during ADLs: Moderate assistance;+2 for physical assistance (pivot transfer and sit to stand transfer; RW in front of pt prior to stand from bed) General ADL Comments: Pt started vomiting in session. Stepped back in session after little while and assisted with bathing and doffing shorts      Vision                     Perception     Praxis      Cognition  lethargic Behavior During Therapy: [facility] for tasks assessed/performed Overall Cognitive Status:  (unsure of baseline-pt kept eyes closed most of session)                       Extremity/Trunk Assessment               Exercises     Shoulder Instructions  General Comments      Pertinent Vitals/ Pain       Pain Assessment: 0-10 Pain Score:  (9-10) Pain Location: head Pain Descriptors / Indicators: Headache Pain Intervention(s): Monitored during session;Limited activity within patient's tolerance;Repositioned;Other (comment) (notified nurse of pain )  Home Living                                          Prior Functioning/Environment              Frequency Min 2X/week     Progress Toward Goals  OT Goals(current goals can now be found in the care plan section)  Progress towards OT goals: Progressing toward goals-added a goal  Acute Rehab OT Goals Patient Stated Goal: not stated OT  Goal Formulation: With patient Time For Goal Achievement: 08/05/15 Potential to Achieve Goals: Good ADL Goals Pt Will Perform Lower Body Bathing: Independently Pt Will Perform Upper Body Dressing: with set-up;sitting Pt Will Perform Lower Body Dressing: Independently Pt Will Transfer to Toilet: Independently Pt Will Perform Toileting - Clothing Manipulation and hygiene: Independently Pt Will Perform Tub/Shower Transfer: Tub transfer;ambulating  Plan Discharge plan needs to be updated    Co-evaluation                 End of Session Equipment Utilized During Treatment: Gait belt;Rolling walker   Activity Tolerance Patient limited by pain   Patient Left in chair;with call bell/phone within reach;with chair alarm set;with family/visitor present   Nurse Communication Other (comment) (pain; pt's clothes and bed wet)        Time: [phone]; stepped back in room and worked with pt for approximately 5 more minutes and helped in doffing shorts/bathing [phone] OT Time Calculation (min): 19 min (added times above)  Charges: OT General Charges $OT Visit: 1 Procedure OT Treatments $Self Care/Home Management : 8-22 mins  Earlie RavelingStraub, Faten Frieson L OTR/L [phone] 07/24/2015, 9:49 AM

## 2015-07-24 NOTE — Progress Notes (Signed)
Subjective: Continues to complain of positional headache.    Exam: Filed Vitals:   07/24/15 0502 07/24/15 0928  BP: 169/74 126/70  Pulse: 80 81  Temp: 98 F (36.7 C) 98 F (36.7 C)  Resp: 16 20    Gen: In bed, appears uncomfortable.  MS: sleepy, but rousable, answer questions.  CN: PERRLA, EOMI, face symmetric vision intact Motor: MAEW  Sensory: intact throughout  Impression:  65 yo M with intractable headache, awaiting LP. There does appear to be a positional component to this headache, if no explanation on LP, will pursue further imaging to evaluate for possible CSF leak.  I am not sure if depakote or baclofen are helping, certainly could be contributing to sedation.   Recommendations: 1) Lumbar puncture as ordered, 2) Discontinue baclofen and depakote.  3) will continue to follow.    Ritta SlotMcNeill Jakiyah Stepney, MD [facility] [phone]  If 7pm- 7am, please page neurology on call as listed in AMION.  07/24/2015, 1:04 PM

## 2015-07-24 NOTE — Progress Notes (Signed)
[facility] PROGRESS NOTE  Anthon Harpole ZOX:[id number] DOB: 01-Dec-1950 DOA: 07/19/2015   PCP: Katy Apo, MD     Assessment/Plan: Principal Problem:   Severe headache Active Problems:   HTN (hypertension)   Hyperlipidemia   Abnormal computed tomography angiography of brain   CKD (chronic kidney disease), stage III   Acute hyperglycemia   Basilar artery stenosis   Hypertensive emergency   Occlusion and stenosis of basilar artery   Cephalalgia    Brief summary 65 y.o. male with a Past Medical History of HTN, OA, TIA, HLD who presents with HA and R sided weakness and abnormal basilar artery circulation. IR hat scheduled to perform arteriogram and possible intervention, this was canceled due to intractable headache and he recommended this to be done as an outpatient. Neuro and IR following   Assessment and plan Severe headache/ Abnormal computed tomography angiography of brain -Patient presented with severe headache transient right sided weakness (primarily hand) and elevated blood pressure and reported compliance with medications-imaging reveals basilar circulatory abnormalities-CT head without acute stroke and NIHSS was 0 Neurology interventional radiology consulted -IR recommended cerebral arteriogram with possibility of pursuing intracranial vascular intervention if indicated (concern over possible aneurysm), 4/21, however unable to be completed due to intractable headache Headache management per neurology MRI, MRV for severe headache was normal no intracranial abnormality, stable MRI since 2016 IR plans to do angiogram as outpatient  LP was attempted at bedside today by me to rule out any other causes for this HA such as SAH or infections, although CT brain, CTA head and neck, brain MRI w/wo, and MRV studies are all negative, no fevers or any other clinical signs or lab abnormalities to suspect infections. Bedside LP unsuccessful therefore the patient will  receive IR guided LP on 4/25 Neurology has discontinued Depakote and baclofen    HTN (hypertension), improved Continue Norvasc, hydralazine, hold Cozaar due to increasing creatinine, -Hydralazine prn 2-D echo results LV EF: 65% - 70%   CKD (chronic kidney disease), stage III, baseline 1.4 acute kidney injury, -Likely related to hypertension, prerenal Discontinue Cozaar, gentle hydration and follow    Hyperlipidemia -Continue preadmission Zocor   Acute hyperglycemia Hemoglobin A1c 6.1  Chest pain , noncardiac  Like esophageal spasma, EKG unchanged  Cardiac enzymes negative D-dimer negative   DVT prophylaxsis Lovenox  Code Status:  Full code    Code Status Orders       Family Communication: Discussed in detail with the patient/Daughter in the room, all imaging results, lab results explained to the patient   Disposition Plan:  Anticipate discharge in 2-3 days, Patient refusing to go home, states that nobody is going to take care of him at home, However not participating with therapist to determine if he needs SNF, PT finally recommended SNF,      Consultants:  Neurology  Interventional radiology  *  Procedures:  None  Antibiotics: Anti-infectives    None         HPI/Subjective: Continues to complain of positional headache.   Objective: Filed Vitals:   07/23/15 2132 07/24/15 0105 07/24/15 0502 07/24/15 0928  BP: 160/78 174/80 169/74 126/70  Pulse: 85 80 80 81  Temp: 98.2 F (36.8 C) 97.7 F (36.5 C) 98 F (36.7 C) 98 F (36.7 C)  TempSrc: Oral Oral Oral Oral  Resp: 16 16 16 20   Weight:      SpO2: 97% 94% 95% 94%    Intake/Output Summary (Last 24 hours) at 07/24/15  1318 Last data filed at 07/24/15 0430  Gross per 24 hour  Intake      0 ml  Output    700 ml  Net   -700 ml    Exam:  Examination:  General exam: Appears calm and comfortable  Respiratory system: Clear to auscultation. Respiratory effort  normal. Cardiovascular system: S1 & S2 heard, RRR. No JVD, murmurs, rubs, gallops or clicks. No pedal edema. Gastrointestinal system: Abdomen is nondistended, soft and nontender. No organomegaly or masses felt. Normal bowel sounds heard. Central nervous system: Alert and oriented. No focal neurological deficits. Extremities: Symmetric 5 x 5 power. Skin: No rashes, lesions or ulcers Psychiatry: Judgement and insight appear normal. Mood & affect appropriate.     Data Reviewed: I have personally reviewed following labs and imaging studies  Micro Results No results found for this or any previous visit (from the past 240 hour(s)).  Radiology Reports Ct Angio Head W/cm &/or Wo Cm  07/19/2015  CLINICAL DATA:  65 year old male code stroke. Severe headache and weakness. Right side weakness. Initial encounter. EXAM: CT ANGIOGRAPHY HEAD AND NECK TECHNIQUE: Multidetector CT imaging of the head and neck was performed using the standard protocol during bolus administration of intravenous contrast. Multiplanar CT image reconstructions and MIPs were obtained to evaluate the vascular anatomy. Carotid stenosis measurements (when applicable) are obtained utilizing NASCET criteria, using the distal internal carotid diameter as the denominator. CONTRAST:  80 mL Isovue 370. COMPARISON:  Head CT without contrast 1123 hours today. Brain MRI 01/14/2015. FINDINGS: CTA NECK Skeleton: Intermittent poor dentition. Degenerative changes in the cervical and visualized thoracic spine. No acute osseous abnormality identified. Mild to moderate paranasal sinus mucosal thickening with chronic periosteal thickening suggesting chronic sinus disease. Other neck: Mediastinal lipomatosis. No superior mediastinal lymphadenopathy. Negative visualized lung apices aside from atelectasis. Negative thyroid, larynx, pharynx, parapharyngeal spaces, retropharyngeal space, sublingual space, submandibular glands and parotid glands. No cervical  lymphadenopathy. Aortic arch: Bovine arch configuration. Minimal to mild calcified arch atherosclerosis. No great vessel origin stenosis. Right carotid system: Mildly tortuous right CCA. Bulky soft and to a lesser extent calcified plaque at the right carotid bifurcation resulting in right ICA origin stenosis of 65 % with respect to the distal vessel. Otherwise negative cervical right ICA. Left carotid system: Tortuous proximal left CCA. Bovine type left CCA origin. At the left carotid bifurcation there is circumferential soft plaque at the left ICA origin, but less than 50 % stenosis with respect to the distal vessel results (series 9, image 119). Otherwise negative cervical left ICA. Vertebral arteries: No proximal right subclavian artery stenosis. Soft plaque with mild stenosis at the right vertebral artery origin (series 9, image 141). Dominant right vertebral artery otherwise is widely patent to the skullbase. No proximal left subclavian artery stenosis. Non dominant left vertebral artery with a normal origin. Tortuous left V1 segment. No left vertebral artery stenosis to the skullbase. CTA HEAD Posterior circulation: Dominant right vertebral artery with V4 segment calcified plaque resulting an mild to moderate stenosis. Right PICA origin is patent. Non dominant left vertebral artery terminates in the left PICA. Calcified plaque at the basilar artery origin not resulting in stenosis, but there is widespread basilar artery irregularity related to soft atherosclerotic plaque such that a beaded appearance occurs throughout the basilar artery (series 12 images 27 and 28). No high-grade stenosis occurs. The basilar tip remains patent. SCA and MCA origins are patent, but there is moderate to severe stenosis at the left PCA origin. There is  additional moderate tandem left P2 segment stenosis. Bilateral distal PCA branches remain patent. Anterior circulation: Soft plaque in the left ICA at the skullbase resulting in up  to 50 % stenosis with respect to the distal vessel. Left ICA siphon is patent with mild cavernous and moderate supraclinoid segment calcified plaque not resulting in stenosis. Patent left ICA terminus. Normal left ophthalmic artery origin. Mild cavernous and moderate supra clinoid right ICA calcified plaque without stenosis. Normal right ophthalmic artery origin. Patent right ICA terminus. Right MCA and ACA origins are within normal limits. Fenestrated anterior communicating artery is present. Bilateral A2 and distal ACA branches are within normal limits. Right MCA M1 segment, bifurcation, and right MCA branches are within normal limits. Left ACA origin is highly stenotic such that there is minimal if any flow in the left A1 segment. Still, the left A2 is adequately supplied by the anterior communicating artery. Left MCA origin, M1 segment, bifurcation, and left MCA branches are within normal limits. Venous sinuses: Patent. Anatomic variants: Dominant right vertebral artery. Left vertebral artery terminates in PICA. Bovine type arch configuration. IMPRESSION: 1. Negative for emergent large vessel occlusion. 2. Bulky soft plaque at the right carotid bifurcation resulting in 65% stenosis of the right ICA origin. 3. Severe intracranial basilar artery atherosclerosis, resulting in a beaded appearance of the vessel throughout its course, but no hemodynamically significant basilar artery stenosis. There is superimposed mild to moderate stenosis of the right vertebral artery V4 segment which supplies the basilar. The non dominant left vertebral artery terminates in the left PICA. 4. Severe stenosis at the left PCA origin with preserved distal flow. 5. Severe stenosis at the left ACA origin with adequately reconstituted A2 segment flow via the anterior communicating artery. 6. Other intracranial and extracranial atherosclerosis is not hemodynamically significant. Electronically Signed   By: Odessa Fleming M.D.   On: 07/19/2015  12:51   Dg Chest 2 View  07/20/2015  CLINICAL DATA:  Dry cough and severe headache yesterday, history hypertension EXAM: CHEST  2 VIEW COMPARISON:  None FINDINGS: Lordotic positioning with slight rotation to the RIGHT on AP view. Normal heart size, mediastinal contours and pulmonary vascularity. Elevation of RIGHT diaphragm with RIGHT basilar atelectasis. Lungs otherwise clear. No pleural effusion or pneumothorax. IMPRESSION: RIGHT basilar atelectasis. Electronically Signed   By: Ulyses Southward M.D.   On: 07/20/2015 11:16   Ct Angio Neck W/cm &/or Wo/cm  07/19/2015  CLINICAL DATA:  65 year old male code stroke. Severe headache and weakness. Right side weakness. Initial encounter. EXAM: CT ANGIOGRAPHY HEAD AND NECK TECHNIQUE: Multidetector CT imaging of the head and neck was performed using the standard protocol during bolus administration of intravenous contrast. Multiplanar CT image reconstructions and MIPs were obtained to evaluate the vascular anatomy. Carotid stenosis measurements (when applicable) are obtained utilizing NASCET criteria, using the distal internal carotid diameter as the denominator. CONTRAST:  80 mL Isovue 370. COMPARISON:  Head CT without contrast 1123 hours today. Brain MRI 01/14/2015. FINDINGS: CTA NECK Skeleton: Intermittent poor dentition. Degenerative changes in the cervical and visualized thoracic spine. No acute osseous abnormality identified. Mild to moderate paranasal sinus mucosal thickening with chronic periosteal thickening suggesting chronic sinus disease. Other neck: Mediastinal lipomatosis. No superior mediastinal lymphadenopathy. Negative visualized lung apices aside from atelectasis. Negative thyroid, larynx, pharynx, parapharyngeal spaces, retropharyngeal space, sublingual space, submandibular glands and parotid glands. No cervical lymphadenopathy. Aortic arch: Bovine arch configuration. Minimal to mild calcified arch atherosclerosis. No great vessel origin stenosis. Right  carotid system: Mildly tortuous  right CCA. Bulky soft and to a lesser extent calcified plaque at the right carotid bifurcation resulting in right ICA origin stenosis of 65 % with respect to the distal vessel. Otherwise negative cervical right ICA. Left carotid system: Tortuous proximal left CCA. Bovine type left CCA origin. At the left carotid bifurcation there is circumferential soft plaque at the left ICA origin, but less than 50 % stenosis with respect to the distal vessel results (series 9, image 119). Otherwise negative cervical left ICA. Vertebral arteries: No proximal right subclavian artery stenosis. Soft plaque with mild stenosis at the right vertebral artery origin (series 9, image 141). Dominant right vertebral artery otherwise is widely patent to the skullbase. No proximal left subclavian artery stenosis. Non dominant left vertebral artery with a normal origin. Tortuous left V1 segment. No left vertebral artery stenosis to the skullbase. CTA HEAD Posterior circulation: Dominant right vertebral artery with V4 segment calcified plaque resulting an mild to moderate stenosis. Right PICA origin is patent. Non dominant left vertebral artery terminates in the left PICA. Calcified plaque at the basilar artery origin not resulting in stenosis, but there is widespread basilar artery irregularity related to soft atherosclerotic plaque such that a beaded appearance occurs throughout the basilar artery (series 12 images 27 and 28). No high-grade stenosis occurs. The basilar tip remains patent. SCA and MCA origins are patent, but there is moderate to severe stenosis at the left PCA origin. There is additional moderate tandem left P2 segment stenosis. Bilateral distal PCA branches remain patent. Anterior circulation: Soft plaque in the left ICA at the skullbase resulting in up to 50 % stenosis with respect to the distal vessel. Left ICA siphon is patent with mild cavernous and moderate supraclinoid segment calcified  plaque not resulting in stenosis. Patent left ICA terminus. Normal left ophthalmic artery origin. Mild cavernous and moderate supra clinoid right ICA calcified plaque without stenosis. Normal right ophthalmic artery origin. Patent right ICA terminus. Right MCA and ACA origins are within normal limits. Fenestrated anterior communicating artery is present. Bilateral A2 and distal ACA branches are within normal limits. Right MCA M1 segment, bifurcation, and right MCA branches are within normal limits. Left ACA origin is highly stenotic such that there is minimal if any flow in the left A1 segment. Still, the left A2 is adequately supplied by the anterior communicating artery. Left MCA origin, M1 segment, bifurcation, and left MCA branches are within normal limits. Venous sinuses: Patent. Anatomic variants: Dominant right vertebral artery. Left vertebral artery terminates in PICA. Bovine type arch configuration. IMPRESSION: 1. Negative for emergent large vessel occlusion. 2. Bulky soft plaque at the right carotid bifurcation resulting in 65% stenosis of the right ICA origin. 3. Severe intracranial basilar artery atherosclerosis, resulting in a beaded appearance of the vessel throughout its course, but no hemodynamically significant basilar artery stenosis. There is superimposed mild to moderate stenosis of the right vertebral artery V4 segment which supplies the basilar. The non dominant left vertebral artery terminates in the left PICA. 4. Severe stenosis at the left PCA origin with preserved distal flow. 5. Severe stenosis at the left ACA origin with adequately reconstituted A2 segment flow via the anterior communicating artery. 6. Other intracranial and extracranial atherosclerosis is not hemodynamically significant. Electronically Signed   By: Odessa Fleming M.D.   On: 07/19/2015 12:51   Mr Laqueta Jean ZO Contrast  07/21/2015  CLINICAL DATA:  65 year old male with headache and right side weakness. Initial encounter. EXAM:  MRI HEAD WITHOUT AND WITH  CONTRAST MRV HEAD WITHOUT CONTRAST TECHNIQUE: Multiplanar, multiecho pulse sequences of the brain and surrounding structures were obtained without and with intravenous contrast. Angiographic images of the head were obtained using MRV technique without contrast. CONTRAST:  20 mL MultiHance COMPARISON:  CTA head and neck 07/19/2015.  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 No restricted diffusion or evidence of acute infarction. No midline shift, mass effect, evidence of mass lesion, ventriculomegaly, extra-axial collection or acute intracranial hemorrhage. Cervicomedullary junction and pituitary are within normal limits. Stable mild for age nonspecific cerebral white matter T2 and FLAIR hyperintensity, slightly greater in the left hemisphere. No cortical encephalomalacia or chronic cerebral blood products. Deep gray matter nuclei, brainstem, and cerebellum are normal for age. No abnormal enhancement identified. No dural thickening. Visible internal auditory structures appear normal. Mastoids are clear. Mild paranasal sinus mucosal thickening. Negative orbit and scalp soft tissues. Negative visualized cervical spine. Normal bone marrow signal. MRV HEAD FINDINGS Preserved flow signal in the superior sagittal sinus, the torcula, both transverse sinuses (the left is dominant) both sigmoid sinuses, and both IJ bulbs. Preserved flow signal in the straight sinus, vein of Galen, internal cerebral veins, and basal veins of Rosenthal. Flow signal in the major cortical veins appears preserved including the Trolard and veins of Labbe. IMPRESSION: 1. Normal intracranial MRV. 2. No acute intracranial abnormality. Stable MRI appearance of the brain since 2016. 3. MRI images demonstrate that  the left vertebral artery rather than anatomically terminating in the PICA, rather is chronically occluded beyond the left PICA. Electronically Signed   By: Odessa FlemingH  Hall M.D.   On: 07/21/2015 12:02   Mr Mrv Head Wo Cm  07/21/2015  CLINICAL DATA:  [id number] year old male with headache and right side weakness. Initial encounter. EXAM: MRI HEAD WITHOUT AND WITH CONTRAST MRV HEAD WITHOUT CONTRAST TECHNIQUE: Multiplanar, multiecho pulse sequences of the brain and surrounding structures were obtained without and with intravenous contrast. Angiographic images of the head were obtained using MRV technique without contrast. CONTRAST:  20 mL MultiHance COMPARISON:  CTA head and neck 07/19/2015.  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 No restricted diffusion or evidence of acute infarction. No midline shift, mass effect, evidence of mass lesion, ventriculomegaly, extra-axial collection or acute intracranial hemorrhage. Cervicomedullary junction and pituitary are within normal limits. Stable mild for age nonspecific cerebral white matter T2 and FLAIR hyperintensity, slightly greater in the left hemisphere. No cortical encephalomalacia or chronic cerebral blood products. Deep gray matter nuclei, brainstem, and cerebellum are normal for age. No abnormal enhancement identified. No dural thickening. Visible internal auditory structures appear normal. Mastoids are clear. Mild paranasal sinus mucosal thickening. Negative orbit and scalp soft tissues. Negative visualized cervical spine. Normal bone marrow signal. MRV HEAD FINDINGS Preserved flow signal in the superior sagittal sinus, the torcula, both transverse sinuses (the left is dominant) both sigmoid sinuses, and both IJ bulbs. Preserved flow signal in the straight sinus,  vein of Galen, internal cerebral veins, and basal veins of Rosenthal. Flow signal in the major cortical veins appears preserved including the [place]rol[place] and veins of Labbe. IMPRESSION: 1. Normal intracranial MRV. 2. No acute intracranial abnormality. Stable  MRI appearance of the brain since 2016. 3. 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 Electronically Signed   By: Odessa Fleming M.D.   On: 07/21/2015 12:02   Ct Head Code Stroke W/o Cm  07/19/2015  CLINICAL DATA:  Severe headache and right-sided weakness today. EXAM: CT HEAD WITHOUT CONTRAST TECHNIQUE: Contiguous axial images were obtained from the base of the skull through the vertex without intravenous contrast. COMPARISON:  Head CT 01/14/2015 and MRI brain 01/14/2015 FINDINGS: Stable age related cerebral atrophy, ventriculomegaly and periventricular white matter disease. No findings for acute hemispheric infarction an or intracranial hemorrhage. No extra-axial fluid collections are identified. No mass lesions. The brainstem and cerebellum are grossly normal in stable. Vascular calcifications are noted. No acute bony findings. Extensive mucoperiosteal thickening involving the maxillary and ethmoid sinuses bilaterally. The mastoid air cells and middle ear cavities are clear. The globes are intact. IMPRESSION: No acute intracranial findings. Specifically, no findings for hemispheric infarction or intracranial hemorrhage. Stable age related cerebral atrophy and mild ventriculomegaly. Electronically Signed   By: Rudie Meyer M.D.   On: 07/19/2015 11:29     CBC  Recent Labs Lab 07/19/15 1114  07/20/15 0605 07/21/15 0209 07/22/15 0520 07/23/15 0424 07/24/15 0420  WBC 5.9  --  11.5* 15.4* 15.5* 12.5* 10.5  HGB 13.7  < > 14.2 13.9 14.7 15.2 14.5  HCT 42.2  < > 43.4 43.5 46.7 47.2 45.6  PLT 177  --  218 227 246 263 248  MCV 85.9  --  85.4 88.4  87.8 87.6 86.7  MCH 27.9  --  28.0 28.3 27.6 28.2 27.6  MCHC 32.5  --  32.7 32.0 31.5 32.2 31.8  RDW 13.6  --  13.5 13.9 14.0 14.4 14.3  LYMPHSABS 2.1  --   --   --   --   --   --   MONOABS 0.4  --   --   --   --   --   --   EOSABS 0.3  --   --   --   --   --   --   BASOSABS 0.0  --   --   --   --   --   --   < > = values in this interval not displayed.  Chemistries   Recent Labs Lab 07/19/15 1114  07/20/15 0605 07/21/15 0209 07/22/15 0520 07/23/15 0424 07/24/15 0420  NA 140  < > 136 138 137 138 139  K 4.5  < > 4.4 4.8 4.6 4.1 3.9  CL 105  < > 101 103 101 103 104  CO2 22  --  GLUCOSE 157*  < > 147* 133* 138* 135* 99  BUN 14  < > 20 24* 37* 47* 46*  CREATININE 1.44*  < > 1.27* 1.33* 1.53* 1.80* 1.62*  CALCIUM 9.2  --  9.2 9.1 9.2 8.9 8.5*  AST 26  --   --  ALT 21  --   --  17 18 16* 13*  ALKPHOS 59  --   --  54 52 45 40  BILITOT 0.9  --   --  0.5 0.8 0.9 1.0  < > = values in this interval not displayed. ------------------------------------------------------------------------------------------------------------------ CrCl cannot be calculated (Unknown ideal weight.). ------------------------------------------------------------------------------------------------------------------ No results for input(s): HGBA1C in the last 72 hours. ------------------------------------------------------------------------------------------------------------------  No results for input(s): CHOL, HDL, LDLCALC, TRIG, CHOLHDL, LDLDIRECT in the last 72 hours. ------------------------------------------------------------------------------------------------------------------ No results for input(s): TSH, T4TOTAL, T3FREE, THYROIDAB in the last 72 hours.  Invalid input(s): FREET3 ------------------------------------------------------------------------------------------------------------------ No results for input(s): VITAMINB12, FOLATE, FERRITIN, TIBC, IRON, RETICCTPCT in  the last 72 hours.  Coagulation profile  Recent Labs Lab 07/19/15 1114  INR 1.10    No results for input(s): DDIMER in the last 72 hours.  Cardiac Enzymes  Recent Labs Lab 07/20/15 1211  TROPONINI <0.03   ------------------------------------------------------------------------------------------------------------------ Invalid input(s): POCBNP   CBG:  Recent Labs Lab 07/23/15 0609 07/23/15 1112 07/23/15 1623 07/23/15 2135 07/24/15 0631  GLUCAP 130* 163* 106* 111* 90       Studies: No results found.    Lab Results  Component Value Date   HGBA1C 6.1* 07/20/2015   HGBA1C 5.9* 01/15/2015   Lab Results  Component Value Date   LDLCALC 122* 07/20/2015   CREATININE 1.62* 07/24/2015       Scheduled Meds: . amLODipine  10 mg Oral Daily  . aspirin EC  81 mg Oral Daily  . clonazePAM  0.5 mg Oral BID  . enoxaparin (LOVENOX) injection  40 mg Subcutaneous Q24H  . hydrALAZINE  100 mg Oral Q6H  . pantoprazole  40 mg Oral Daily  . simvastatin  20 mg Oral QPM   Continuous Infusions: . sodium chloride 75 mL/hr at 07/23/15 2043     LOS: 4 days     Time spent: >30 MINS    [facility]  [facility] Pager [phone]. If 7PM-7AM, please contact night-coverage at [email or web address], password [facility] 07/24/2015, 1:18 PM  LOS: 4 days

## 2015-07-24 NOTE — Procedures (Signed)
EXAM: DIAGNOSTIC LUMBAR PUNCTURE UNDER FLUOROSCOPIC GUIDANCE FLUOROSCOPY TIME:  If the device does not provide the exposure index: Fluoroscopy Time (in minutes and seconds): [1 minutes and 24 seconds] PROCEDURE: Informed consent was obtained from the patient's wife prior to the procedure, including potential complications of headache, allergy, and pain. With the patient prone, the lower back was prepped with Betadine. 1% Lidocaine was used for local anesthesia. Lumbar puncture was performed at the [L3-4] level using a [20] gauge needle with return of [pink/sanguinous ] CSF with an opening pressure of [33] cm water. [6.5] ml of CSF were obtained for laboratory studies. The patient tolerated the procedure well and there were no apparent complications. IMPRESSION: [Uncomplicated lumbar puncture, as above.]

## 2015-07-25 LAB — COMPREHENSIVE METABOLIC PANEL
ALT: 15 U/L — ABNORMAL LOW (ref 17–63)
AST: 17 U/L (ref 15–41)
Albumin: 3.3 g/dL — ABNORMAL LOW (ref 3.5–5.0)
Alkaline Phosphatase: 34 U/L — ABNORMAL LOW (ref 38–126)
Anion gap: 9 (ref 5–15)
BUN: 29 mg/dL — ABNORMAL HIGH (ref 6–20)
CHLORIDE: 103 mmol/L (ref 101–111)
CO2: 26 mmol/L (ref 22–32)
CREATININE: 1.29 mg/dL — AB (ref 0.61–1.24)
Calcium: 8.4 mg/dL — ABNORMAL LOW (ref 8.9–10.3)
GFR, EST NON AFRICAN AMERICAN: 57 mL/min — AB (ref >60)
Glucose, Bld: 98 mg/dL (ref 65–99)
POTASSIUM: 3.8 mmol/L (ref 3.5–5.1)
Sodium: 138 mmol/L (ref 135–145)
Total Bilirubin: 1.3 mg/dL — ABNORMAL HIGH (ref 0.3–1.2)
Total Protein: 6.4 g/dL — ABNORMAL LOW (ref 6.5–8.1)

## 2015-07-25 LAB — GLUCOSE, CAPILLARY
GLUCOSE-CAPILLARY: 84 mg/dL (ref 65–99)
GLUCOSE-CAPILLARY: 90 mg/dL (ref 65–99)
Glucose-Capillary: 109 mg/dL — ABNORMAL HIGH (ref 65–99)
Glucose-Capillary: 95 mg/dL (ref 65–99)

## 2015-07-25 LAB — SEDIMENTATION RATE: SED RATE: 7 mm/h (ref 0–16)

## 2015-07-25 LAB — VDRL, CSF: VDRL Quant, CSF: NONREACTIVE

## 2015-07-25 LAB — C-REACTIVE PROTEIN: CRP: 0.7 mg/dL (ref <1.0)

## 2015-07-25 MED ORDER — DIPHENHYDRAMINE HCL 50 MG/ML IJ SOLN
12.5000 mg | Freq: Four times a day (QID) | INTRAMUSCULAR | Status: DC
  Administered 2015-07-25 – 2015-07-26 (×3): 12.5 mg via INTRAVENOUS
  Filled 2015-07-25 (×4): qty 1

## 2015-07-25 MED ORDER — PROCHLORPERAZINE EDISYLATE 5 MG/ML IJ SOLN
10.0000 mg | Freq: Four times a day (QID) | INTRAMUSCULAR | Status: DC
  Administered 2015-07-25 – 2015-07-26 (×3): 10 mg via INTRAVENOUS
  Filled 2015-07-25 (×4): qty 2

## 2015-07-25 MED ORDER — KETOROLAC TROMETHAMINE 30 MG/ML IJ SOLN
30.0000 mg | Freq: Once | INTRAMUSCULAR | Status: AC
  Administered 2015-07-25: 30 mg via INTRAVENOUS
  Filled 2015-07-25: qty 1

## 2015-07-25 NOTE — Care Management Important Message (Signed)
Important Message  Patient Details  Name: Alexander Gonzalez MRN: [id number] Date of Birth: 02/24/1951   Medicare Important Message Given:  Yes    Kermit BaloKelli F Shalaine Payson, RN 07/25/2015, 1:57 PM

## 2015-07-25 NOTE — Progress Notes (Signed)
On-call MD paged. Patient is to continue cardiac monitoring. RN placed order and will continue to monitor.

## 2015-07-25 NOTE — Progress Notes (Signed)
Physical Therapy Treatment Patient Details Name: Alexander Gonzalez MRN: [id number] DOB: 04/25/1950 Today's Date: 07/25/2015    History of Present Illness 65 y.o. male with a Past Medical History of HTN, OA, TIA, HLD who presents with HA and R sided weakness and abnormal basilar artery circulation. IR to perform arteriogram and possible intervention. Neuro and IR following.     PT Comments    Patient progressing with ability to ambulate in the room with less assist needed.  Feel may be able to go home if wife able to assist and can stay on main level with hospital bed, but still will benefit from SNF level rehab as wife with young children and may not be able to assist.  Will continue to follow.  Follow Up Recommendations  SNF     Equipment Recommendations  Rolling walker with 5" wheels;Hospital bed (if goes home)    Recommendations for Other Services       Precautions / Restrictions Precautions Precautions: Fall    Mobility  Bed Mobility Overal bed mobility: Needs Assistance Bed Mobility: Supine to Sit     Supine to sit: Mod assist;HOB elevated     General bed mobility comments: assist to lift trunk  Transfers Overall transfer level: Needs assistance Equipment used: Rolling walker (2 wheeled)   Sit to Stand: +2 physical assistance;Min assist         General transfer comment: up from EOB with assist of 2 elevated bed height  Ambulation/Gait Ambulation/Gait assistance: Mod assist;Min assist Ambulation Distance (Feet): 40 Feet Assistive device: Rolling walker (2 wheeled) Gait Pattern/deviations: Step-to pattern;Decreased stride length;Trunk flexed;Shuffle     General Gait Details: flexed due to walker too short; adjusted after ambulation; stayed in room to avoid bright lights of hallway.  assist to maneuver walker around furniture in the room   Stairs            Wheelchair Mobility    Modified Rankin (Stroke Patients Only)       Balance Overall  balance assessment: Needs assistance         Standing balance support: Bilateral upper extremity supported Standing balance-Leahy Scale: Poor Standing balance comment: UE support for standing with flexed posture                    Cognition Arousal/Alertness: Lethargic Behavior During Therapy: WFL for tasks assessed/performed Overall Cognitive Status: Difficult to assess                      Exercises General Exercises - Lower Extremity Heel Slides: AAROM;Both;5 reps;Supine    General Comments General comments (skin integrity, edema, etc.): spoke with son in room about possibility of getting hospital bed on main level of home, pt reports not enough room (would have to ask his wife)      Pertinent Vitals/Pain Pain Score: 5  Pain Location: head Pain Descriptors / Indicators: Headache Pain Intervention(s): Repositioned;Monitored during session    Home Living                      Prior Function            PT Goals (current goals can now be found in the care plan section) Progress towards PT goals: Progressing toward goals    Frequency  Min 3X/week    PT Plan Current plan remains appropriate    Co-evaluation             End of Session Equipment  Utilized During Treatment: Gait belt Activity Tolerance: Patient limited by fatigue Patient left: in chair;with family/visitor present;with chair alarm set;with call bell/phone within reach     Time: 1548-1610 PT Time Calculation (min) (ACUTE ONLY): 22 min  Charges:  $Gait Training: 8-22 mins                    G Codes:      [name] 07/27/15, 5:18 PM  Sheran Lawless, PT [phone] 27-Jul-2015

## 2015-07-25 NOTE — Progress Notes (Signed)
SLP Cancellation Note  Patient Details Name: Alexander Gonzalez MRN: [id number] DOB: 01/05/1951   Cancelled treatment:        Treatment cancelled. Pt received sedating/pain medication and patient sleeping, unable to wake.   Lindalou HoseSarah J. Izabelle Daus, MA, CCC-SLP 07/25/2015 2:31 PM

## 2015-07-25 NOTE — Clinical Social Work Note (Signed)
Clinical Social Work Assessment  Patient Details  Name: Alexander Gonzalez MRN: [id number] Date of Birth: 06/12/1950  Date of referral:  07/25/15               Reason for consult:  Facility Placement, Discharge Planning                Permission sought to share information with:  Facility Contact Representative, Family Supports Permission granted to share information::  Yes, Verbal Permission Granted  Name::     Milon and Gwendalyn  Agency::  SNFs  Relationship::     Contact Information:     Housing/Transportation Living arrangements for the past 2 months:  Single Family Home Source of Information:  Adult Children Patient Interpreter Needed:  None Criminal Activity/Legal Involvement Pertinent to Current Situation/Hospitalization:  No - Comment as needed Significant Relationships:  Adult Children, Spouse Lives with:  Spouse Do you feel safe going back to the place where you live?  Yes Need for family participation in patient care:  Yes (Comment)  Care giving concerns:  Neither patient or son express care giving concerns at this time. Patient's son agrees with the recommendation for SNF placement at discharge.   Social Worker assessment / plan:  CSW met with the patient and patient's son at bedside to complete assessment. The patient was sleeping at time of assessment. The patient's son states that the family agrees with the recommendation for SNF placement, but he shares that the patient may be opposed to it. The son shared that the patient will have to go because he cannot be managed at home in his current state. CSW explained SNF search/placement process and answered the son's questions. CSW will followup with available bed offers tomorrow.   Employment status:  Retired Insurance information:  Medicare PT Recommendations:  Skilled Nursing Facility Information / Referral to community resources:  Skilled Nursing Facility  Patient/Family's Response to care:  The patient's family appear  happy with the care the patient has received.  Patient/Family's Understanding of and Emotional Response to Diagnosis, Current Treatment, and Prognosis:  The patient's family appears to have a good understanding of reason for DC. Family seems to understand that the patient needs continued rehab at discharge.   Emotional Assessment Appearance:  Appears stated age Attitude/Demeanor/Rapport:  Unable to Assess (Patient sleeping at time of visit. Patient has had a "bad headache".) Affect (typically observed):  Unable to Assess Orientation:  Oriented to Self, Oriented to Place, Oriented to  Time, Oriented to Situation Alcohol / Substance use:  Not Applicable Psych involvement (Current and /or in the community):  No (Comment)  Discharge Needs  Concerns to be addressed:  Discharge Planning Concerns Readmission within the last 30 days:  No Current discharge risk:  Physical Impairment Barriers to Discharge:  Continued Medical Work up   Bryant Campbell MSW, LCSW, LCASA, [phone] 

## 2015-07-25 NOTE — Progress Notes (Signed)
[facility] PROGRESS NOTE  Alexander Gonzalez KGY:[id number] DOB: Aug 08, 1950 DOA: 07/19/2015   PCP: Kandice Hams, MD     Assessment/Plan: Principal Problem:   Severe headache Active Problems:   HTN (hypertension)   Hyperlipidemia   Abnormal computed tomography angiography of brain   CKD (chronic kidney disease), stage III   Acute hyperglycemia   Basilar artery stenosis   Hypertensive emergency   Occlusion and stenosis of basilar artery   Cephalalgia    Brief summary 65 y.o. male with a Past Medical History of HTN, OA, TIA, HLD who presents with HA and R sided weakness and abnormal basilar artery circulation. IR had scheduled to perform arteriogram with possible intervention, this was canceled due to intractable headache and he recommended this to be done as an outpatient. Neuro and IR following   Assessment and plan Severe headache/ Abnormal computed tomography angiography of brain -Patient presented with severe headache transient right sided weakness (primarily hand) and elevated blood pressure and reported compliance with medications-imaging reveals basilar circulatory abnormalities-CT head without acute stroke and NIHSS was 0 Neurology and interventional radiology consulted -IR recommended cerebral arteriogram with possibility of pursuing intracranial vascular intervention if indicated (concern over possible aneurysm), 4/21, however unable to be completed due to intractable headache Headache management per neurology MRI, MRV for severe headache was normal no intracranial abnormality, stable MRI since 2016 IR plans to do angiogram as outpatient  LP was attempted at bedside today by me to rule out any other causes for this HA such as SAH or infections, although CT brain, CTA head and neck, brain MRI w/wo, and MRV studies are all negative, no fevers or any other clinical signs or lab abnormalities to suspect infections. Bedside LP unsuccessful therefore the patient  will receive IR guided LP on 4/25 Neurology has discontinued Depakote and baclofen CSF analysis shows mildly elevated glucose, follow CSF fluid culture, fungal culture, VDRL, Check ESR, CRP, Lyme titers   HTN (hypertension), improved Continue Norvasc, hydralazine, hold Cozaar due to increasing creatinine, -Hydralazine prn 2-D echo results LV EF: 65% - 70%   CKD (chronic kidney disease), stage III, baseline 1.4 acute kidney injury, -Likely related to hypertension, prerenal Discontinue Cozaar, gentle hydration and follow    Hyperlipidemia -Continue preadmission Zocor   Acute hyperglycemia Hemoglobin A1c 6.1  Chest pain , noncardiac  Like esophageal spasma, EKG unchanged  Cardiac enzymes negative D-dimer negative   DVT prophylaxsis Lovenox  Code Status:  Full code    Code Status Orders       Family Communication: Discussed in detail with the patient/Daughter in the room, all imaging results, lab results explained to the patient   Disposition Plan:  Anticipate discharge in 2-3 days, Patient refusing to go home, states that nobody is going to take care of him at home, However not participating with therapist to determine if he needs SNF, PT finally recommended SNF,      Consultants:  Neurology  Interventional radiology  *  Procedures:  None  Antibiotics: Anti-infectives    None         HPI/Subjective: Continues to complain of headache and photophobia   Objective: Filed Vitals:   07/24/15 1752 07/24/15 2133 07/25/15 0113 07/25/15 0528  BP: 150/76 179/79 171/80 176/82  Pulse: 81 79 80 78  Temp: 97.6 F (36.4 C) 99 F (37.2 C) 98.9 F (37.2 C) 98.7 F (37.1 C)  TempSrc: Oral Oral Oral Oral  Resp: _0 Weight:  SpO2: 96% 96% 97% 96%    Intake/Output Summary (Last 24 hours) at 07/25/15 9381 Last data filed at 07/25/15 0115  Gross per 24 hour  Intake      0 ml  Output    650 ml  Net   -650 ml     Exam:  Examination:  General exam: Appears calm and comfortable  Respiratory system: Clear to auscultation. Respiratory effort normal. Cardiovascular system: S1 & S2 heard, RRR. No JVD, murmurs, rubs, gallops or clicks. No pedal edema. Gastrointestinal system: Abdomen is nondistended, soft and nontender. No organomegaly or masses felt. Normal bowel sounds heard. Central nervous system: Alert and oriented. No focal neurological deficits. Extremities: Symmetric 5 x 5 power. Skin: No rashes, lesions or ulcers Psychiatry: Judgement and insight appear normal. Mood & affect appropriate.     Data Reviewed: I have personally reviewed following labs and imaging studies  Micro Results Recent Results (from the past 240 hour(s))  CSF culture     Status: None (Preliminary result)   Collection Time: 07/24/15  2:02 PM  Result Value Ref Range Status   Specimen Description CSF  Final   Special Requests NONE  Final   Gram Stain NO WBC SEEN NO ORGANISMS SEEN   Final   Culture PENDING  Incomplete   Report Status PENDING  Incomplete    Radiology Reports Ct Angio Head W/cm &/or Wo Cm  07/19/2015  CLINICAL DATA:  65 year old male code stroke. Severe headache and weakness. Right side weakness. Initial encounter. EXAM: CT ANGIOGRAPHY HEAD AND NECK TECHNIQUE: Multidetector CT imaging of the head and neck was performed using the standard protocol during bolus administration of intravenous contrast. Multiplanar CT image reconstructions and MIPs were obtained to evaluate the vascular anatomy. Carotid stenosis measurements (when applicable) are obtained utilizing NASCET criteria, using the distal internal carotid diameter as the denominator. CONTRAST:  80 mL Isovue 370. COMPARISON:  Head CT without contrast 1123 hours today. Brain MRI 01/14/2015. FINDINGS: CTA NECK Skeleton: Intermittent poor dentition. Degenerative changes in the cervical and visualized thoracic spine. No acute osseous abnormality  identified. Mild to moderate paranasal sinus mucosal thickening with chronic periosteal thickening suggesting chronic sinus disease. Other neck: Mediastinal lipomatosis. No superior mediastinal lymphadenopathy. Negative visualized lung apices aside from atelectasis. Negative thyroid, larynx, pharynx, parapharyngeal spaces, retropharyngeal space, sublingual space, submandibular glands and parotid glands. No cervical lymphadenopathy. Aortic arch: Bovine arch configuration. Minimal to mild calcified arch atherosclerosis. No great vessel origin stenosis. Right carotid system: Mildly tortuous right CCA. Bulky soft and to a lesser extent calcified plaque at the right carotid bifurcation resulting in right ICA origin stenosis of 65 % with respect to the distal vessel. Otherwise negative cervical right ICA. Left carotid system: Tortuous proximal left CCA. Bovine type left CCA origin. At the left carotid bifurcation there is circumferential soft plaque at the left ICA origin, but less than 50 % stenosis with respect to the distal vessel results (series 9, image 119). Otherwise negative cervical left ICA. Vertebral arteries: No proximal right subclavian artery stenosis. Soft plaque with mild stenosis at the right vertebral artery origin (series 9, image 141). Dominant right vertebral artery otherwise is widely patent to the skullbase. No proximal left subclavian artery stenosis. Non dominant left vertebral artery with a normal origin. Tortuous left V1 segment. No left vertebral artery stenosis to the skullbase. CTA HEAD Posterior circulation: Dominant right vertebral artery with V4 segment calcified plaque resulting an mild to moderate stenosis. Right PICA origin is patent. Non dominant left  vertebral artery terminates in the left PICA. Calcified plaque at the basilar artery origin not resulting in stenosis, but there is widespread basilar artery irregularity related to soft atherosclerotic plaque such that a beaded  appearance occurs throughout the basilar artery (series 12 images 27 and 28). No high-grade stenosis occurs. The basilar tip remains patent. SCA and MCA origins are patent, but there is moderate to severe stenosis at the left PCA origin. There is additional moderate tandem left P2 segment stenosis. Bilateral distal PCA branches remain patent. Anterior circulation: Soft plaque in the left ICA at the skullbase resulting in up to 50 % stenosis with respect to the distal vessel. Left ICA siphon is patent with mild cavernous and moderate supraclinoid segment calcified plaque not resulting in stenosis. Patent left ICA terminus. Normal left ophthalmic artery origin. Mild cavernous and moderate supra clinoid right ICA calcified plaque without stenosis. Normal right ophthalmic artery origin. Patent right ICA terminus. Right MCA and ACA origins are within normal limits. Fenestrated anterior communicating artery is present. Bilateral A2 and distal ACA branches are within normal limits. Right MCA M1 segment, bifurcation, and right MCA branches are within normal limits. Left ACA origin is highly stenotic such that there is minimal if any flow in the left A1 segment. Still, the left A2 is adequately supplied by the anterior communicating artery. Left MCA origin, M1 segment, bifurcation, and left MCA branches are within normal limits. Venous sinuses: Patent. Anatomic variants: Dominant right vertebral artery. Left vertebral artery terminates in PICA. Bovine type arch configuration. IMPRESSION: 1. Negative for emergent large vessel occlusion. 2. Bulky soft plaque at the right carotid bifurcation resulting in 65% stenosis of the right ICA origin. 3. Severe intracranial basilar artery atherosclerosis, resulting in a beaded appearance of the vessel throughout its course, but no hemodynamically significant basilar artery stenosis. There is superimposed mild to moderate stenosis of the right vertebral artery V4 segment which supplies  the basilar. The non dominant left vertebral artery terminates in the left PICA. 4. Severe stenosis at the left PCA origin with preserved distal flow. 5. Severe stenosis at the left ACA origin with adequately reconstituted A2 segment flow via the anterior communicating artery. 6. Other intracranial and extracranial atherosclerosis is not hemodynamically significant. Electronically Signed   By: Genevie Ann M.D.   On: 07/19/2015 12:51   Dg Chest 2 View  07/20/2015  CLINICAL DATA:  Dry cough and severe headache yesterday, history hypertension EXAM: CHEST  2 VIEW COMPARISON:  None FINDINGS: Lordotic positioning with slight rotation to the RIGHT on AP view. Normal heart size, mediastinal contours and pulmonary vascularity. Elevation of RIGHT diaphragm with RIGHT basilar atelectasis. Lungs otherwise clear. No pleural effusion or pneumothorax. IMPRESSION: RIGHT basilar atelectasis. Electronically Signed   By: Lavonia Dana M.D.   On: 07/20/2015 11:16   Ct Angio Neck W/cm &/or Wo/cm  07/19/2015  CLINICAL DATA:  65 year old male code stroke. Severe headache and weakness. Right side weakness. Initial encounter. EXAM: CT ANGIOGRAPHY HEAD AND NECK TECHNIQUE: Multidetector CT imaging of the head and neck was performed using the standard protocol during bolus administration of intravenous contrast. Multiplanar CT image reconstructions and MIPs were obtained to evaluate the vascular anatomy. Carotid stenosis measurements (when applicable) are obtained utilizing NASCET criteria, using the distal internal carotid diameter as the denominator. CONTRAST:  80 mL Isovue 370. COMPARISON:  Head CT without contrast 1123 hours today. Brain MRI 01/14/2015. FINDINGS: CTA NECK Skeleton: Intermittent poor dentition. Degenerative changes in the cervical and visualized thoracic spine.  No acute osseous abnormality identified. Mild to moderate paranasal sinus mucosal thickening with chronic periosteal thickening suggesting chronic sinus disease.  Other neck: Mediastinal lipomatosis. No superior mediastinal lymphadenopathy. Negative visualized lung apices aside from atelectasis. Negative thyroid, larynx, pharynx, parapharyngeal spaces, retropharyngeal space, sublingual space, submandibular glands and parotid glands. No cervical lymphadenopathy. Aortic arch: Bovine arch configuration. Minimal to mild calcified arch atherosclerosis. No great vessel origin stenosis. Right carotid system: Mildly tortuous right CCA. Bulky soft and to a lesser extent calcified plaque at the right carotid bifurcation resulting in right ICA origin stenosis of 65 % with respect to the distal vessel. Otherwise negative cervical right ICA. Left carotid system: Tortuous proximal left CCA. Bovine type left CCA origin. At the left carotid bifurcation there is circumferential soft plaque at the left ICA origin, but less than 50 % stenosis with respect to the distal vessel results (series 9, image 119). Otherwise negative cervical left ICA. Vertebral arteries: No proximal right subclavian artery stenosis. Soft plaque with mild stenosis at the right vertebral artery origin (series 9, image 141). Dominant right vertebral artery otherwise is widely patent to the skullbase. No proximal left subclavian artery stenosis. Non dominant left vertebral artery with a normal origin. Tortuous left V1 segment. No left vertebral artery stenosis to the skullbase. CTA HEAD Posterior circulation: Dominant right vertebral artery with V4 segment calcified plaque resulting an mild to moderate stenosis. Right PICA origin is patent. Non dominant left vertebral artery terminates in the left PICA. Calcified plaque at the basilar artery origin not resulting in stenosis, but there is widespread basilar artery irregularity related to soft atherosclerotic plaque such that a beaded appearance occurs throughout the basilar artery (series 12 images 27 and 28). No high-grade stenosis occurs. The basilar tip remains patent.  SCA and MCA origins are patent, but there is moderate to severe stenosis at the left PCA origin. There is additional moderate tandem left P2 segment stenosis. Bilateral distal PCA branches remain patent. Anterior circulation: Soft plaque in the left ICA at the skullbase resulting in up to 50 % stenosis with respect to the distal vessel. Left ICA siphon is patent with mild cavernous and moderate supraclinoid segment calcified plaque not resulting in stenosis. Patent left ICA terminus. Normal left ophthalmic artery origin. Mild cavernous and moderate supra clinoid right ICA calcified plaque without stenosis. Normal right ophthalmic artery origin. Patent right ICA terminus. Right MCA and ACA origins are within normal limits. Fenestrated anterior communicating artery is present. Bilateral A2 and distal ACA branches are within normal limits. Right MCA M1 segment, bifurcation, and right MCA branches are within normal limits. Left ACA origin is highly stenotic such that there is minimal if any flow in the left A1 segment. Still, the left A2 is adequately supplied by the anterior communicating artery. Left MCA origin, M1 segment, bifurcation, and left MCA branches are within normal limits. Venous sinuses: Patent. Anatomic variants: Dominant right vertebral artery. Left vertebral artery terminates in PICA. Bovine type arch configuration. IMPRESSION: 1. Negative for emergent large vessel occlusion. 2. Bulky soft plaque at the right carotid bifurcation resulting in 65% stenosis of the right ICA origin. 3. Severe intracranial basilar artery atherosclerosis, resulting in a beaded appearance of the vessel throughout its course, but no hemodynamically significant basilar artery stenosis. There is superimposed mild to moderate stenosis of the right vertebral artery V4 segment which supplies the basilar. The non dominant left vertebral artery terminates in the left PICA. 4. Severe stenosis at the left PCA origin with preserved  distal flow. 5. Severe stenosis at the left ACA origin with adequately reconstituted A2 segment flow via the anterior communicating artery. 6. Other intracranial and extracranial atherosclerosis is not hemodynamically significant. Electronically Signed   By: Genevie Ann M.D.   On: 07/19/2015 12:51   Mr Jeri Cos ZO Contrast  07/21/2015  CLINICAL DATA:  65 year old male with headache and right side weakness. Initial encounter. EXAM: MRI HEAD WITHOUT AND WITH CONTRAST MRV HEAD WITHOUT CONTRAST TECHNIQUE: Multiplanar, multiecho pulse sequences of the brain and surrounding structures were obtained without and with intravenous contrast. Angiographic images of the head were obtained using MRV technique without contrast. CONTRAST:  20 mL MultiHance COMPARISON:  CTA head and neck 07/19/2015.  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 No restricted diffusion or evidence of acute infarction. No midline shift, mass effect, evidence of mass lesion, ventriculomegaly, extra-axial collection or acute intracranial hemorrhage. Cervicomedullary junction and pituitary are within normal limits. Stable mild for age nonspecific cerebral white matter T2 and FLAIR hyperintensity, slightly greater in the left hemisphere. No cortical encephalomalacia or chronic cerebral blood products. Deep gray matter nuclei, brainstem, and cerebellum are normal for age. No abnormal enhancement identified. No dural thickening. Visible internal auditory structures appear normal. Mastoids are clear. Mild paranasal sinus mucosal thickening. Negative orbit and scalp soft tissues. Negative visualized cervical spine. Normal bone marrow signal. MRV HEAD FINDINGS Preserved flow signal in the superior sagittal sinus, the torcula, both transverse sinuses  (the left is dominant) both sigmoid sinuses, and both IJ bulbs. Preserved flow signal in the straight sinus, vein of Galen, internal cerebral veins, and basal veins of Rosenthal. Flow signal in the major cortical veins appears preserved including the Trolard and veins of Labbe. IMPRESSION: 1. Normal intracranial MRV. 2. No acute intracranial abnormality. Stable MRI appearance of the brain since 2016. 3. 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 Electronically Signed   By: Genevie Ann M.D.   On: 07/21/2015 12:02   Mr Mrv Head Wo Cm  07/21/2015  CLINICAL DATA:  65 year old male with headache and right side weakness. Initial encounter. EXAM: MRI HEAD WITHOUT AND WITH CONTRAST MRV HEAD WITHOUT CONTRAST TECHNIQUE: Multiplanar, multiecho pulse sequences of the brain and surrounding structures were obtained without and with intravenous contrast. Angiographic images of the head were obtained using MRV technique without contrast. CONTRAST:  20 mL MultiHance COMPARISON:  CTA head and neck 07/19/2015.  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 No restricted diffusion or evidence of acute infarction. No midline shift, mass effect, evidence of mass lesion, ventriculomegaly, extra-axial collection or acute intracranial hemorrhage. Cervicomedullary junction and pituitary are within normal limits. Stable mild for age nonspecific cerebral white matter T2 and FLAIR hyperintensity, slightly greater in the left hemisphere. No cortical encephalomalacia or chronic cerebral blood products. Deep gray matter nuclei, brainstem, and cerebellum are normal for age. No abnormal enhancement identified. No dural thickening.  Visible internal auditory structures appear normal. Mastoids are clear. Mild paranasal sinus mucosal thickening. Negative orbit and scalp soft tissues. Negative visualized cervical spine. Normal bone  marrow signal. MRV HEAD FINDINGS Preserved flow signal in the superior sagittal sinus, the torcula, both transverse sinuses (the left is dominant) both sigmoid sinuses, and both IJ bulbs. Preserved flow signal in the straight sinus, vein of Galen, internal cerebral veins, and basal veins of Rosenthal. Flow signal in the major cortical veins appears preserved including the [place]rolard and veins of Labbe. IMPRESSION: 1. Normal intracranial MRV. 2. No acute intracranial abnormality. Stable MRI appearance of the brain since 2016. 3. 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 Electronically Signed   By: Genevie Ann M.D.   On: 07/21/2015 12:02   Ct Head Code Stroke W/o Cm  07/19/2015  CLINICAL DATA:  Severe headache and right-sided weakness today. EXAM: CT HEAD WITHOUT CONTRAST TECHNIQUE: Contiguous axial images were obtained from the base of the skull through the vertex without intravenous contrast. COMPARISON:  Head CT 01/14/2015 and MRI brain 01/14/2015 FINDINGS: Stable age related cerebral atrophy, ventriculomegaly and periventricular white matter disease. No findings for acute hemispheric infarction an or intracranial hemorrhage. No extra-axial fluid collections are identified. No mass lesions. The brainstem and cerebellum are grossly normal in stable. Vascular calcifications are noted. No acute bony findings. Extensive mucoperiosteal thickening involving the maxillary and ethmoid sinuses bilaterally. The mastoid air cells and middle ear cavities are clear. The globes are intact. IMPRESSION: No acute intracranial findings. Specifically, no findings for hemispheric infarction or intracranial hemorrhage. Stable age  related cerebral atrophy and mild ventriculomegaly. Electronically Signed   By: Marijo Sanes M.D.   On: 07/19/2015 11:29   Dg Fluoro Guide Lumbar Puncture  07/24/2015  CLINICAL DATA:  Headache. EXAM: DIAGNOSTIC LUMBAR PUNCTURE UNDER FLUOROSCOPIC GUIDANCE FLUOROSCOPY TIME:  If the device does not provide the exposure index: Fluoroscopy Time (in minutes and seconds):  1 minutes and 24 seconds PROCEDURE: Informed consent was obtained from the patient's wife prior to the procedure, including potential complications of headache, allergy, and pain. With the patient prone, the lower back was prepped with Betadine. 1% Lidocaine was used for local anesthesia. Lumbar puncture was performed at the L3-4 level using a 20 gauge needle with return of pink/sanguinous CSF with an opening pressure of 33 cm water. 6.5 ml of CSF were obtained for laboratory studies. The patient tolerated the procedure well and there were no apparent complications. IMPRESSION: Uncomplicated lumbar puncture, as above. Electronically Signed   By: Abigail Miyamoto M.D.   On: 07/24/2015 14:13     CBC  Recent Labs Lab 07/19/15 1114  07/20/15 0605 07/21/15 0209 07/22/15 0520 07/23/15 0424 07/24/15 0420  WBC 5.9  --  11.5* 15.4* 15.5* 12.5* 10.5  HGB 13.7  < > 14.2 13.9 14.7 15.2 14.5  HCT 42.2  < > 43.4 43.5 46.7 47.2 45.6  PLT 177  --  218 227 246 263 248  MCV 85.9  --  85.4 88.4 87.8 87.6 86.7  MCH 27.9  --  28.0 28.3 27.6 28.2 27.6  MCHC 32.5  --  32.7 32.0 31.5 32.2 31.8  RDW 13.6  --  13.5 13.9 14.0 14.4 14.3  LYMPHSABS 2.1  --   --   --   --   --   --   MONOABS 0.4  --   --   --   --   --   --   EOSABS 0.3  --   --   --   --   --   --  BASOSABS 0.0  --   --   --   --   --   --   < > = values in this interval not displayed.  Chemistries   Recent Labs Lab 07/19/15 1114  07/20/15 0605 07/21/15 0209 07/22/15 0520 07/23/15 0424 07/24/15 0420  NA 140  < > 136 138 137 138 139  K 4.5  < > 4.4 4.8 4.6 4.1 3.9  CL 105  < >  101 103 101 103 104  CO2 22  --  _0 GLUCOSE 157*  < > 147* 133* 138* 135* 99  BUN 14  < > 20 24* 37* 47* 46*  CREATININE 1.44*  < > 1.27* 1.33* 1.53* 1.80* 1.62*  CALCIUM 9.2  --  9.2 9.1 9.2 8.9 8.5*  AST 26  --   --  _1 ALT 21  --   --  17 18 16* 13*  ALKPHOS 59  --   --  54 52 45 40  BILITOT 0.9  --   --  0.5 0.8 0.9 1.0  < > = values in this interval not displayed. ------------------------------------------------------------------------------------------------------------------ CrCl cannot be calculated (Unknown ideal weight.). ------------------------------------------------------------------------------------------------------------------ No results for input(s): HGBA1C in the last 72 hours. ------------------------------------------------------------------------------------------------------------------ No results for input(s): CHOL, HDL, LDLCALC, TRIG, CHOLHDL, LDLDIRECT in the last 72 hours. ------------------------------------------------------------------------------------------------------------------ No results for input(s): TSH, T4TOTAL, T3FREE, THYROIDAB in the last 72 hours.  Invalid input(s): FREET3 ------------------------------------------------------------------------------------------------------------------ No results for input(s): VITAMINB12, FOLATE, FERRITIN, TIBC, IRON, RETICCTPCT in the last 72 hours.  Coagulation profile  Recent Labs Lab 07/19/15 1114  INR 1.10    No results for input(s): DDIMER in the last 72 hours.  Cardiac Enzymes  Recent Labs Lab 07/20/15 1211  TROPONINI <0.03   ------------------------------------------------------------------------------------------------------------------ Invalid input(s): POCBNP   CBG:  Recent Labs Lab 07/24/15 0631 07/24/15 1124 07/24/15 1614 07/24/15 2136 07/25/15 0630  GLUCAP 90 114* 101* 85 109*       Studies: Dg Fluoro Guide Lumbar Puncture  07/24/2015   CLINICAL DATA:  Headache. EXAM: DIAGNOSTIC LUMBAR PUNCTURE UNDER FLUOROSCOPIC GUIDANCE FLUOROSCOPY TIME:  If the device does not provide the exposure index: Fluoroscopy Time (in minutes and seconds):  1 minutes and 24 seconds PROCEDURE: Informed consent was obtained from the patient's wife prior to the procedure, including potential complications of headache, allergy, and pain. With the patient prone, the lower back was prepped with Betadine. 1% Lidocaine was used for local anesthesia. Lumbar puncture was performed at the L3-4 level using a 20 gauge needle with return of pink/sanguinous CSF with an opening pressure of 33 cm water. 6.5 ml of CSF were obtained for laboratory studies. The patient tolerated the procedure well and there were no apparent complications. IMPRESSION: Uncomplicated lumbar puncture, as above. Electronically Signed   By: Abigail Miyamoto M.D.   On: 07/24/2015 14:13      Lab Results  Component Value Date   HGBA1C 6.1* 07/20/2015   HGBA1C 5.9* 01/15/2015   Lab Results  Component Value Date   LDLCALC 122* 07/20/2015   CREATININE 1.62* 07/24/2015       Scheduled Meds: . amLODipine  10 mg Oral Daily  . aspirin EC  81 mg Oral Daily  . clonazePAM  0.5 mg Oral BID  . enoxaparin (LOVENOX) injection  40 mg Subcutaneous Q24H  . hydrALAZINE  100 mg Oral Q6H  . pantoprazole  40 mg Oral Daily  . simvastatin  20 mg Oral QPM  Continuous Infusions:     LOS: 5 days     Time spent: >30 MINS    [facility]  [facility] Pager [phone]. If 7PM-7AM, please contact night-coverage at [email or web address], password [facility] 07/25/2015, 9:39 AM  LOS: 5 days

## 2015-07-25 NOTE — Progress Notes (Signed)
Subjective: More awake but still having bilateral HA. States worse when sitting up --however this has been the case throughout.   Exam: Filed Vitals:   07/25/15 0113 07/25/15 0528  BP: 171/80 176/82  Pulse: 80 78  Temp: 98.9 F (37.2 C) 98.7 F (37.1 C)  Resp: 20 19      Gen: In bed, NAD MS: alert, answers question and follows commands.  CN: PERRLA, EOMI, face symmetric, vision intact Motor: MAEW Sensory: intact throughout   Pertinent Labs:   Ref. Range 07/24/2015 14:00 07/24/2015 14:04  Glucose, CSF Latest Ref Range: 40-70 mg/dL  77 (H)  Total  Protein, CSF Latest Ref Range: 15-45 mg/dL  45  RBC Count, CSF Latest Ref Range: 0 /cu mm 6850 (H)   WBC, CSF Latest Ref Range: 0-5 /cu mm 9 (H)   Other Cells, CSF Unknown TOO FEW TO COUNT,...   Appearance, CSF Latest Ref Range: CLEAR  CLOUDY (A)   Color, CSF Latest Ref Range: COLORLESS  PINK (A)   Supernatant Unknown COLORLESS   Tube # Unknown 3    Opening pressure of [33] cm water. [6.5] ml of CSF were obtained for laboratory studies.   Felicie MornDavid Smith PA-C [facility] [phone]  Impression: 65 YO male with intractable HA. His ICP was high, of unclear significance but this is concerning. His headache was more abrupt than is typical of IIH. There was RBC in the CSF, however with that level of RBC and the duration since onset, there absolutely should be xanthrochromia if this were SASH, and the absence of this is more typical of a traumatic tap. I suspect that this is primary thunderclap headache which can be difficult to treat. Previous migraines(reports "hypertension related" headaches in the past") with severe attack is possible as well.   I think his headache is more movement exacerbated than position exacerbated.    Recommendations: 1) Compazine/benadryl/toradol.  2) If no improvement, may consider acetazolamide for elevated ICP  Ritta SlotMcNeill Hydee Fleece, MD [facility] [phone]  If 7pm- 7am,  please page neurology on call as listed in AMION.  07/25/2015, 10:05 AM

## 2015-07-26 LAB — CBC
HEMATOCRIT: 39.7 % (ref 39.0–52.0)
Hemoglobin: 12.8 g/dL — ABNORMAL LOW (ref 13.0–17.0)
MCH: 28.3 pg (ref 26.0–34.0)
MCHC: 32.2 g/dL (ref 30.0–36.0)
MCV: 87.6 fL (ref 78.0–100.0)
Platelets: 171 10*3/uL (ref 150–400)
RBC: 4.53 MIL/uL (ref 4.22–5.81)
RDW: 13.8 % (ref 11.5–15.5)
WBC: 6.5 10*3/uL (ref 4.0–10.5)

## 2015-07-26 LAB — GLUCOSE, CAPILLARY
GLUCOSE-CAPILLARY: 96 mg/dL (ref 65–99)
Glucose-Capillary: 89 mg/dL (ref 65–99)
Glucose-Capillary: 122 mg/dL — ABNORMAL HIGH (ref 65–99)
Glucose-Capillary: 144 mg/dL — ABNORMAL HIGH (ref 65–99)

## 2015-07-26 LAB — BASIC METABOLIC PANEL
Anion gap: 8 (ref 5–15)
BUN: 27 mg/dL — ABNORMAL HIGH (ref 6–20)
CHLORIDE: 103 mmol/L (ref 101–111)
CO2: 28 mmol/L (ref 22–32)
Calcium: 8.1 mg/dL — ABNORMAL LOW (ref 8.9–10.3)
Creatinine, Ser: 1.23 mg/dL (ref 0.61–1.24)
GFR calc non Af Amer: 60 mL/min — ABNORMAL LOW (ref >60)
Glucose, Bld: 95 mg/dL (ref 65–99)
POTASSIUM: 3.9 mmol/L (ref 3.5–5.1)
SODIUM: 139 mmol/L (ref 135–145)

## 2015-07-26 LAB — B. BURGDORFI ANTIBODIES: B burgdorferi Ab IgG+IgM: <0.91 {ISR} (ref 0.00–0.90)

## 2015-07-26 MED ORDER — DIPHENHYDRAMINE HCL 50 MG/ML IJ SOLN: 12.5000 mg | Freq: Four times a day (QID) | INTRAMUSCULAR | Status: DC | PRN

## 2015-07-26 MED ORDER — PROCHLORPERAZINE EDISYLATE 5 MG/ML IJ SOLN: 10.0000 mg | Freq: Four times a day (QID) | INTRAMUSCULAR | Status: DC | PRN

## 2015-07-26 NOTE — Progress Notes (Signed)
[facility]. Last cocktail doses meds given 0240. q6 meds rescheduled for 0840.

## 2015-07-26 NOTE — Progress Notes (Addendum)
[facility] PROGRESS NOTE  Alexander Gonzalez QQP:[id number] DOB: February 03, 1951 DOA: 07/19/2015   PCP: Kandice Hams, MD     Assessment/Plan: Principal Problem:   Severe headache Active Problems:   HTN (hypertension)   Hyperlipidemia   Abnormal computed tomography angiography of brain   CKD (chronic kidney disease), stage III   Acute hyperglycemia   Basilar artery stenosis   Hypertensive emergency   Occlusion and stenosis of basilar artery   Cephalalgia    Brief summary 65 y.o. male with a Past Medical History of HTN, OA, TIA, HLD who presents with HA and R sided weakness and abnormal basilar artery circulation. IR had scheduled to perform arteriogram with possible intervention, this was canceled due to intractable headache and he recommended this to be done as an outpatient. Neuro and IR following. Patient is status post LP  4/25    Assessment and plan Severe headache/ Abnormal computed tomography angiography of brain -Patient presented with severe headache transient right sided weakness (primarily hand) and elevated blood pressure and reported compliance with medications-imaging reveals basilar circulatory abnormalities-CT head without acute stroke and NIHSS was 0 Neurology and interventional radiology consulted -IR recommended cerebral arteriogram with possibility of pursuing intracranial vascular intervention if indicated (concern over possible aneurysm), 4/21, however unable to be completed due to intractable headache Headache management per neurology MRI, MRV for severe headache was normal no intracranial abnormality, stable MRI since 2016 IR plans to do angiogram as outpatient  LP was attempted at bedside today by me to rule out any other causes for this HA such as SAH or infections, although CT brain, CTA head and neck, brain MRI w/wo, and MRV studies are all negative, no fevers or any other clinical signs or lab abnormalities to suspect infections. Bedside LP  unsuccessful therefore the patient had IR guided LP on 4/25  Consistent with a traumatic tap,  Neurology has discontinued Depakote and baclofen CSF analysis shows mildly elevated glucose, follow CSF fluid culture, fungal culture, VDRL, ESR, CRP  Within normal limits, Lyme titers   pending Headache improved after receiving Compazine/benadryl/toradol.     HTN (hypertension), improved Continue Norvasc, hydralazine, hold Cozaar due to increasing creatinine, -Hydralazine prn 2-D echo results LV EF: 65% - 70%    Acute kidney injury, CKD (chronic kidney disease), stage III, baseline 1.4, creatinine peaked at 1.8, currently 1.23 -Likely related to hypertension, prerenal Discontinued Cozaar, will not need ACE/ARB inhibitor as the patient has a normal EF    Hyperlipidemia -Continue preadmission Zocor   Acute hyperglycemia Hemoglobin A1c 6.1  Chest pain , noncardiac  Like esophageal spasma, EKG unchanged  Cardiac enzymes negative D-dimer negative   DVT prophylaxsis Lovenox  Code Status:  Full code    Code Status Orders       Family Communication: Discussed in detail with the patient/Daughter in the room, all imaging results, lab results explained to the patient   Disposition Plan:  Anticipate discharge to SNF tomorrow,      Consultants:  Neurology  Interventional radiology  *  Procedures:  None  Antibiotics: Anti-infectives    None         HPI/Subjective: Patient denies any significant headache this morning, patient is somnolent but easily arousable  Objective: Filed Vitals:   07/26/15 0239 07/26/15 0255 07/26/15 0257 07/26/15 0530  BP: 170/58 154/72 157/72 144/68  Pulse:  73 74 76  Temp:    97.4 F (36.3 C)  TempSrc:    Oral  Resp:  20  Weight:      SpO2:    96%   No intake or output data in the 24 hours ending 07/26/15 0824  Exam:  Examination:  General exam: Appears calm and comfortable  Respiratory system: Clear to  auscultation. Respiratory effort normal. Cardiovascular system: S1 & S2 heard, RRR. No JVD, murmurs, rubs, gallops or clicks. No pedal edema. Gastrointestinal system: Abdomen is nondistended, soft and nontender. No organomegaly or masses felt. Normal bowel sounds heard. Central nervous system: Alert and oriented. No focal neurological deficits. Extremities: Symmetric 5 x 5 power. Skin: No rashes, lesions or ulcers Psychiatry: Judgement and insight appear normal. Mood & affect appropriate.     Data Reviewed: I have personally reviewed following labs and imaging studies  Micro Results Recent Results (from the past 240 hour(s))  CSF culture     Status: None (Preliminary result)   Collection Time: 07/24/15  2:02 PM  Result Value Ref Range Status   Specimen Description CSF  Final   Special Requests NONE  Final   Gram Stain NO WBC SEEN NO ORGANISMS SEEN   Final   Culture NO GROWTH < 24 HOURS  Final   Report Status PENDING  Incomplete    Radiology Reports Ct Angio Head W/cm &/or Wo Cm  07/19/2015  CLINICAL DATA:  65 year old male code stroke. Severe headache and weakness. Right side weakness. Initial encounter. EXAM: CT ANGIOGRAPHY HEAD AND NECK TECHNIQUE: Multidetector CT imaging of the head and neck was performed using the standard protocol during bolus administration of intravenous contrast. Multiplanar CT image reconstructions and MIPs were obtained to evaluate the vascular anatomy. Carotid stenosis measurements (when applicable) are obtained utilizing NASCET criteria, using the distal internal carotid diameter as the denominator. CONTRAST:  80 mL Isovue 370. COMPARISON:  Head CT without contrast 1123 hours today. Brain MRI 01/14/2015. FINDINGS: CTA NECK Skeleton: Intermittent poor dentition. Degenerative changes in the cervical and visualized thoracic spine. No acute osseous abnormality identified. Mild to moderate paranasal sinus mucosal thickening with chronic periosteal thickening  suggesting chronic sinus disease. Other neck: Mediastinal lipomatosis. No superior mediastinal lymphadenopathy. Negative visualized lung apices aside from atelectasis. Negative thyroid, larynx, pharynx, parapharyngeal spaces, retropharyngeal space, sublingual space, submandibular glands and parotid glands. No cervical lymphadenopathy. Aortic arch: Bovine arch configuration. Minimal to mild calcified arch atherosclerosis. No great vessel origin stenosis. Right carotid system: Mildly tortuous right CCA. Bulky soft and to a lesser extent calcified plaque at the right carotid bifurcation resulting in right ICA origin stenosis of 65 % with respect to the distal vessel. Otherwise negative cervical right ICA. Left carotid system: Tortuous proximal left CCA. Bovine type left CCA origin. At the left carotid bifurcation there is circumferential soft plaque at the left ICA origin, but less than 50 % stenosis with respect to the distal vessel results (series 9, image 119). Otherwise negative cervical left ICA. Vertebral arteries: No proximal right subclavian artery stenosis. Soft plaque with mild stenosis at the right vertebral artery origin (series 9, image 141). Dominant right vertebral artery otherwise is widely patent to the skullbase. No proximal left subclavian artery stenosis. Non dominant left vertebral artery with a normal origin. Tortuous left V1 segment. No left vertebral artery stenosis to the skullbase. CTA HEAD Posterior circulation: Dominant right vertebral artery with V4 segment calcified plaque resulting an mild to moderate stenosis. Right PICA origin is patent. Non dominant left vertebral artery terminates in the left PICA. Calcified plaque at the basilar artery origin not resulting in stenosis, but there  is widespread basilar artery irregularity related to soft atherosclerotic plaque such that a beaded appearance occurs throughout the basilar artery (series 12 images 27 and 28). No high-grade stenosis occurs.  The basilar tip remains patent. SCA and MCA origins are patent, but there is moderate to severe stenosis at the left PCA origin. There is additional moderate tandem left P2 segment stenosis. Bilateral distal PCA branches remain patent. Anterior circulation: Soft plaque in the left ICA at the skullbase resulting in up to 50 % stenosis with respect to the distal vessel. Left ICA siphon is patent with mild cavernous and moderate supraclinoid segment calcified plaque not resulting in stenosis. Patent left ICA terminus. Normal left ophthalmic artery origin. Mild cavernous and moderate supra clinoid right ICA calcified plaque without stenosis. Normal right ophthalmic artery origin. Patent right ICA terminus. Right MCA and ACA origins are within normal limits. Fenestrated anterior communicating artery is present. Bilateral A2 and distal ACA branches are within normal limits. Right MCA M1 segment, bifurcation, and right MCA branches are within normal limits. Left ACA origin is highly stenotic such that there is minimal if any flow in the left A1 segment. Still, the left A2 is adequately supplied by the anterior communicating artery. Left MCA origin, M1 segment, bifurcation, and left MCA branches are within normal limits. Venous sinuses: Patent. Anatomic variants: Dominant right vertebral artery. Left vertebral artery terminates in PICA. Bovine type arch configuration. IMPRESSION: 1. Negative for emergent large vessel occlusion. 2. Bulky soft plaque at the right carotid bifurcation resulting in 65% stenosis of the right ICA origin. 3. Severe intracranial basilar artery atherosclerosis, resulting in a beaded appearance of the vessel throughout its course, but no hemodynamically significant basilar artery stenosis. There is superimposed mild to moderate stenosis of the right vertebral artery V4 segment which supplies the basilar. The non dominant left vertebral artery terminates in the left PICA. 4. Severe stenosis at the left  PCA origin with preserved distal flow. 5. Severe stenosis at the left ACA origin with adequately reconstituted A2 segment flow via the anterior communicating artery. 6. Other intracranial and extracranial atherosclerosis is not hemodynamically significant. Electronically Signed   By: Genevie Ann M.D.   On: 07/19/2015 12:51   Dg Chest 2 View  07/20/2015  CLINICAL DATA:  Dry cough and severe headache yesterday, history hypertension EXAM: CHEST  2 VIEW COMPARISON:  None FINDINGS: Lordotic positioning with slight rotation to the RIGHT on AP view. Normal heart size, mediastinal contours and pulmonary vascularity. Elevation of RIGHT diaphragm with RIGHT basilar atelectasis. Lungs otherwise clear. No pleural effusion or pneumothorax. IMPRESSION: RIGHT basilar atelectasis. Electronically Signed   By: Lavonia Dana M.D.   On: 07/20/2015 11:16   Ct Angio Neck W/cm &/or Wo/cm  07/19/2015  CLINICAL DATA:  65 year old male code stroke. Severe headache and weakness. Right side weakness. Initial encounter. EXAM: CT ANGIOGRAPHY HEAD AND NECK TECHNIQUE: Multidetector CT imaging of the head and neck was performed using the standard protocol during bolus administration of intravenous contrast. Multiplanar CT image reconstructions and MIPs were obtained to evaluate the vascular anatomy. Carotid stenosis measurements (when applicable) are obtained utilizing NASCET criteria, using the distal internal carotid diameter as the denominator. CONTRAST:  80 mL Isovue 370. COMPARISON:  Head CT without contrast 1123 hours today. Brain MRI 01/14/2015. FINDINGS: CTA NECK Skeleton: Intermittent poor dentition. Degenerative changes in the cervical and visualized thoracic spine. No acute osseous abnormality identified. Mild to moderate paranasal sinus mucosal thickening with chronic periosteal thickening suggesting chronic sinus disease.  Other neck: Mediastinal lipomatosis. No superior mediastinal lymphadenopathy. Negative visualized lung apices aside  from atelectasis. Negative thyroid, larynx, pharynx, parapharyngeal spaces, retropharyngeal space, sublingual space, submandibular glands and parotid glands. No cervical lymphadenopathy. Aortic arch: Bovine arch configuration. Minimal to mild calcified arch atherosclerosis. No great vessel origin stenosis. Right carotid system: Mildly tortuous right CCA. Bulky soft and to a lesser extent calcified plaque at the right carotid bifurcation resulting in right ICA origin stenosis of 65 % with respect to the distal vessel. Otherwise negative cervical right ICA. Left carotid system: Tortuous proximal left CCA. Bovine type left CCA origin. At the left carotid bifurcation there is circumferential soft plaque at the left ICA origin, but less than 50 % stenosis with respect to the distal vessel results (series 9, image 119). Otherwise negative cervical left ICA. Vertebral arteries: No proximal right subclavian artery stenosis. Soft plaque with mild stenosis at the right vertebral artery origin (series 9, image 141). Dominant right vertebral artery otherwise is widely patent to the skullbase. No proximal left subclavian artery stenosis. Non dominant left vertebral artery with a normal origin. Tortuous left V1 segment. No left vertebral artery stenosis to the skullbase. CTA HEAD Posterior circulation: Dominant right vertebral artery with V4 segment calcified plaque resulting an mild to moderate stenosis. Right PICA origin is patent. Non dominant left vertebral artery terminates in the left PICA. Calcified plaque at the basilar artery origin not resulting in stenosis, but there is widespread basilar artery irregularity related to soft atherosclerotic plaque such that a beaded appearance occurs throughout the basilar artery (series 12 images 27 and 28). No high-grade stenosis occurs. The basilar tip remains patent. SCA and MCA origins are patent, but there is moderate to severe stenosis at the left PCA origin. There is additional  moderate tandem left P2 segment stenosis. Bilateral distal PCA branches remain patent. Anterior circulation: Soft plaque in the left ICA at the skullbase resulting in up to 50 % stenosis with respect to the distal vessel. Left ICA siphon is patent with mild cavernous and moderate supraclinoid segment calcified plaque not resulting in stenosis. Patent left ICA terminus. Normal left ophthalmic artery origin. Mild cavernous and moderate supra clinoid right ICA calcified plaque without stenosis. Normal right ophthalmic artery origin. Patent right ICA terminus. Right MCA and ACA origins are within normal limits. Fenestrated anterior communicating artery is present. Bilateral A2 and distal ACA branches are within normal limits. Right MCA M1 segment, bifurcation, and right MCA branches are within normal limits. Left ACA origin is highly stenotic such that there is minimal if any flow in the left A1 segment. Still, the left A2 is adequately supplied by the anterior communicating artery. Left MCA origin, M1 segment, bifurcation, and left MCA branches are within normal limits. Venous sinuses: Patent. Anatomic variants: Dominant right vertebral artery. Left vertebral artery terminates in PICA. Bovine type arch configuration. IMPRESSION: 1. Negative for emergent large vessel occlusion. 2. Bulky soft plaque at the right carotid bifurcation resulting in 65% stenosis of the right ICA origin. 3. Severe intracranial basilar artery atherosclerosis, resulting in a beaded appearance of the vessel throughout its course, but no hemodynamically significant basilar artery stenosis. There is superimposed mild to moderate stenosis of the right vertebral artery V4 segment which supplies the basilar. The non dominant left vertebral artery terminates in the left PICA. 4. Severe stenosis at the left PCA origin with preserved distal flow. 5. Severe stenosis at the left ACA origin with adequately reconstituted A2 segment flow via the anterior  communicating artery. 6. Other intracranial and extracranial atherosclerosis is not hemodynamically significant. Electronically Signed   By: Genevie Ann M.D.   On: 07/19/2015 12:51   Mr Jeri Cos TA Contrast  07/21/2015  CLINICAL DATA:  65 year old male with headache and right side weakness. Initial encounter. EXAM: MRI HEAD WITHOUT AND WITH CONTRAST MRV HEAD WITHOUT CONTRAST TECHNIQUE: Multiplanar, multiecho pulse sequences of the brain and surrounding structures were obtained without and with intravenous contrast. Angiographic images of the head were obtained using MRV technique without contrast. CONTRAST:  20 mL MultiHance COMPARISON:  CTA head and neck 07/19/2015.  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 No restricted diffusion or evidence of acute infarction. No midline shift, mass effect, evidence of mass lesion, ventriculomegaly, extra-axial collection or acute intracranial hemorrhage. Cervicomedullary junction and pituitary are within normal limits. Stable mild for age nonspecific cerebral white matter T2 and FLAIR hyperintensity, slightly greater in the left hemisphere. No cortical encephalomalacia or chronic cerebral blood products. Deep gray matter nuclei, brainstem, and cerebellum are normal for age. No abnormal enhancement identified. No dural thickening. Visible internal auditory structures appear normal. Mastoids are clear. Mild paranasal sinus mucosal thickening. Negative orbit and scalp soft tissues. Negative visualized cervical spine. Normal bone marrow signal. MRV HEAD FINDINGS Preserved flow signal in the superior sagittal sinus, the torcula, both transverse sinuses (the left is dominant) both sigmoid sinuses, and both IJ bulbs. Preserved flow signal in the straight sinus, vein of  Galen, internal cerebral veins, and basal veins of Rosenthal. Flow signal in the major cortical veins appears preserved including the [place]rolard and veins of Labbe. IMPRESSION: 1. Normal intracranial MRV. 2. No acute intracranial abnormality. Stable MRI appearance of the brain since 2016. 3. 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 Electronically Signed   By: Genevie Ann M.D.   On: 07/21/2015 12:02   Mr Mrv Head Wo Cm  07/21/2015  CLINICAL DATA:  65 year old male with headache and right side weakness. Initial encounter. EXAM: MRI HEAD WITHOUT AND WITH CONTRAST MRV HEAD WITHOUT CONTRAST TECHNIQUE: Multiplanar, multiecho pulse sequences of the brain and surrounding structures were obtained without and with intravenous contrast. Angiographic images of the head were obtained using MRV technique without contrast. CONTRAST:  20 mL MultiHance COMPARISON:  CTA head and neck 07/19/2015.  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 No restricted diffusion or evidence of acute infarction. No midline shift, mass effect, evidence of mass lesion, ventriculomegaly, extra-axial collection or acute intracranial hemorrhage. Cervicomedullary junction and pituitary are within normal limits. Stable mild for age nonspecific cerebral white matter T2 and FLAIR hyperintensity, slightly greater in the left hemisphere. No cortical encephalomalacia or chronic cerebral blood products. Deep gray matter nuclei, brainstem, and cerebellum are normal for age. No abnormal enhancement identified. No dural thickening. Visible internal auditory structures appear normal. Mastoids are clear. Mild paranasal sinus mucosal thickening.  Negative orbit and scalp soft tissues. Negative visualized cervical spine. Normal bone marrow signal. MRV HEAD FINDINGS Preserved flow signal in the superior sagittal sinus, the torcula, both transverse sinuses (the  left is dominant) both sigmoid sinuses, and both IJ bulbs. Preserved flow signal in the straight sinus, vein of Galen, internal cerebral veins, and basal veins of Rosenthal. Flow signal in the major cortical veins appears preserved including the [place]rol[place] and veins of Labbe. IMPRESSION: 1. Normal intracranial MRV. 2. No acute intracranial abnormality. Stable MRI appearance of the brain since 2016. 3. 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 Electronically Signed   By: Genevie Ann M.D.   On: 07/21/2015 12:02   Ct Head Code Stroke W/o Cm  07/19/2015  CLINICAL DATA:  Severe headache and right-sided weakness today. EXAM: CT HEAD WITHOUT CONTRAST TECHNIQUE: Contiguous axial images were obtained from the base of the skull through the vertex without intravenous contrast. COMPARISON:  Head CT 01/14/2015 and MRI brain 01/14/2015 FINDINGS: Stable age related cerebral atrophy, ventriculomegaly and periventricular white matter disease. No findings for acute hemispheric infarction an or intracranial hemorrhage. No extra-axial fluid collections are identified. No mass lesions. The brainstem and cerebellum are grossly normal in stable. Vascular calcifications are noted. No acute bony findings. Extensive mucoperiosteal thickening involving the maxillary and ethmoid sinuses bilaterally. The mastoid air cells and middle ear cavities are clear. The globes are intact. IMPRESSION: No acute intracranial findings. Specifically, no findings for hemispheric infarction or intracranial hemorrhage. Stable age related cerebral atrophy and mild ventriculomegaly. Electronically Signed   By: Marijo Sanes M.D.   On:  07/19/2015 11:29   Dg Fluoro Guide Lumbar Puncture  07/24/2015  CLINICAL DATA:  Headache. EXAM: DIAGNOSTIC LUMBAR PUNCTURE UNDER FLUOROSCOPIC GUIDANCE FLUOROSCOPY TIME:  If the device does not provide the exposure index: Fluoroscopy Time (in minutes and seconds):  1 minutes and 24 seconds PROCEDURE: Informed consent was obtained from the patient's wife prior to the procedure, including potential complications of headache, allergy, and pain. With the patient prone, the lower back was prepped with Betadine. 1% Lidocaine was used for local anesthesia. Lumbar puncture was performed at the L3-4 level using a 20 gauge needle with return of pink/sanguinous CSF with an opening pressure of 33 cm water. 6.5 ml of CSF were obtained for laboratory studies. The patient tolerated the procedure well and there were no apparent complications. IMPRESSION: Uncomplicated lumbar puncture, as above. Electronically Signed   By: Abigail Miyamoto M.D.   On: 07/24/2015 14:13     CBC  Recent Labs Lab 07/19/15 1114  07/21/15 0209 07/22/15 0520 07/23/15 0424 07/24/15 0420 07/26/15 0651  WBC 5.9  < > 15.4* 15.5* 12.5* 10.5 6.5  HGB 13.7  < > 13.9 14.7 15.2 14.5 12.8*  HCT 42.2  < > 43.5 46.7 47.2 45.6 39.7  PLT 177  < > 227 246 263 248 171  MCV 85.9  < > 88.4 87.8 87.6 86.7 87.6  MCH 27.9  < > 28.3 27.6 28.2 27.6 28.3  MCHC 32.5  < > 32.0 31.5 32.2 31.8 32.2  RDW 13.6  < > 13.9 14.0 14.4 14.3 13.8  LYMPHSABS 2.1  --   --   --   --   --   --   MONOABS 0.4  --   --   --   --   --   --   EOSABS 0.3  --   --   --   --   --   --   BASOSABS 0.0  --   --   --   --   --   --   < > =  values in this interval not displayed.  Chemistries   Recent Labs Lab 07/21/15 0209 07/22/15 0520 07/23/15 0424 07/24/15 0420 07/25/15 1014 07/26/15 0651  NA 138 137 138 139 138 139  K 4.8 4.6 4.1 3.9 3.8 3.9  CL 103 101 103 104 103 103  CO2 _0 GLUCOSE 133* 138* 135* 99 98 95  BUN 24* 37* 47* 46* 29* 27*  CREATININE  1.33* 1.53* 1.80* 1.62* 1.29* 1.23  CALCIUM 9.1 9.2 8.9 8.5* 8.4* 8.1*  AST _1 --   ALT 17 18 16* 13* 15*  --   ALKPHOS 54 52 45 40 34*  --   BILITOT 0.5 0.8 0.9 1.0 1.3*  --    ------------------------------------------------------------------------------------------------------------------ CrCl cannot be calculated (Unknown ideal weight.). ------------------------------------------------------------------------------------------------------------------ No results for input(s): HGBA1C in the last 72 hours. ------------------------------------------------------------------------------------------------------------------ No results for input(s): CHOL, HDL, LDLCALC, TRIG, CHOLHDL, LDLDIRECT in the last 72 hours. ------------------------------------------------------------------------------------------------------------------ No results for input(s): TSH, T4TOTAL, T3FREE, THYROIDAB in the last 72 hours.  Invalid input(s): FREET3 ------------------------------------------------------------------------------------------------------------------ No results for input(s): VITAMINB12, FOLATE, FERRITIN, TIBC, IRON, RETICCTPCT in the last 72 hours.  Coagulation profile  Recent Labs Lab 07/19/15 1114  INR 1.10    No results for input(s): DDIMER in the last 72 hours.  Cardiac Enzymes  Recent Labs Lab 07/20/15 1211  TROPONINI <0.03   ------------------------------------------------------------------------------------------------------------------ Invalid input(s): POCBNP   CBG:  Recent Labs Lab 07/25/15 0630 07/25/15 1128 07/25/15 1649 07/25/15 2113 07/26/15 0644  GLUCAP 109* 95 84 90 89       Studies: Dg Fluoro Guide Lumbar Puncture  07/24/2015  CLINICAL DATA:  Headache. EXAM: DIAGNOSTIC LUMBAR PUNCTURE UNDER FLUOROSCOPIC GUIDANCE FLUOROSCOPY TIME:  If the device does not provide the exposure index: Fluoroscopy Time (in minutes and seconds):  1 minutes and 24  seconds PROCEDURE: Informed consent was obtained from the patient's wife prior to the procedure, including potential complications of headache, allergy, and pain. With the patient prone, the lower back was prepped with Betadine. 1% Lidocaine was used for local anesthesia. Lumbar puncture was performed at the L3-4 level using a 20 gauge needle with return of pink/sanguinous CSF with an opening pressure of 33 cm water. 6.5 ml of CSF were obtained for laboratory studies. The patient tolerated the procedure well and there were no apparent complications. IMPRESSION: Uncomplicated lumbar puncture, as above. Electronically Signed   By: Abigail Miyamoto M.D.   On: 07/24/2015 14:13      Lab Results  Component Value Date   HGBA1C 6.1* 07/20/2015   HGBA1C 5.9* 01/15/2015   Lab Results  Component Value Date   LDLCALC 122* 07/20/2015   CREATININE 1.23 07/26/2015       Scheduled Meds: . amLODipine  10 mg Oral Daily  . aspirin EC  81 mg Oral Daily  . clonazePAM  0.5 mg Oral BID  . prochlorperazine  10 mg Intravenous Q6H   And  . diphenhydrAMINE  12.5 mg Intravenous Q6H  . enoxaparin (LOVENOX) injection  40 mg Subcutaneous Q24H  . hydrALAZINE  100 mg Oral Q6H  . pantoprazole  40 mg Oral Daily  . simvastatin  20 mg Oral QPM   Continuous Infusions:     LOS: 6 days     Time spent: >30 MINS    [facility]  [facility] Pager [phone]. If 7PM-7AM, please contact night-coverage at [email or web address], password [facility] 07/26/2015, 8:24 AM  LOS: 6 days

## 2015-07-26 NOTE — Clinical Social Work Note (Signed)
CSW had discussion with wife by phone regarding patient's insurance and SNF placement. Wife confirms that the patient's insurance [facility] on 5/[facility]. CSW explained how this will limit the SNF options for the patient. The wife states that she and the patient have talked about the SNF recommendation and they do not feel SNF is needed. The wife states that she thinks they can manage at home with a walker, bedside toilet/toilet chair, and shower chair. She states that the patient will have 24hr supervision at home. RNCM updated on situation.   Roddie McBryant Imad Shostak MSW, McNaryLCSW, [place], [phone]

## 2015-07-26 NOTE — Progress Notes (Signed)
Physical Therapy Treatment Patient Details Name: Alexander Gonzalez MRN: [id number] DOB: 10/03/1950 Today's Date: 07/26/2015    History of Present Illness 65 y.o. male with a Past Medical History of HTN, OA, TIA, HLD who presents with HA and R sided weakness and abnormal basilar artery circulation. IR to perform arteriogram and possible intervention. Neuro and IR following.     PT Comments    Patient progressing to be able to walk in hall and up 2 steps with assist and no c/o HA today.  Still needs encouragement, however to participate due to wanting to sleep.  Continue to feel SNF level rehab is appropriate due to not having someone who can physically assist at home consistently (older children work and wife cannot help physically).  Follow Up Recommendations  SNF     Equipment Recommendations  Rolling walker with 5" wheels;Hospital bed    Recommendations for Other Services       Precautions / Restrictions Precautions Precautions: Fall    Mobility  Bed Mobility Overal bed mobility: Needs Assistance Bed Mobility: Sidelying to Sit;Sit to Supine   Sidelying to sit: Mod assist   Sit to supine: Supervision   General bed mobility comments: assist to lift trunk with increased time, elevated HOB and rail  Transfers   Equipment used: Rolling walker (2 wheeled) Transfers: Sit to/from Stand Sit to Stand: Mod assist;From elevated surface         General transfer comment: increased time, another person assisting on L side, but pt agitated due to accidently helping on side where IV site is, so only +1 assist today  Ambulation/Gait Ambulation/Gait assistance: Min assist Ambulation Distance (Feet): 120 Feet Assistive device: Rolling walker (2 wheeled) Gait Pattern/deviations: Decreased stride length;Trunk flexed;Step-through pattern     General Gait Details: pt still flexed despite walker adjustment and cues for posture, some assist to maneuver walker around funiture in  room   Stairs Stairs: Yes Stairs assistance: Min assist Stair Management: One rail Left;Sideways;Step to pattern Number of Stairs: 2 General stair comments: in stairwell practiced with assist and cues; limited to two stairs for safety  Wheelchair Mobility    Modified Rankin (Stroke Patients Only)       Balance Overall balance assessment: Needs assistance   Sitting balance-Leahy Scale: Good Sitting balance - Comments: balance good once upright, but increased time coming up to sit from sidelying   Standing balance support: Bilateral upper extremity supported Standing balance-Leahy Scale: Poor Standing balance comment: heavy UE support on walker with flexed posture                    Cognition Arousal/Alertness: Awake/alert Behavior During Therapy: Agitated Overall Cognitive Status: No family/caregiver present to determine baseline cognitive functioning                      Exercises      General Comments        Pertinent Vitals/Pain Pain Score: 0-No pain    Home Living                      Prior Function            PT Goals (current goals can now be found in the care plan section) Progress towards PT goals: Progressing toward goals    Frequency  Min 3X/week    PT Plan Current plan remains appropriate    Co-evaluation  End of Session Equipment Utilized During Treatment: Gait belt Activity Tolerance: Patient limited by fatigue Patient left: with call bell/phone within reach;in bed;with bed alarm set     Time: 1500-1517 PT Time Calculation (min) (ACUTE ONLY): 17 min  Charges:  $Gait Training: 8-22 mins                    G Codes:      [name] 2015/08/06, 4:07 PM  Sheran Lawless, PT [phone] 08/06/15

## 2015-07-26 NOTE — NC FL2 (Signed)
[place] MEDICAID FL2 LEVEL OF CARE SCREENING TOOL     IDENTIFICATION  Patient Name: Alexander Gonzalez Birthdate: 07/11/1950 Sex: male Admission Date (Current Location): 07/19/2015  [place]  [place] [id number] Facility and Address:  [facility] [place], [address], [place], [place] [address]      Provider Number: [id number]  Attending Physician Name and Address:  Richarda OverlieNayana Abrol, MD  Relative Name and Phone Number:       Current Level of Care: Hospital Recommended Level of Care: Skilled Nursing Facility Prior Approval Number:    Date Approved/Denied:   PASRR Number:   [id number] [id number]  Discharge Plan: SNF    Current Diagnoses: Patient Active Problem List   Diagnosis Date Noted  . Cephalalgia   . Occlusion and stenosis of basilar artery   . Severe headache 07/19/2015  . Abnormal computed tomography angiography of brain 07/19/2015  . CKD (chronic kidney disease), stage III 07/19/2015  . Acute hyperglycemia 07/19/2015  . Basilar artery stenosis   . Hypertensive emergency   . Hypertensive urgency 01/14/2015  . Nausea 01/14/2015  . Cerebrovascular disease 01/14/2015  . HTN (hypertension) 11/21/2012  . Gout 11/21/2012  . Preop cardiovascular exam 11/21/2012  . Hyperlipidemia 11/21/2012    Orientation RESPIRATION BLADDER Height & Weight     Self, Time, Situation, Place  Normal Continent Weight: 275 lb 5.7 oz (124.9 kg) Height:     BEHAVIORAL SYMPTOMS/MOOD NEUROLOGICAL BOWEL NUTRITION STATUS      Continent Diet (HEART HEALTHY)  AMBULATORY STATUS COMMUNICATION OF NEEDS Skin   Extensive Assist   Normal                       Personal Care Assistance Level of Assistance  Total care       Total Care Assistance: Maximum assistance   Functional Limitations Info  Speech     Speech Info: Impaired    SPECIAL CARE FACTORS FREQUENCY  PT (By licensed PT), OT (By licensed OT), Speech therapy     PT Frequency:  (3X/WK) OT  Frequency:  (2X/WK)            Contractures      Additional Factors Info  Code Status, Allergies Code Status Info:  (FULL) Allergies Info:  (NO KNOWN ALLERGIES)           Current Medications (07/26/2015):  This is the current hospital active medication list Current Facility-Administered Medications  Medication Dose Route Frequency Provider Last Rate Last Dose  . acetaminophen (TYLENOL) tablet 1,000 mg  1,000 mg Oral Q6H PRN Ozella Rocksavid J Merrell, MD   1,000 mg at 07/21/15 1429  . alum & mag hydroxide-simeth (MAALOX/MYLANTA) 200-200-20 MG/5ML suspension 15 mL  15 mL Oral Q6H PRN Richarda OverlieNayana Abrol, MD      . amLODipine (NORVASC) tablet 10 mg  10 mg Oral Daily Russella DarAllison L Ellis, NP   10 mg at 07/25/15 1001  . aspirin EC tablet 81 mg  81 mg Oral Daily Russella DarAllison L Ellis, NP   81 mg at 07/25/15 1001  . clonazePAM (KLONOPIN) tablet 0.5 mg  0.5 mg Oral BID Ram Daniel NonesNarayan Kaveer Nandigam, MD   0.5 mg at 07/25/15 2206  . prochlorperazine (COMPAZINE) injection 10 mg  10 mg Intravenous Q6H Rejeana BrockMcNeill P Kirkpatrick, MD   10 mg at 07/26/15 0831   And  . diphenhydrAMINE (BENADRYL) injection 12.5 mg  12.5 mg Intravenous Q6H Rejeana BrockMcNeill P Kirkpatrick, MD   12.5 mg at 07/26/15 0830  .  enoxaparin (LOVENOX) injection 40 mg  40 mg Subcutaneous Q24H Richarda Overlie, MD   40 mg at 07/25/15 1817  . fentaNYL (SUBLIMAZE) injection   Intravenous PRN Julieanne Cotton, MD   25 mcg at 07/20/15 1113  . hydrALAZINE (APRESOLINE) injection 10 mg  10 mg Intravenous Q4H PRN Russella Dar, NP      . hydrALAZINE (APRESOLINE) tablet 100 mg  100 mg Oral Q6H Richarda Overlie, MD   100 mg at 07/26/15 0831  . HYDROmorphone (DILAUDID) injection 1 mg  1 mg Intravenous Q4H PRN Richarda Overlie, MD   1 mg at 07/24/15 9604  . ondansetron (ZOFRAN) injection 4 mg  4 mg Intravenous Q6H PRN Richarda Overlie, MD   4 mg at 07/25/15 1000  . oxyCODONE (Oxy IR/ROXICODONE) immediate release tablet 5 mg  5 mg Oral Q6H PRN Ozella Rocks, MD   5 mg at 07/23/15 0957  .  pantoprazole (PROTONIX) EC tablet 40 mg  40 mg Oral Daily Bertram Millard, RPH   40 mg at 07/25/15 1001  . simvastatin (ZOCOR) tablet 20 mg  20 mg Oral QPM Russella Dar, NP   20 mg at 07/25/15 1817     Discharge Medications: Please see discharge summary for a list of discharge medications.  Relevant Imaging Results:  Relevant Lab Results:   Additional Information  (SSN: [id number])  Orson Gear, Student-SW [phone]

## 2015-07-26 NOTE — Progress Notes (Signed)
Subjective: NO HA today. Just feels very tired.   Exam: Filed Vitals:   07/26/15 0530 07/26/15 1000  BP: 144/68 154/73  Pulse: 76   Temp: 97.4 F (36.3 C)   Resp: 20         Gen: In bed, NAD MS: alert and oriented, able to follow commands.  CN: PERRLA, EOMI, face symmetric, vision intact Motor: MAEW Sensory: intact throughout   Pertinent Labs: none  Felicie MornDavid Smith PA-C [facility] [phone]  Impression: 65 YO male with intractable HA. His ICP was high, of unclear significance but this is concerning. His headache was more abrupt than is typical of IIH. There was RBC in the CSF, however with that level of RBC and the duration since onset, there absolutely should be xanthrochromia if this were SASH, and the absence of this is more typical of a traumatic tap. I suspect that this is primary thunderclap headache which can be difficult to treat. Previous migraines(reports "hypertension related" headaches in the past") with severe attack is possible as well.   He has responded well to typical migraine  Treatments.   Recommendations: 1) I will stop his sedating medications. 2) will follow  Alexander SlotMcNeill Laveah Gloster, MD [facility] [phone]  If 7pm- 7am, please page neurology on call as listed in AMION.  07/26/2015, 10:13 AM

## 2015-07-26 NOTE — Clinical Social Work Note (Cosign Needed)
[facility]. Patient to d/c once medically stable. Patient's spouse has requested that patient's transports via [facility]. BSW intern to arrange transport once d/c summary has been submitted by patient's MD.   Alexander GowerBSW intern remains available.   Alexander Gonzalez BSW intern [phone]

## 2015-07-26 NOTE — Clinical Social Work Placement (Cosign Needed)
   CLINICAL SOCIAL WORK PLACEMENT  NOTE  Date:  07/26/2015  Patient Details  Name: Alexander Gonzalez MRN: [id number] Date of Birth: 06/26/1950  Clinical Social Work is seeking post-discharge placement for this patient at the Skilled  Nursing Facility level of care (*CSW will initial, date and re-position this form in  chart as items are completed):  Yes   Patient/family provided with [place] Clinical Social Work Department's list of facilities offering this level of care within the geographic area requested by the patient (or if unable, by the patient's family).  Yes   Patient/family informed of their freedom to choose among providers that offer the needed level of care, that participate in Medicare, Medicaid or managed care program needed by the patient, have an available bed and are willing to accept the patient.  Yes   Patient/family informed of [facility]'s ownership interest in [facility] and [facility], as well as of the fact that they are under no obligation to receive care at these facilities.  PASRR submitted to EDS on       PASRR number received on       Existing PASRR number confirmed on       FL2 transmitted to all facilities in geographic area requested by pt/family on 07/26/15     FL2 transmitted to all facilities within larger geographic area on       Patient informed that his/her managed care company has contracts with or will negotiate with certain facilities, including the following:            Patient/family informed of bed offers received.  Patient chooses bed at       Physician recommends and patient chooses bed at      Patient to be transferred to   on  .  Patient to be transferred to facility by       Patient family notified on   of transfer.  Name of family member notified:        PHYSICIAN Please prepare priority discharge summary, including medications, Please sign FL2     Additional Comment:     _______________________________________________ Orson Gearatiana Hillman Attig, Student-SW 07/26/2015, 9:01 AM

## 2015-07-26 NOTE — Progress Notes (Signed)
Speech Language Pathology Treatment: Cognitive-Linquistic  Patient Details Name: Denzell Colasanti MRN: [id number] DOB: [birth year] Today's Date: 07/26/2015 Time: 3159-4585 SLP Time Calculation (min) (ACUTE ONLY): 30 min  Assessment / Plan / Recommendation Clinical Impression  Pt seen for ongoing diagnostic therapy as related to cognitive-linguistic. Today pt reports he is no longer experiencing HA, but does have significant fatigue. Despite lethargy, pt was able to maintain adequate alertness to participate in session completing the [facility] Cognitive Assessment (MoCA 7.2) which he scored 27 out of 30 which is WNL. It appears that primary barriers related to pt's optimal cognitive function are pain and fatigue related. Pt may benefit from more in-depth assessment for I-ADL management as he improves at another venue of care, however cognitive function appears to be normalizing with pain management.    HPI HPI: 65 y.o. male with h/o HTN, stage III CKD, dyslipidemia, TIA (1996) and gout, who presented to ED with c/o severe HA with possible R side weakness but no other focal neurological deficits appreciated. MRINo acute intracranial abnormality. Stable MRI appearance of the left vertebral artery rather than No prior h/o SLP intervention.      SLP Plan  All goals met     Recommendations                Follow up Recommendations: None Plan: All goals met     [place] MA, [place] Pager [phone] 07/26/2015, 10:26 AM

## 2015-07-26 NOTE — Clinical Social Work Note (Signed)
CSW has received call from [facility]. The patient apparently has [employer] Medicare which is not accepted by [facility]. CSW has left a message for the patient's wife requesting a call back so that other options can be identified for SNF. Patient's options will be very limited and most facilities do not contract with [employer].  Roddie McBryant Cyndie Woodbeck MSW, [place]CS[place], [place], [phone]

## 2015-07-27 DIAGNOSIS — G44311 Acute post-traumatic headache, intractable: Secondary | ICD-10-CM

## 2015-07-27 LAB — GLUCOSE, CAPILLARY
GLUCOSE-CAPILLARY: 100 mg/dL — AB (ref 65–99)
GLUCOSE-CAPILLARY: 114 mg/dL — AB (ref 65–99)

## 2015-07-27 LAB — CSF CULTURE: CULTURE: NO GROWTH

## 2015-07-27 LAB — CSF CULTURE W GRAM STAIN: Gram Stain: NONE SEEN

## 2015-07-27 MED ORDER — MECLIZINE HCL 12.5 MG PO TABS: 12.5000 mg | ORAL_TABLET | Freq: Two times a day (BID) | ORAL | Status: DC | PRN

## 2015-07-27 MED ORDER — CLONAZEPAM 0.5 MG PO TABS: 0.5000 mg | ORAL_TABLET | Freq: Two times a day (BID) | ORAL | Status: DC

## 2015-07-27 MED ORDER — HYDRALAZINE HCL 100 MG PO TABS: 100.0000 mg | ORAL_TABLET | Freq: Four times a day (QID) | ORAL | Status: DC

## 2015-07-27 MED ORDER — OXYCODONE HCL 5 MG PO TABS: 5.0000 mg | ORAL_TABLET | Freq: Four times a day (QID) | ORAL | Status: DC | PRN

## 2015-07-27 MED ORDER — ACETAMINOPHEN 500 MG PO TABS: 1000.0000 mg | ORAL_TABLET | Freq: Four times a day (QID) | ORAL | Status: DC | PRN

## 2015-07-27 MED ORDER — PANTOPRAZOLE SODIUM 40 MG PO TBEC: 40.0000 mg | DELAYED_RELEASE_TABLET | Freq: Every day | ORAL | Status: DC

## 2015-07-27 MED ORDER — LOSARTAN POTASSIUM 100 MG PO TABS: 50.0000 mg | ORAL_TABLET | Freq: Every day | ORAL | Status: DC

## 2015-07-27 NOTE — Care Management Note (Signed)
Case Management Note  Patient Details  Name: Fuller Mandrildward Vankleeck MRN: [id number] Date of Birth: 07/26/1950  Subjective/Objective:   65 y.o. M admitted 07/19/2015 with Intractable Headache  And RSW.                  Action/Plan: Final disposition Home with HHPT, HHOT and HHRN.Provided by [facility] per Lupita Leashonna. Pt will have the assistance of his wife and daughter , who is an Charity fundraiserN [facility] in [facility]. Original recommendation by PT was for SNF but pt is in between Insurance carriers and would prefer to go home if able at this time. [facility] RW. Hospital Bed will be delivered to home. No further CM needs at present.    Expected Discharge Date:                  Expected Discharge Plan:  Home w Home Health Services  In-House Referral:     Discharge planning Services  CM Consult  Post Acute Care Choice:  Durable Medical Equipment, Home Health Choice offered to:  Patient, Adult Children  DME Arranged:  Walker rolling, Hospital bed DME Agency:  [facility]  HH Arranged:  RN, PT, OT [facility] Agency:  [facility]  Status of Service:  Completed, signed off  Medicare Important Message Given:  Yes Date Medicare IM Given:    Medicare IM give by:    Date Additional Medicare IM Given:    Additional Medicare Important Message give by:     If discussed at Long Length of Stay Meetings, dates discussed:    Additional Comments:  Yvone NeuCrutchfield, Brittiny Levitz M, RN 07/27/2015, 10:53 AM

## 2015-07-27 NOTE — Progress Notes (Signed)
Subjective: NO HA today. Just feels more awake   Exam: Filed Vitals:   07/27/15 0136 07/27/15 0500  BP: 171/74 174/72  Pulse: 89 77  Temp: 98.9 F (37.2 C) 99.1 F (37.3 C)  Resp: 18 18        Gen: In bed, NAD MS: alert and oriented, able to follow commands.  CN: PERRLA, EOMI, face symmetric, vision intact Motor: MAEW Sensory: intact throughout   Pertinent Labs: none  Felicie MornDavid Smith PA-C [facility] [phone]  Impression: 65 YO male with intractable HA. His ICP was high, of unclear significance but this is concerning. His headache was more abrupt than is typical of IIH. There was RBC in the CSF, however with that level of RBC and the duration since onset, there absolutely should be xanthrochromia if this were SAH, and the absence of this is more typical of a traumatic tap. I suspect that this is primary thunderclap headache which can be difficult to treat. Previous migraines(reports "hypertension related" headaches in the past") with severe attack is possible as well.   He has responded well to typical migraine treatments and I think that this represented a particularly severe migraine.   I did not see any clear papilledema, but would have him see an outpatient ophthamalogist given the elevated ICP, if no papilledema, then I would not pursue this further.   I would use naproxen PRN for headaches.   Recommendations: 1) Outpatient ophthamology appointment.  2) I have requested outpatient neurology follow up for headaches.  3) Please call with any further questions or concerns.   Ritta SlotMcNeill Jaeline Whobrey, MD [facility] [phone]  If 7pm- 7am, please page neurology on call as listed in AMION.

## 2015-07-27 NOTE — Discharge Summary (Addendum)
Physician Discharge Summary  Alexander Gonzalez MRN: [id number] DOB/AGE: 1950/10/11 65 y.o.  PCP: Kandice Hams, MD   Admit date: 07/19/2015 Discharge date: 07/27/2015  Discharge Diagnoses:     Principal Problem:   Severe headache Active Problems:   HTN (hypertension)   Hyperlipidemia   Abnormal computed tomography angiography of brain   CKD (chronic kidney disease), stage III   Acute hyperglycemia   Basilar artery stenosis   Hypertensive emergency   Occlusion and stenosis of basilar artery   Cephalalgia thunderclap headache vs hypertension related headache    Follow-up recommendations Follow-up with PCP in 3-5 days , including all  additional recommended appointments as below Follow-up CBC, CMP in 3-5 days Patient needs to follow-up with interventional radiology to further evaluate his basilar artery stenosis Patient is being discharged with home health RN for blood pressure monitoring Outpatient ophthamology appointment to evaluate for ICP , papilledema      Current Discharge Medication List    START taking these medications   Details  acetaminophen (TYLENOL) 500 MG tablet Take 2 tablets (1,000 mg total) by mouth every 6 (six) hours as needed for mild pain or headache. Qty: 30 tablet, Refills: 0    clonazePAM (KLONOPIN) 0.5 MG tablet Take 1 tablet (0.5 mg total) by mouth 2 (two) times daily. Qty: 30 tablet, Refills: 0    hydrALAZINE (APRESOLINE) 100 MG tablet Take 1 tablet (100 mg total) by mouth every 6 (six) hours. Qty: 120 tablet, Refills: 0    oxyCODONE (OXY IR/ROXICODONE) 5 MG immediate release tablet Take 1 tablet (5 mg total) by mouth every 6 (six) hours as needed (Headache and pain). Qty: 30 tablet, Refills: 0    pantoprazole (PROTONIX) 40 MG tablet Take 1 tablet (40 mg total) by mouth daily. Qty: 30 tablet, Refills: 1      CONTINUE these medications which have CHANGED   Details  losartan (COZAAR) 100 MG tablet Take 0.5 tablets (50 mg total) by mouth  daily. Qty: 30 tablet, Refills: 1    meclizine (ANTIVERT) 12.5 MG tablet Take 1 tablet (12.5 mg total) by mouth 2 (two) times daily as needed for dizziness. Qty: 30 tablet, Refills: 0      CONTINUE these medications which have NOT CHANGED   Details  amLODipine (NORVASC) 10 MG tablet Take 1 tablet (10 mg total) by mouth daily. Qty: 30 tablet, Refills: 3    aspirin EC 81 MG EC tablet Take 1 tablet (81 mg total) by mouth daily. Qty: 30 tablet, Refills: 0    simvastatin (ZOCOR) 40 MG tablet Take 0.5 tablets (20 mg total) by mouth every evening. Take 20 mg -half a tablet daily Qty: 30 tablet, Refills: 3         Discharge Condition:Stable  Discharge Instructions Get Medicines reviewed and adjusted: Please take all your medications with you for your next visit with your Primary MD  Please request your Primary MD to go over all hospital tests and procedure/radiological results at the follow up, please ask your Primary MD to get all Hospital records sent to his/her office.  If you experience worsening of your admission symptoms, develop shortness of breath, life threatening emergency, suicidal or homicidal thoughts you must seek medical attention immediately by calling 911 or calling your MD immediately if symptoms less severe.  You must read complete instructions/literature along with all the possible adverse reactions/side effects for all the Medicines you take and that have been prescribed to you. Take any new Medicines after you have completely understood  and accpet all the possible adverse reactions/side effects.   Do not drive when taking Pain medications.   Do not take more than prescribed Pain, Sleep and Anxiety Medications  Special Instructions: If you have smoked or chewed Tobacco in the last 2 yrs please stop smoking, stop any regular Alcohol and or any Recreational drug use.  Wear Seat belts while driving.  Please note  You were cared for by a hospitalist during your  hospital stay. Once you are discharged, your primary care physician will handle any further medical issues. Please note that NO REFILLS for any discharge medications will be authorized once you are discharged, as it is imperative that you return to your primary care physician (or establish a relationship with a primary care physician if you do not have one) for your aftercare needs so that they can reassess your need for medications and monitor your lab values.  Discharge Instructions    Diet - low sodium heart healthy    Complete by:  As directed      Increase activity slowly    Complete by:  As directed             No Known Allergies    Disposition: 01-Home or Self Care   Consults:  Neurology     Significant Diagnostic Studies:  Ct Angio Head W/cm &/or Wo Cm  07/19/2015  CLINICAL DATA:  66 year old male code stroke. Severe headache and weakness. Right side weakness. Initial encounter. EXAM: CT ANGIOGRAPHY HEAD AND NECK TECHNIQUE: Multidetector CT imaging of the head and neck was performed using the standard protocol during bolus administration of intravenous contrast. Multiplanar CT image reconstructions and MIPs were obtained to evaluate the vascular anatomy. Carotid stenosis measurements (when applicable) are obtained utilizing NASCET criteria, using the distal internal carotid diameter as the denominator. CONTRAST:  80 mL Isovue 370. COMPARISON:  Head CT without contrast 1123 hours today. Brain MRI 01/14/2015. FINDINGS: CTA NECK Skeleton: Intermittent poor dentition. Degenerative changes in the cervical and visualized thoracic spine. No acute osseous abnormality identified. Mild to moderate paranasal sinus mucosal thickening with chronic periosteal thickening suggesting chronic sinus disease. Other neck: Mediastinal lipomatosis. No superior mediastinal lymphadenopathy. Negative visualized lung apices aside from atelectasis. Negative thyroid, larynx, pharynx, parapharyngeal spaces,  retropharyngeal space, sublingual space, submandibular glands and parotid glands. No cervical lymphadenopathy. Aortic arch: Bovine arch configuration. Minimal to mild calcified arch atherosclerosis. No great vessel origin stenosis. Right carotid system: Mildly tortuous right CCA. Bulky soft and to a lesser extent calcified plaque at the right carotid bifurcation resulting in right ICA origin stenosis of 65 % with respect to the distal vessel. Otherwise negative cervical right ICA. Left carotid system: Tortuous proximal left CCA. Bovine type left CCA origin. At the left carotid bifurcation there is circumferential soft plaque at the left ICA origin, but less than 50 % stenosis with respect to the distal vessel results (series 9, image 119). Otherwise negative cervical left ICA. Vertebral arteries: No proximal right subclavian artery stenosis. Soft plaque with mild stenosis at the right vertebral artery origin (series 9, image 141). Dominant right vertebral artery otherwise is widely patent to the skullbase. No proximal left subclavian artery stenosis. Non dominant left vertebral artery with a normal origin. Tortuous left V1 segment. No left vertebral artery stenosis to the skullbase. CTA HEAD Posterior circulation: Dominant right vertebral artery with V4 segment calcified plaque resulting an mild to moderate stenosis. Right PICA origin is patent. Non dominant left vertebral artery terminates in the left  PICA. Calcified plaque at the basilar artery origin not resulting in stenosis, but there is widespread basilar artery irregularity related to soft atherosclerotic plaque such that a beaded appearance occurs throughout the basilar artery (series 12 images 27 and 28). No high-grade stenosis occurs. The basilar tip remains patent. SCA and MCA origins are patent, but there is moderate to severe stenosis at the left PCA origin. There is additional moderate tandem left P2 segment stenosis. Bilateral distal PCA branches  remain patent. Anterior circulation: Soft plaque in the left ICA at the skullbase resulting in up to 50 % stenosis with respect to the distal vessel. Left ICA siphon is patent with mild cavernous and moderate supraclinoid segment calcified plaque not resulting in stenosis. Patent left ICA terminus. Normal left ophthalmic artery origin. Mild cavernous and moderate supra clinoid right ICA calcified plaque without stenosis. Normal right ophthalmic artery origin. Patent right ICA terminus. Right MCA and ACA origins are within normal limits. Fenestrated anterior communicating artery is present. Bilateral A2 and distal ACA branches are within normal limits. Right MCA M1 segment, bifurcation, and right MCA branches are within normal limits. Left ACA origin is highly stenotic such that there is minimal if any flow in the left A1 segment. Still, the left A2 is adequately supplied by the anterior communicating artery. Left MCA origin, M1 segment, bifurcation, and left MCA branches are within normal limits. Venous sinuses: Patent. Anatomic variants: Dominant right vertebral artery. Left vertebral artery terminates in PICA. Bovine type arch configuration. IMPRESSION: 1. Negative for emergent large vessel occlusion. 2. Bulky soft plaque at the right carotid bifurcation resulting in 65% stenosis of the right ICA origin. 3. Severe intracranial basilar artery atherosclerosis, resulting in a beaded appearance of the vessel throughout its course, but no hemodynamically significant basilar artery stenosis. There is superimposed mild to moderate stenosis of the right vertebral artery V4 segment which supplies the basilar. The non dominant left vertebral artery terminates in the left PICA. 4. Severe stenosis at the left PCA origin with preserved distal flow. 5. Severe stenosis at the left ACA origin with adequately reconstituted A2 segment flow via the anterior communicating artery. 6. Other intracranial and extracranial atherosclerosis  is not hemodynamically significant. Electronically Signed   By: Genevie Ann M.D.   On: 07/19/2015 12:51   Dg Chest 2 View  07/20/2015  CLINICAL DATA:  Dry cough and severe headache yesterday, history hypertension EXAM: CHEST  2 VIEW COMPARISON:  None FINDINGS: Lordotic positioning with slight rotation to the RIGHT on AP view. Normal heart size, mediastinal contours and pulmonary vascularity. Elevation of RIGHT diaphragm with RIGHT basilar atelectasis. Lungs otherwise clear. No pleural effusion or pneumothorax. IMPRESSION: RIGHT basilar atelectasis. Electronically Signed   By: Lavonia Dana M.D.   On: 07/20/2015 11:16   Ct Angio Neck W/cm &/or Wo/cm  07/19/2015  CLINICAL DATA:  65 year old male code stroke. Severe headache and weakness. Right side weakness. Initial encounter. EXAM: CT ANGIOGRAPHY HEAD AND NECK TECHNIQUE: Multidetector CT imaging of the head and neck was performed using the standard protocol during bolus administration of intravenous contrast. Multiplanar CT image reconstructions and MIPs were obtained to evaluate the vascular anatomy. Carotid stenosis measurements (when applicable) are obtained utilizing NASCET criteria, using the distal internal carotid diameter as the denominator. CONTRAST:  80 mL Isovue 370. COMPARISON:  Head CT without contrast 1123 hours today. Brain MRI 01/14/2015. FINDINGS: CTA NECK Skeleton: Intermittent poor dentition. Degenerative changes in the cervical and visualized thoracic spine. No acute osseous abnormality identified. Mild  to moderate paranasal sinus mucosal thickening with chronic periosteal thickening suggesting chronic sinus disease. Other neck: Mediastinal lipomatosis. No superior mediastinal lymphadenopathy. Negative visualized lung apices aside from atelectasis. Negative thyroid, larynx, pharynx, parapharyngeal spaces, retropharyngeal space, sublingual space, submandibular glands and parotid glands. No cervical lymphadenopathy. Aortic arch: Bovine arch  configuration. Minimal to mild calcified arch atherosclerosis. No great vessel origin stenosis. Right carotid system: Mildly tortuous right CCA. Bulky soft and to a lesser extent calcified plaque at the right carotid bifurcation resulting in right ICA origin stenosis of 65 % with respect to the distal vessel. Otherwise negative cervical right ICA. Left carotid system: Tortuous proximal left CCA. Bovine type left CCA origin. At the left carotid bifurcation there is circumferential soft plaque at the left ICA origin, but less than 50 % stenosis with respect to the distal vessel results (series 9, image 119). Otherwise negative cervical left ICA. Vertebral arteries: No proximal right subclavian artery stenosis. Soft plaque with mild stenosis at the right vertebral artery origin (series 9, image 141). Dominant right vertebral artery otherwise is widely patent to the skullbase. No proximal left subclavian artery stenosis. Non dominant left vertebral artery with a normal origin. Tortuous left V1 segment. No left vertebral artery stenosis to the skullbase. CTA HEAD Posterior circulation: Dominant right vertebral artery with V4 segment calcified plaque resulting an mild to moderate stenosis. Right PICA origin is patent. Non dominant left vertebral artery terminates in the left PICA. Calcified plaque at the basilar artery origin not resulting in stenosis, but there is widespread basilar artery irregularity related to soft atherosclerotic plaque such that a beaded appearance occurs throughout the basilar artery (series 12 images 27 and 28). No high-grade stenosis occurs. The basilar tip remains patent. SCA and MCA origins are patent, but there is moderate to severe stenosis at the left PCA origin. There is additional moderate tandem left P2 segment stenosis. Bilateral distal PCA branches remain patent. Anterior circulation: Soft plaque in the left ICA at the skullbase resulting in up to 50 % stenosis with respect to the  distal vessel. Left ICA siphon is patent with mild cavernous and moderate supraclinoid segment calcified plaque not resulting in stenosis. Patent left ICA terminus. Normal left ophthalmic artery origin. Mild cavernous and moderate supra clinoid right ICA calcified plaque without stenosis. Normal right ophthalmic artery origin. Patent right ICA terminus. Right MCA and ACA origins are within normal limits. Fenestrated anterior communicating artery is present. Bilateral A2 and distal ACA branches are within normal limits. Right MCA M1 segment, bifurcation, and right MCA branches are within normal limits. Left ACA origin is highly stenotic such that there is minimal if any flow in the left A1 segment. Still, the left A2 is adequately supplied by the anterior communicating artery. Left MCA origin, M1 segment, bifurcation, and left MCA branches are within normal limits. Venous sinuses: Patent. Anatomic variants: Dominant right vertebral artery. Left vertebral artery terminates in PICA. Bovine type arch configuration. IMPRESSION: 1. Negative for emergent large vessel occlusion. 2. Bulky soft plaque at the right carotid bifurcation resulting in 65% stenosis of the right ICA origin. 3. Severe intracranial basilar artery atherosclerosis, resulting in a beaded appearance of the vessel throughout its course, but no hemodynamically significant basilar artery stenosis. There is superimposed mild to moderate stenosis of the right vertebral artery V4 segment which supplies the basilar. The non dominant left vertebral artery terminates in the left PICA. 4. Severe stenosis at the left PCA origin with preserved distal flow. 5. Severe stenosis at  the left ACA origin with adequately reconstituted A2 segment flow via the anterior communicating artery. 6. Other intracranial and extracranial atherosclerosis is not hemodynamically significant. Electronically Signed   By: Genevie Ann M.D.   On: 07/19/2015 12:51   Mr Jeri Cos NW  Contrast  07/21/2015  CLINICAL DATA:  65 year old male with headache and right side weakness. Initial encounter. EXAM: MRI HEAD WITHOUT AND WITH CONTRAST MRV HEAD WITHOUT CONTRAST TECHNIQUE: Multiplanar, multiecho pulse sequences of the brain and surrounding structures were obtained without and with intravenous contrast. Angiographic images of the head were obtained using MRV technique without contrast. CONTRAST:  20 mL MultiHance COMPARISON:  CTA head and neck 07/19/2015.  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 No restricted diffusion or evidence of acute infarction. No midline shift, mass effect, evidence of mass lesion, ventriculomegaly, extra-axial collection or acute intracranial hemorrhage. Cervicomedullary junction and pituitary are within normal limits. Stable mild for age nonspecific cerebral white matter T2 and FLAIR hyperintensity, slightly greater in the left hemisphere. No cortical encephalomalacia or chronic cerebral blood products. Deep gray matter nuclei, brainstem, and cerebellum are normal for age. No abnormal enhancement identified. No dural thickening. Visible internal auditory structures appear normal. Mastoids are clear. Mild paranasal sinus mucosal thickening. Negative orbit and scalp soft tissues. Negative visualized cervical spine. Normal bone marrow signal. MRV HEAD FINDINGS Preserved flow signal in the superior sagittal sinus, the torcula, both transverse sinuses (the left is dominant) both sigmoid sinuses, and both IJ bulbs. Preserved flow signal in the straight sinus, vein of Galen, internal cerebral veins, and basal veins of Rosenthal. Flow signal in the major cortical veins appears preserved including the Trolard and veins of Labbe. IMPRESSION: 1. Normal intracranial  MRV. 2. No acute intracranial abnormality. Stable MRI appearance of the brain since 2016. 3. 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 Electronically Signed   By: Genevie Ann M.D.   On: 07/21/2015 12:02   Mr Mrv Head Wo Cm  07/21/2015  CLINICAL DATA:  65 year old male with headache and right side weakness. Initial encounter. EXAM: MRI HEAD WITHOUT AND WITH CONTRAST MRV HEAD WITHOUT CONTRAST TECHNIQUE: Multiplanar, multiecho pulse sequences of the brain and surrounding structures were obtained without and with intravenous contrast. Angiographic images of the head were obtained using MRV technique without contrast. CONTRAST:  20 mL MultiHance COMPARISON:  CTA head and neck 07/19/2015.  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 No restricted diffusion or evidence of acute infarction. No midline shift, mass effect, evidence of mass lesion, ventriculomegaly, extra-axial collection or acute intracranial hemorrhage. Cervicomedullary junction and pituitary are within normal limits. Stable mild for age nonspecific cerebral white matter T2 and FLAIR hyperintensity, slightly greater in the left hemisphere. No cortical encephalomalacia or chronic cerebral blood products. Deep gray matter nuclei, brainstem, and cerebellum are normal for age. No abnormal enhancement identified. No dural thickening. Visible internal auditory structures appear normal. Mastoids are clear. Mild paranasal sinus mucosal thickening. Negative orbit and scalp soft tissues. Negative visualized cervical spine. Normal bone marrow signal. MRV HEAD FINDINGS Preserved  flow signal in the superior sagittal sinus, the torcula, both  transverse sinuses (the left is dominant) both sigmoid sinuses, and both IJ bulbs. Preserved flow signal in the straight sinus, vein of Galen, internal cerebral veins, and basal veins of Rosenthal. Flow signal in the major cortical veins appears preserved including the Trolard and veins of Labbe. IMPRESSION: 1. Normal intracranial MRV. 2. No acute intracranial abnormality. Stable MRI appearance of the brain since 2016. 3. 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 Electronically Signed   By: Genevie Ann M.D.   On: 07/21/2015 12:02   Ct Head Code Stroke W/o Cm  07/19/2015  CLINICAL DATA:  Severe headache and right-sided weakness today. EXAM: CT HEAD WITHOUT CONTRAST TECHNIQUE: Contiguous axial images were obtained from the base of the skull through the vertex without intravenous contrast. COMPARISON:  Head CT 01/14/2015 and MRI brain 01/14/2015 FINDINGS: Stable age related cerebral atrophy, ventriculomegaly and periventricular white matter disease. No findings for acute hemispheric infarction an or intracranial hemorrhage. No extra-axial fluid collections are identified. No mass lesions. The brainstem and cerebellum are grossly normal in stable. Vascular calcifications are noted. No acute bony findings. Extensive mucoperiosteal thickening involving the maxillary and ethmoid sinuses bilaterally. The mastoid air cells and middle ear cavities are clear. The globes are intact. IMPRESSION: No acute intracranial findings. Specifically, no findings for hemispheric infarction or intracranial hemorrhage. Stable age related cerebral atrophy and mild ventriculomegaly. Electronically Signed   By: Marijo Sanes M.D.   On: 07/19/2015 11:29   Dg Fluoro Guide Lumbar Puncture  07/24/2015  CLINICAL DATA:  Headache. EXAM: DIAGNOSTIC LUMBAR PUNCTURE UNDER FLUOROSCOPIC GUIDANCE FLUOROSCOPY TIME:  If the device does not  provide the exposure index: Fluoroscopy Time (in minutes and seconds):  1 minutes and 24 seconds PROCEDURE: Informed consent was obtained from the patient's wife prior to the procedure, including potential complications of headache, allergy, and pain. With the patient prone, the lower back was prepped with Betadine. 1% Lidocaine was used for local anesthesia. Lumbar puncture was performed at the L3-4 level using a 20 gauge needle with return of pink/sanguinous CSF with an opening pressure of 33 cm water. 6.5 ml of CSF were obtained for laboratory studies. The patient tolerated the procedure well and there were no apparent complications. IMPRESSION: Uncomplicated lumbar puncture, as above. Electronically Signed   By: Abigail Miyamoto M.D.   On: 07/24/2015 14:13    2-D echo LV EF: 65% - 70%  ------------------------------------------------------------------- History: PMH: Severe headache with transient right side weakness. Basilar circulation stenosis-long standing hypertension. Risk factors: Chronic kidney disease. Hypertension. Dyslipidemia.  ------------------------------------------------------------------- Study Conclusions  - Left ventricle: The cavity size was normal. Systolic function was  vigorous. The estimated ejection fraction was in the range of 65%  to 70%. Wall motion was normal; there were no regional wall  motion abnormalities. Features are consistent with a pseudonormal  left ventricular filling pattern, with concomitant abnormal  relaxation and increased filling pressure (grade 2 diastolic  dysfunction).    Filed Weights   07/19/15 1141  Weight: 124.9 kg (275 lb 5.7 oz)     Microbiology: Recent Results (from the past 240 hour(s))  CSF culture     Status: None (Preliminary result)   Collection Time: 07/24/15  2:02 PM  Result Value Ref Range Status   Specimen Description CSF  Final   Special Requests NONE  Final   Gram Stain NO  WBC SEEN NO ORGANISMS  SEEN   Final   Culture NO GROWTH 3 DAYS  Final   Report Status PENDING  Incomplete  Fungus Culture With Stain (Not @ [facility])     Status: None (Preliminary result)   Collection Time: 07/24/15  2:02 PM  Result Value Ref Range Status   Fungus Stain Final report  Final    Comment: (NOTE) Performed At: [facility] [place], [place] [id number] Lindon Romp MD [id number]    Fungus (Mycology) Culture PENDING  Incomplete   Fungal Source CSF  Final  Fungus Culture Result     Status: None   Collection Time: 07/24/15  2:02 PM  Result Value Ref Range Status   Result 1 Comment  Final    Comment: (NOTE) KOH/Calcofluor preparation:  no fungus observed. Performed At: [facility] [address] [place], [place] [id number] Lindon Romp MD KP:[id number]        Blood Culture    Component Value Date/Time   SDES CSF 07/24/2015 1402   SPECREQUEST NONE 07/24/2015 1402   CULT NO GROWTH 3 DAYS 07/24/2015 1402   REPTSTATUS PENDING 07/24/2015 1402      Labs: Results for orders placed or performed during the hospital encounter of 07/19/15 (from the past 48 hour(s))  Glucose, capillary     Status: None   Collection Time: 07/25/15 11:28 AM  Result Value Ref Range   Glucose-Capillary 95 65 - 99 mg/dL  Sedimentation rate     Status: None   Collection Time: 07/25/15 12:21 PM  Result Value Ref Range   Sed Rate 7 0 - 16 mm/hr  C-reactive protein     Status: None   Collection Time: 07/25/15 12:21 PM  Result Value Ref Range   CRP 0.7 <1.0 mg/dL  B. burgdorfi antibodies     Status: None   Collection Time: 07/25/15 12:21 PM  Result Value Ref Range   B burgdorferi Ab IgG+IgM <0.91 0.00 - 0.90 ISR    Comment: (NOTE)                                Negative         <0.91                                Equivocal  0.91 - 1.09                                Positive         >1.09 Performed At: [facility] [address] [place], [place]  [id number] Lindon Romp MD EO:[id number]   Glucose, capillary     Status: None   Collection Time: 07/25/15  4:49 PM  Result Value Ref Range   Glucose-Capillary 84 65 - 99 mg/dL  Glucose, capillary     Status: None   Collection Time: 07/25/15  9:13 PM  Result Value Ref Range   Glucose-Capillary 90 65 - 99 mg/dL  Glucose, capillary     Status: None   Collection Time: 07/26/15  6:44 AM  Result Value Ref Range   Glucose-Capillary 89 65 - 99 mg/dL   Comment 1 Notify RN    Comment 2 Document in Chart   Basic metabolic panel     Status: Abnormal  Collection Time: 07/26/15  6:51 AM  Result Value Ref Range   Sodium 139 135 - 145 mmol/L   Potassium 3.9 3.5 - 5.1 mmol/L   Chloride 103 101 - 111 mmol/L   CO2 28 22 - 32 mmol/L   Glucose, Bld 95 65 - 99 mg/dL   BUN 27 (H) 6 - 20 mg/dL   Creatinine, Ser 1.23 0.61 - 1.24 mg/dL   Calcium 8.1 (L) 8.9 - 10.3 mg/dL   GFR calc non Af Amer 60 (L) >60 mL/min   GFR calc Af Amer >60 >60 mL/min    Comment: (NOTE) The eGFR has been calculated using the CKD EPI equation. This calculation has not been validated in all clinical situations. eGFR's persistently <60 mL/min signify possible Chronic Kidney Disease.    Anion gap 8 5 - 15  CBC     Status: Abnormal   Collection Time: 07/26/15  6:51 AM  Result Value Ref Range   WBC 6.5 4.0 - 10.5 K/uL   RBC 4.53 4.22 - 5.81 MIL/uL   Hemoglobin 12.8 (L) 13.0 - 17.0 g/dL   HCT 39.7 39.0 - 52.0 %   MCV 87.6 78.0 - 100.0 fL   MCH 28.3 26.0 - 34.0 pg   MCHC 32.2 30.0 - 36.0 g/dL   RDW 13.8 11.5 - 15.5 %   Platelets 171 150 - 400 K/uL  Glucose, capillary     Status: None   Collection Time: 07/26/15 11:44 AM  Result Value Ref Range   Glucose-Capillary 96 65 - 99 mg/dL  Glucose, capillary     Status: Abnormal   Collection Time: 07/26/15  4:29 PM  Result Value Ref Range   Glucose-Capillary 122 (H) 65 - 99 mg/dL  Glucose, capillary     Status: Abnormal   Collection Time: 07/26/15 10:01 PM  Result  Value Ref Range   Glucose-Capillary 144 (H) 65 - 99 mg/dL   Comment 1 Notify RN    Comment 2 Document in Chart   Glucose, capillary     Status: Abnormal   Collection Time: 07/27/15  6:34 AM  Result Value Ref Range   Glucose-Capillary 100 (H) 65 - 99 mg/dL   Comment 1 Notify RN    Comment 2 Document in Chart      Lipid Panel     Component Value Date/Time   CHOL 200 07/20/2015 0605   TRIG 116 07/20/2015 0605   HDL 55 07/20/2015 0605   CHOLHDL 3.6 07/20/2015 0605   VLDL 23 07/20/2015 0605   LDLCALC 122* 07/20/2015 0605     Lab Results  Component Value Date   HGBA1C 6.1* 07/20/2015   HGBA1C 5.9* 01/15/2015     Lab Results  Component Value Date   LDLCALC 122* 07/20/2015   CREATININE 1.23 07/26/2015     HPI : 65 y.o. male patient who was brought into the emergency room for evaluation of acute onset of severe headache at this morning. Per EMS, patient reported having severe headache around 9:45 AM. When that the tumor. He initially appeared to have some right-sided weakness and stroke was called in the field. But en route they reported that his weakness is resolved. Patient unable to provide any history is in severe distress from headache has been holding his head and crying because of the pain. Not cooperative for any further history repair multiple forms for exam. No family at bedside. His blood pressure is elevated to 438 systolic.  Review of EMR revealed that he was in the  hospital in October 2016 with severe headache had a brain MRI done at that time which was negative. Patient found to have abnormal basilar artery circulation. IR had scheduled to perform arteriogram with possible intervention, this was canceled due to intractable headache and he recommended this to be done as an outpatient. Neuro and IR following. Patient is status post LP 4/25   HOSPITAL COURSE:  Severe headache/ Abnormal computed tomography angiography of brain -Patient presented with severe headache  transient right sided weakness (primarily hand) and elevated blood pressure and reported compliance with medications-imaging reveals basilar circulatory abnormalities-CT head without acute stroke and NIHSS was 0 Neurology and interventional radiology consulted -IR recommended cerebral arteriogram with possibility of pursuing intracranial vascular intervention if indicated (concern over possible aneurysm), 4/21, however unable to be completed due to intractable headache , IR to evaluate as an outpatient Headache management per neurology, doubt  idiopathic intracranial hypertension, doubt subarachnoid hemorrhage MRI, MRV for severe headache was normal no intracranial abnormality, stable MRI since 2016 IR plans to do angiogram as outpatient LP was attempted at bedside today by  neurology to rule out any other causes for this HA such as SAH or infections, although CT brain, CTA head and neck, brain MRI w/wo, and MRV studies are all negative, no fevers or any other clinical signs or lab abnormalities to suspect infections. Bedside LP unsuccessful therefore the patient had IR guided LP on 4/25 Consistent with a traumatic tap, negative for meningitis Initially headache was treated extensively with Depacon, Solu-Medrol, baclofen, subsequently treated with  Compazine/benadryl/toradol CSF analysis shows mildly elevated glucose, follow CSF fluid culture, fungal culture, VDRL, ESR, CRP Within normal limits, Lyme titers pending Headache improved after receiving Compazine/benadryl/toradol.  Continue aggressive control of hypertension   HTN (hypertension), improved Continue Norvasc, hydralazine, held Cozaar due to increasing creatinine, presumed at half the dose 2-D echo results LV EF: 65% - 70%    Acute kidney injury, CKD (chronic kidney disease), stage III, baseline 1.4, creatinine peaked at 1.8, currently 1.23 -Likely related to hypertension, prerenal Restart Cozaar, and monitor renal function  closely    Hyperlipidemia -Continue preadmission Zocor   Acute hyperglycemia Hemoglobin A1c 6.1  Chest pain , noncardiac  Like esophageal spasma, EKG unchanged  Cardiac enzymes negative D-dimer negative     Discharge Exam:  Blood pressure 174/72, pulse 77, temperature 99.1 F (37.3 C), temperature source Oral, resp. rate 18, weight 124.9 kg (275 lb 5.7 oz), SpO2 93 %.  Gen: In bed, NAD MS: alert, answers question and follows commands.  CN: PERRLA, EOMI, face symmetric, vision intact Motor: MAEW Sensory: intact throughout    Follow-up Information    Follow up with DEVESHWAR, Fritz Pickerel, MD.   Specialty:  Interventional Radiology   Why:  Anderson Malta will call you with details   Contact information:   [address] [place]  [address] [phone]       Follow up with Kandice Hams, MD. Schedule an appointment as soon as possible for a visit in 3 days.   Specialty:  Internal Medicine   Contact information:   [address] [place]  [address] [phone]       Signed: Reyne Dumas 07/27/2015, 10:29 AM        Time spent >45 mins

## 2015-07-27 NOTE — Progress Notes (Signed)
Discharge instructions reviewed with patient and spouse. No further questions at this time. Patient is transported to family vehicle. 

## 2015-08-07 ENCOUNTER — Other Ambulatory Visit (HOSPITAL_COMMUNITY): Payer: Self-pay | Admitting: Interventional Radiology

## 2015-08-07 DIAGNOSIS — R519 Headache, unspecified: Secondary | ICD-10-CM

## 2015-08-07 DIAGNOSIS — R51 Headache: Principal | ICD-10-CM

## 2015-08-09 LAB — B. BURGDORFI ANTIBODIES, CSF
ALBUMIN CSF: 24.1 mg/dL (ref 10.4–43.2)
ALBUMIN INDEX RATIO: 7.1
Albumin Serum: 3400 mg/dl — ABNORMAL LOW (ref 3700–5100)
B. BURGDORFERI IGM SERUM: 0.13 {index} (ref 0.00–0.80)
B. Burgdorferi IgG Serum: <0.2 Index (ref 0.00–0.80)
B. burgdorferi IgG CSF: <0.08 Index (ref 0.00–0.09)
B. burgdorferi IgM CSF: 0.08 Index — ABNORMAL HIGH (ref 0.00–0.06)
CNS IgG Synthesis Rate: 10.2
IGG (LOC): 1.207
IgG Index: 0.81
IgG Total CSF: 6.3 mg/dl — ABNORMAL HIGH (ref 0.00–3.06)
IgG Total Serum: 1095 mg/dl (ref 540–1423)
IgM Total Serum: 38 mg/dl — ABNORMAL LOW (ref 52–217)

## 2015-08-23 LAB — FUNGUS CULTURE WITH STAIN

## 2015-08-23 LAB — FUNGUS CULTURE RESULT

## 2015-08-23 LAB — FUNGAL ORGANISM REFLEX

## 2015-08-24 ENCOUNTER — Ambulatory Visit (INDEPENDENT_AMBULATORY_CARE_PROVIDER_SITE_OTHER): Payer: Medicare HMO | Admitting: Neurology

## 2015-08-24 ENCOUNTER — Encounter: Payer: Self-pay | Admitting: Neurology

## 2015-08-24 ENCOUNTER — Ambulatory Visit (HOSPITAL_COMMUNITY): Payer: Managed Care, Other (non HMO)

## 2015-08-24 VITALS — BP 160/90 | HR 81 | Ht 76.0 in | Wt 267.4 lb

## 2015-08-24 DIAGNOSIS — H8110 Benign paroxysmal vertigo, unspecified ear: Secondary | ICD-10-CM

## 2015-08-24 DIAGNOSIS — G4453 Primary thunderclap headache: Secondary | ICD-10-CM

## 2015-08-24 MED ORDER — MECLIZINE HCL 25 MG PO TABS: 25.0000 mg | ORAL_TABLET | Freq: Two times a day (BID) | ORAL | Status: DC | PRN

## 2015-08-24 NOTE — Progress Notes (Signed)
[facility] Note - Initial Visit   Date: 08/24/2015  [name] MRN: [id number] DOB: 1950/05/26   Dear Dr. Delfina Redwood:  Thank you for your kind referral of Alexander Gonzalez for consultation of headaches. Although his history is well known to you, please allow [place] to reiterate it for the purpose of our medical record. The patient was accompanied to the clinic by self.   History of Present Illness: Alexander Gonzalez is a 65 y.o. left-handed African American male with hypertension and hyperlipidemia presenting for hospital discharge follow-up for headaches.   He is a retired Engineer, structural.   On April 20th, he woke up with severe excruciating holocephalic headache.  It was exacerbated by any movement making it impossible to even get out of bed.  His wife called EMS and he was transported to [facility]  Because of some transient right sided transient weakness, he was evaluated for acute stroke with CT brain, which did not show any acute bleed or ischemic changes. SBP noted to be 199.  CTA showed 65% right ICA stenosis due to plaque and severe intracranial atherosclerosis involving the basilar artery, left PCA, and left ACA.  There were plans for him to have cerebral angiogram, however due to the severity of his headache, it was decided to pursue this as an out-patient.  Epic review shows that he has an appointment today, but patient is unaware of this appointment time and date.   He takes aspirin 51m daily and is complaint with his medications.    The severity of his pain was concerning, so patient also underwent CSF testing.  The only notable findings was mild elevation in blood glucose and elevated opening pressure at 33, but MRI brain and MRV was normal.  Headaches with treated with multiple medication regimens including Depacon, Solu-Medrol, baclofen, subsequently treated withcompazine/benadryl/toradol.  Slowly, headaches improved and he was discharged  home.    Since being home, he has not had any headache.  He does complain of dizziness, described as room-spinning, especially with head turning to the left.  Symptoms last about 10-seconds and self-resolve.  In 1998, he reports being told he had a TIA.  Symptoms were described as binocular vision loss lasting several hours.  Interestingly, this only occurred when sitting up, if he was laying supine, he was able to see the ceiling.  He was evaluated in the ER and given hydration.    Out-side paper records, electronic medical record, and images have been reviewed where available and summarized as:  CT head wo contrast 07/19/2015: No acute intracranial findings. Specifically, no findings for hemispheric infarction or intracranial hemorrhage. Stable age related cerebral atrophy and mild ventriculomegaly.  CTA head and neck 07/19/2015: 1. Negative for emergent large vessel occlusion. 2. Bulky soft plaque at the right carotid bifurcation resulting in 65% stenosis of the right ICA origin. 3. Severe intracranial basilar artery atherosclerosis, resulting in a beaded appearance of the vessel throughout its course, but no hemodynamically significant basilar artery stenosis. There is superimposed mild to moderate stenosis of the right vertebral artery V4 segment which supplies the basilar. The non dominant left vertebral artery terminates in the left PICA. 4. Severe stenosis at the left PCA origin with preserved distal flow. 5. Severe stenosis at the left ACA origin with adequately reconstituted A2 segment flow via the anterior communicating artery. 6. Other intracranial and extracranial atherosclerosis is not hemodynamically significant.  MRI/V head wwo contrast 07/21/2015: 1. Normal intracranial MRV. 2. No acute intracranial abnormality. Stable  MRI appearance of the brain since 2016. 3. MRI images demonstrate that the left vertebral artery rather than anatomically terminating in the PICA, rather is  chronically occluded beyond the left PICA.  CSF analysis 07/25/2015: [id number]  W9 P45  G77*  VDRL neg, Lyme neg, fungal cultures neg  OP 33* Labs 07/25/2015:  CRP 0.7, ESR 7  Past Medical History  Diagnosis Date  . Hypertension   . Osteoarthritis   . Mini stroke ([place]) 1996  . Hyperlipidemia     Past Surgical History  Procedure Laterality Date  . Knee surgery  1976    right  . Replacement total knee  1996    left  . Knee surgery  0350'0    right     Medications:  Outpatient Encounter Prescriptions as of 08/24/2015  Medication Sig Note  . acetaminophen (TYLENOL) 500 MG tablet Take 2 tablets (1,000 mg total) by mouth every 6 (six) hours as needed for mild pain or headache.   Marland Kitchen amLODipine (NORVASC) 10 MG tablet Take 1 tablet (10 mg total) by mouth daily.   Marland Kitchen aspirin EC 81 MG EC tablet Take 1 tablet (81 mg total) by mouth daily. 07/19/2015: Pt took 136m this am  . clonazePAM (KLONOPIN) 0.5 MG tablet Take 1 tablet (0.5 mg total) by mouth 2 (two) times daily.   . hydrALAZINE (APRESOLINE) 100 MG tablet Take 1 tablet (100 mg total) by mouth every 6 (six) hours.   .Marland Kitchenlosartan (COZAAR) 100 MG tablet Take 0.5 tablets (50 mg total) by mouth daily.   . meclizine (ANTIVERT) 12.5 MG tablet Take 1 tablet (12.5 mg total) by mouth 2 (two) times daily as needed for dizziness.   .Marland KitchenoxyCODONE (OXY IR/ROXICODONE) 5 MG immediate release tablet Take 1 tablet (5 mg total) by mouth every 6 (six) hours as needed (Headache and pain).   . pantoprazole (PROTONIX) 40 MG tablet Take 1 tablet (40 mg total) by mouth daily.   . simvastatin (ZOCOR) 40 MG tablet Take 0.5 tablets (20 mg total) by mouth every evening. Take 20 mg -half a tablet daily    No facility-administered encounter medications on file as of 08/24/2015.     Allergies: No Known Allergies  Family History: Family History  Problem Relation Age of Onset  . Cancer Mother     Deceased, 873 . Hypertension Mother   . Heart attack Father     Deceased    . Diabetes Brother     Deceased  . Autism Son   . Healthy Daughter     Social History: Social History  Substance Use Topics  . Smoking status: Never Smoker   . Smokeless tobacco: Never Used  . Alcohol Use: 0.0 oz/week    0 Standard drinks or equivalent per week     Comment: occas.   Social History   Social History Narrative   Lives with wife and 5 kids in a 2 story home.  [place]     Education: 12th grade.    Review of Systems:  CONSTITUTIONAL: No fevers, chills, night sweats, or weight loss.   EYES: No visual changes or eye pain ENT: No hearing changes.  No history of nose bleeds.   RESPIRATORY: No cough, wheezing and shortness of breath.   CARDIOVASCULAR: Negative for chest pain, and palpitations.   GI: Negative for abdominal discomfort, blood in stools or black stools.  No recent change in bowel habits.   GU:  No history of incontinence.  MUSCLOSKELETAL: +history of joint pain or swelling.  No myalgias.   SKIN: Negative for lesions, rash, and itching.   HEMATOLOGY/ONCOLOGY: Negative for prolonged bleeding, bruising easily, and swollen nodes.  No history of cancer.   ENDOCRINE: Negative for cold or heat intolerance, polydipsia or goiter.   PSYCH:  No depression or anxiety symptoms.   NEURO: As Above.   Vital Signs:  BP 160/90 mmHg  Pulse 81  Ht 6' 4" (1.93 m)  Wt 267 lb 6 oz (121.281 kg)  BMI 32.56 kg/m2  SpO2 98%   General Medical Exam:   General:  Well appearing, comfortable.   Eyes/ENT: see cranial nerve examination.   Neck: No masses appreciated.  Full range of motion without tenderness.  No carotid bruits. Respiratory:  Clear to auscultation, good air entry bilaterally.   Cardiac:  Regular rate and rhythm, no murmur.   Extremities:  No deformities, edema, or skin discoloration.  Skin:  No rashes or lesions.  Neurological Exam: MENTAL STATUS including orientation to time, place, person, recent and remote memory, attention  span and concentration, language, and fund of knowledge is normal.  Speech is not dysarthric.  CRANIAL NERVES: II:  No visual field defects.  Unremarkable fundi.   III-IV-VI: Pupils equal round and reactive to light.  Normal conjugate, extra-ocular eye movements in all directions of gaze.  No nystagmus.  No ptosis.   V:  Normal facial sensation.     VII:  Normal facial symmetry and movements.  No pathologic facial reflexes.  VIII:  Normal hearing and vestibular function.  Head thrust negative.  IX-X:  Normal palatal movement.   XI:  Normal shoulder shrug and head rotation.   XII:  Normal tongue strength and range of motion, no deviation or fasciculation.  MOTOR:  No atrophy, fasciculations or abnormal movements.  No pronator drift.  Tone is normal.    Right Upper Extremity:    Left Upper Extremity:    Deltoid  5/5   Deltoid  5/5   Biceps  5/5   Biceps  5/5   Triceps  5/5   Triceps  5/5   Wrist extensors  5/5   Wrist extensors  5/5   Wrist flexors  5/5   Wrist flexors  5/5   Finger extensors  5/5   Finger extensors  5/5   Finger flexors  5/5   Finger flexors  5/5   Dorsal interossei  5/5   Dorsal interossei  5/5   Abductor pollicis  5/5   Abductor pollicis  5/5   Tone (Ashworth scale)  0  Tone (Ashworth scale)  0   Right Lower Extremity:    Left Lower Extremity:    Hip flexors  5/5   Hip flexors  5/5   Hip extensors  5/5   Hip extensors  5/5   Knee flexors  5/5   Knee flexors  5/5   Knee extensors  5/5   Knee extensors  5/5   Dorsiflexors  5/5   Dorsiflexors  5/5   Plantarflexors  5/5   Plantarflexors  5/5   Toe extensors  5/5   Toe extensors  5/5   Toe flexors  5/5   Toe flexors  5/5   Tone (Ashworth scale)  0  Tone (Ashworth scale)  0   MSRs:  Right  Left brachioradialis 2+  brachioradialis 2+  biceps 2+  biceps 2+  triceps 2+  triceps 2+  patellar 2+  patellar 2+  ankle jerk 2+  ankle jerk 2+  Hoffman no   Hoffman no  plantar response down  plantar response down   SENSORY:  Normal and symmetric perception of light touch, pinprick, vibration, and proprioception.  Romberg's sign absent.   COORDINATION/GAIT: Normal finger-to- nose-finger and heel-to-shin.  Intact rapid alternating movements bilaterally.  Able to rise from a chair without using arms.  Gait narrow based and stable. Unsteady with tandem gait.   IMPRESSION: 1.  Benign positional vertigo  - Increase meclizine to 41m daily  - Start vestibular therapy  2.  Thunderclap headaches, now resolved  - Extensive work-up including MRI brain, MRV, and CSF testing.  Unclear why his OP was 33 as the severity of his pain is atypical for pseudotumor cerebri.    - Discussed that hypertensive emergency can cause severe headaches, but severe pain can also cause marked elevation in blood pressure, so difficult to determine etiology.  There was no evidence of vasoconstriction on imaging to suggest RCVS  - Follow clinically, encouraged tight BP control  3.  Intracranial atherosclerosis without acute stroke.  His tandem basilar atherosclerosis is of concern, but there is good distal flow, so recommend continued medical management  - Continue monotherapy with aspirin 839m statin therapy, and BP control  - He will be seeing Dr. DeEstanislado Pandyor cerebral angriogram to better assess his vessel status  Return to clinic in 2-3 months  The duration of this appointment visit was 60 minutes of face-to-face time with the patient.  Greater than 50% of this time was spent in counseling, explanation of diagnosis, planning of further management, and coordination of care.   Thank you for allowing me to participate in patient's care.  If I can answer any additional questions, I would be pleased to do so.    Sincerely,    Yanelie Abraha K. PaPosey ProntoDO

## 2015-08-24 NOTE — Patient Instructions (Addendum)
Start vestibular therapy Continue aspirin 81mg  and simvastatin 40mg  daily Increase meclizine to 25mg  twice daily as needed for dizziness  Return to clinic in 2-3 month

## 2015-08-28 NOTE — Progress Notes (Signed)
Note routed

## 2015-08-29 ENCOUNTER — Telehealth (HOSPITAL_COMMUNITY): Payer: Self-pay

## 2015-08-29 NOTE — Telephone Encounter (Signed)
Called to reschedule consult with Dr. Corliss Skainseveshwar, left vm for pt to call back. AW

## 2015-09-21 ENCOUNTER — Ambulatory Visit (HOSPITAL_COMMUNITY)
Admission: RE | Admit: 2015-09-21 | Discharge: 2015-09-21 | Disposition: A | Discharge status: Home / Self Care | Payer: Medicare HMO | Source: Ambulatory Visit | Attending: Interventional Radiology | Admitting: Interventional Radiology

## 2015-09-21 DIAGNOSIS — R51 Headache: Principal | ICD-10-CM

## 2015-09-21 DIAGNOSIS — R519 Headache, unspecified: Secondary | ICD-10-CM

## 2015-09-21 DIAGNOSIS — G450 Vertebro-basilar artery syndrome: Secondary | ICD-10-CM | POA: Diagnosis not present

## 2015-09-25 ENCOUNTER — Telehealth (HOSPITAL_COMMUNITY): Payer: Self-pay

## 2015-09-25 NOTE — Telephone Encounter (Signed)
Called to schedule angio, left message for pt to return call. AW 

## 2015-10-01 ENCOUNTER — Other Ambulatory Visit (HOSPITAL_COMMUNITY): Payer: Self-pay | Admitting: Interventional Radiology

## 2015-10-01 DIAGNOSIS — I771 Stricture of artery: Secondary | ICD-10-CM

## 2015-10-09 ENCOUNTER — Other Ambulatory Visit: Payer: Self-pay | Admitting: General Surgery

## 2015-10-11 ENCOUNTER — Encounter (HOSPITAL_COMMUNITY): Payer: Self-pay

## 2015-10-11 ENCOUNTER — Other Ambulatory Visit (HOSPITAL_COMMUNITY): Payer: Self-pay | Admitting: Interventional Radiology

## 2015-10-11 ENCOUNTER — Ambulatory Visit (HOSPITAL_COMMUNITY)
Admission: RE | Admit: 2015-10-11 | Discharge: 2015-10-11 | Disposition: A | Discharge status: Home / Self Care | Payer: Medicare HMO | Source: Ambulatory Visit | Attending: Interventional Radiology | Admitting: Interventional Radiology

## 2015-10-11 DIAGNOSIS — Z7982 Long term (current) use of aspirin: Secondary | ICD-10-CM | POA: Insufficient documentation

## 2015-10-11 DIAGNOSIS — E785 Hyperlipidemia, unspecified: Secondary | ICD-10-CM | POA: Diagnosis not present

## 2015-10-11 DIAGNOSIS — Z8249 Family history of ischemic heart disease and other diseases of the circulatory system: Secondary | ICD-10-CM | POA: Insufficient documentation

## 2015-10-11 DIAGNOSIS — I10 Essential (primary) hypertension: Secondary | ICD-10-CM | POA: Diagnosis not present

## 2015-10-11 DIAGNOSIS — Z96652 Presence of left artificial knee joint: Secondary | ICD-10-CM | POA: Diagnosis not present

## 2015-10-11 DIAGNOSIS — Z833 Family history of diabetes mellitus: Secondary | ICD-10-CM | POA: Diagnosis not present

## 2015-10-11 DIAGNOSIS — Z8673 Personal history of transient ischemic attack (TIA), and cerebral infarction without residual deficits: Secondary | ICD-10-CM | POA: Diagnosis not present

## 2015-10-11 DIAGNOSIS — I771 Stricture of artery: Secondary | ICD-10-CM

## 2015-10-11 DIAGNOSIS — M1990 Unspecified osteoarthritis, unspecified site: Secondary | ICD-10-CM | POA: Insufficient documentation

## 2015-10-11 DIAGNOSIS — I6502 Occlusion and stenosis of left vertebral artery: Secondary | ICD-10-CM | POA: Diagnosis not present

## 2015-10-11 DIAGNOSIS — I6523 Occlusion and stenosis of bilateral carotid arteries: Secondary | ICD-10-CM | POA: Diagnosis not present

## 2015-10-11 LAB — BASIC METABOLIC PANEL WITH GFR
Anion gap: 10 (ref 5–15)
BUN: 16 mg/dL (ref 6–20)
CO2: 25 mmol/L (ref 22–32)
Calcium: 9.2 mg/dL (ref 8.9–10.3)
Chloride: 105 mmol/L (ref 101–111)
Creatinine, Ser: 1.27 mg/dL — ABNORMAL HIGH (ref 0.61–1.24)
GFR calc Af Amer: >60 mL/min
GFR calc non Af Amer: 58 mL/min — ABNORMAL LOW
Glucose, Bld: 110 mg/dL — ABNORMAL HIGH (ref 65–99)
Potassium: 4.1 mmol/L (ref 3.5–5.1)
Sodium: 140 mmol/L (ref 135–145)

## 2015-10-11 LAB — CBC
HEMATOCRIT: 40.8 % (ref 39.0–52.0)
HEMOGLOBIN: 13.2 g/dL (ref 13.0–17.0)
MCH: 28 pg (ref 26.0–34.0)
MCHC: 32.4 g/dL (ref 30.0–36.0)
MCV: 86.6 fL (ref 78.0–100.0)
Platelets: 200 10*3/uL (ref 150–400)
RBC: 4.71 MIL/uL (ref 4.22–5.81)
RDW: 13.4 % (ref 11.5–15.5)
WBC: 5 10*3/uL (ref 4.0–10.5)

## 2015-10-11 LAB — PROTIME-INR
INR: 0.99 (ref 0.00–1.49)
Prothrombin Time: 13.3 s (ref 11.6–15.2)

## 2015-10-11 LAB — APTT: APTT: 31 s (ref 24–37)

## 2015-10-11 MED ORDER — SODIUM CHLORIDE 0.9 % IV SOLN
INTRAVENOUS | Status: DC
  Administered 2015-10-11: 08:00:00 via INTRAVENOUS

## 2015-10-11 MED ORDER — IOPAMIDOL (ISOVUE-300) INJECTION 61%
INTRAVENOUS | Status: AC
  Filled 2015-10-11: qty 150

## 2015-10-11 MED ORDER — MIDAZOLAM HCL 2 MG/2ML IJ SOLN
INTRAMUSCULAR | Status: AC
  Filled 2015-10-11: qty 2

## 2015-10-11 MED ORDER — FENTANYL CITRATE (PF) 100 MCG/2ML IJ SOLN
INTRAMUSCULAR | Status: AC | PRN
  Administered 2015-10-11 (×2): 25 ug via INTRAVENOUS

## 2015-10-11 MED ORDER — MIDAZOLAM HCL 2 MG/2ML IJ SOLN
INTRAMUSCULAR | Status: AC | PRN
  Administered 2015-10-11 (×2): 1 mg via INTRAVENOUS

## 2015-10-11 MED ORDER — FENTANYL CITRATE (PF) 100 MCG/2ML IJ SOLN
INTRAMUSCULAR | Status: AC
  Filled 2015-10-11: qty 2

## 2015-10-11 MED ORDER — HEPARIN SODIUM (PORCINE) 1000 UNIT/ML IJ SOLN
INTRAMUSCULAR | Status: AC | PRN
  Administered 2015-10-11: 1000 [IU] via INTRAVENOUS
  Administered 2015-10-11: 500 [IU] via INTRAVENOUS

## 2015-10-11 MED ORDER — LIDOCAINE HCL 1 % IJ SOLN
INTRAMUSCULAR | Status: AC
  Filled 2015-10-11: qty 20

## 2015-10-11 MED ORDER — SODIUM CHLORIDE 0.9 % IV SOLN: INTRAVENOUS | Status: AC

## 2015-10-11 MED ORDER — IOPAMIDOL (ISOVUE-300) INJECTION 61%
INTRAVENOUS | Status: AC
  Administered 2015-10-11: 75 mL
  Filled 2015-10-11: qty 50

## 2015-10-11 MED ORDER — HYDRALAZINE HCL 20 MG/ML IJ SOLN
INTRAMUSCULAR | Status: AC
  Filled 2015-10-11: qty 1

## 2015-10-11 MED ORDER — HYDRALAZINE HCL 20 MG/ML IJ SOLN
INTRAMUSCULAR | Status: AC | PRN
  Administered 2015-10-11 (×3): 5 mg via INTRAVENOUS

## 2015-10-11 MED ORDER — HEPARIN SODIUM (PORCINE) 1000 UNIT/ML IJ SOLN
INTRAMUSCULAR | Status: AC
  Filled 2015-10-11: qty 2

## 2015-10-11 MED ORDER — LIDOCAINE HCL (PF) 1 % IJ SOLN
INTRAMUSCULAR | Status: AC | PRN
  Administered 2015-10-11: 10 mL

## 2015-10-11 NOTE — Sedation Documentation (Signed)
5 Fr. Exoseal to right groin 

## 2015-10-11 NOTE — H&P (Signed)
Chief Complaint: left vertebral artery stenosis  Referring Physician:Dr. Narda Amber  Supervising Physician: Luanne Bras  Patient Status: Out-pt  HPI: Alexander Gonzalez is an 65 y.o. male who was admitted to the hospital in April of 2017 with a severe HA.  He admits to occasional HAs specifically when he turns his head to the left.  The pain is located on the right side.  He denies any other major symptoms associated with this.  He saw Dr. Estanislado Pandy and has been arranged to have a cerebral angiogram to determine if he has any significant disease process that will require intervention.  He presents today for this procedure.  He has no new complaints.  Past Medical History:  Past Medical History  Diagnosis Date  . Hypertension   . Osteoarthritis   . Mini stroke ([place]) 1996  . Hyperlipidemia     Past Surgical History:  Past Surgical History  Procedure Laterality Date  . Knee surgery  1976    right  . Replacement total knee  1996    left  . Knee surgery  1980'2    right    Family History:  Family History  Problem Relation Age of Onset  . Cancer Mother     Deceased, 42  . Hypertension Mother   . Heart attack Father     Deceased  . Diabetes Brother     Deceased  . Autism Son   . Healthy Daughter     Social History:  reports that he has never smoked. He has never used smokeless tobacco. He reports that he drinks alcohol. He reports that he does not use illicit drugs.  Allergies: No Known Allergies  Medications:   Medication List    ASK your doctor about these medications        acetaminophen 500 MG tablet  Commonly known as:  TYLENOL  Take 2 tablets (1,000 mg total) by mouth every 6 (six) hours as needed for mild pain or headache.     amLODipine 10 MG tablet  Commonly known as:  NORVASC  Take 1 tablet (10 mg total) by mouth daily.     aspirin 81 MG EC tablet  Take 1 tablet (81 mg total) by mouth daily.     clonazePAM 0.5 MG tablet  Commonly  known as:  KLONOPIN  Take 1 tablet (0.5 mg total) by mouth 2 (two) times daily.     hydrALAZINE 100 MG tablet  Commonly known as:  APRESOLINE  Take 1 tablet (100 mg total) by mouth every 6 (six) hours.     losartan 100 MG tablet  Commonly known as:  COZAAR  Take 0.5 tablets (50 mg total) by mouth daily.     meclizine 25 MG tablet  Commonly known as:  ANTIVERT  Take 1 tablet (25 mg total) by mouth 2 (two) times daily as needed for dizziness.     naproxen sodium 220 MG tablet  Commonly known as:  ANAPROX  Take 660 mg by mouth daily as needed (Headaches).     oxyCODONE 5 MG immediate release tablet  Commonly known as:  Oxy IR/ROXICODONE  Take 1 tablet (5 mg total) by mouth every 6 (six) hours as needed (Headache and pain).     simvastatin 40 MG tablet  Commonly known as:  ZOCOR  Take 0.5 tablets (20 mg total) by mouth every evening. Take 20 mg -half a tablet daily        Please HPI for pertinent positives, otherwise complete  10 system ROS negative.  Mallampati Score: MD Evaluation Airway: WNL Heart: WNL Abdomen: WNL Chest/ Lungs: WNL ASA  Classification: 3 Mallampati/Airway Score: Two  Physical Exam: BP 179/96 mmHg  Pulse 84  Temp(Src) 98.2 F (36.8 C) (Oral)  Resp 18  Ht _0  (1.93 m)  Wt 265 lb (120.203 kg)  BMI 32.27 kg/m2  SpO2 100% Body mass index is 32.27 kg/(m^2). General: pleasant, obese, black male who is laying in bed in NAD HEENT: head is normocephalic, atraumatic.  Sclera are noninjected.  PERRL.  Ears and nose without any masses or lesions.  Mouth is pink and moist Heart: regular, rate, and rhythm.  Normal s1,s2. No obvious murmurs, gallops, or rubs noted.  Palpable radial and pedal pulses bilaterally Lungs: CTAB, no wheezes, rhonchi, or rales noted.  Respiratory effort nonlabored Abd: soft, NT, obese, +BS, no masses or organomegaly, reducible umbilical hernia. MS: all 4 extremities are symmetrical with no cyanosis, clubbing, or edema. Psych: A&Ox3  with an appropriate affect.   Labs: Results for orders placed or performed during the hospital encounter of 10/11/15 (from the past 48 hour(s))  APTT     Status: None   Collection Time: 10/11/15  8:15 AM  Result Value Ref Range   aPTT 31 24 - 37 seconds  Basic metabolic panel     Status: Abnormal   Collection Time: 10/11/15  8:15 AM  Result Value Ref Range   Sodium 140 135 - 145 mmol/L   Potassium 4.1 3.5 - 5.1 mmol/L   Chloride 105 101 - 111 mmol/L   CO2 25 22 - 32 mmol/L   Glucose, Bld 110 (H) 65 - 99 mg/dL   BUN 16 6 - 20 mg/dL   Creatinine, Ser 1.27 (H) 0.61 - 1.24 mg/dL   Calcium 9.2 8.9 - 10.3 mg/dL   GFR calc non Af Amer 58 (L) >60 mL/min   GFR calc Af Amer >60 >60 mL/min    Comment: (NOTE) The eGFR has been calculated using the CKD EPI equation. This calculation has not been validated in all clinical situations. eGFR's persistently <60 mL/min signify possible Chronic Kidney Disease.    Anion gap 10 5 - 15  CBC     Status: None   Collection Time: 10/11/15  8:15 AM  Result Value Ref Range   WBC 5.0 4.0 - 10.5 K/uL   RBC 4.71 4.22 - 5.81 MIL/uL   Hemoglobin 13.2 13.0 - 17.0 g/dL   HCT 40.8 39.0 - 52.0 %   MCV 86.6 78.0 - 100.0 fL   MCH 28.0 26.0 - 34.0 pg   MCHC 32.4 30.0 - 36.0 g/dL   RDW 13.4 11.5 - 15.5 %   Platelets 200 150 - 400 K/uL  Protime-INR     Status: None   Collection Time: 10/11/15  8:15 AM  Result Value Ref Range   Prothrombin Time 13.3 11.6 - 15.2 seconds   INR 0.99 0.00 - 1.49    Imaging: No results found.  Assessment/Plan 1. Headaches, left vertebral artery stenosis -we will plan on diagnostic cerebral angiogram today to further evaluate the patient's HAs and MRI findings. -labs and vitals have been reviewed -Risks and Benefits discussed with the patient including, but not limited to bleeding, infection, vascular injury or contrast induced renal failure. All of the patient's questions were answered, patient is agreeable to  proceed. Consent signed and in chart.   Thank you for this interesting consult.  I greatly enjoyed meeting Corbet Hanley and look forward  to participating in their care.  A copy of this report was sent to the requesting provider on this date.  Electronically Signed: Henreitta Cea 10/11/2015, 8:54 AM   I spent a total of    25 Minutes in face to face in clinical consultation, greater than 50% of which was counseling/coordinating care for headaches, left vertebral artery stenosis

## 2015-10-11 NOTE — Discharge Instructions (Signed)
Cerebral Angiogram, Care After °Refer to this sheet in the next few weeks. These instructions provide you with information on caring for yourself after your procedure. Your health care provider may also give you more specific instructions. Your treatment has been planned according to current medical practices, but problems sometimes occur. Call your health care provider if you have any problems or questions after your procedure. °WHAT TO EXPECT AFTER THE PROCEDURE °After your procedure, it is typical to have the following: °· Bruising at the catheter insertion site that usually fades within 1-2 weeks. °· Blood collecting in the tissue (hematoma) that may be painful to the touch. It should usually decrease in size and tenderness within 1-2 weeks. °· A mild headache. °HOME CARE INSTRUCTIONS °· Take medicines only as directed by your health care provider. °· You may shower 24-48 hours after the procedure or as directed by your health care provider. Remove the bandage (dressing) and gently wash the site with plain soap and water. Pat the area dry with a clean towel. Do not rub the site, because this may cause bleeding. °· Do not take baths, swim, or use a hot tub until your health care provider approves. °· Check your insertion site every day for redness, swelling, or drainage. °· Do not apply powder or lotion to the site. °· Do not lift over 10 lb (4.5 kg) for 5 days after your procedure or as directed by your health care provider. °· Ask your health care provider when it is okay to: °¨ Return to work or school. °¨ Resume usual physical activities or sports. °¨ Resume sexual activity. °· Do not drive home if you are discharged the same day as the procedure. Have someone else drive you. °· You may drive 24 hours after the procedure unless otherwise instructed by your health care provider. °· Do not operate machinery or power tools for 24 hours after the procedure or as directed by your health care provider. °· If your  procedure was done as an outpatient procedure, which means that you went home the same day as your procedure, a responsible adult should be with you for the first 24 hours after you arrive home. °· Keep all follow-up visits as directed by your health care provider. This is important. °SEEK MEDICAL CARE IF: °· You have a fever. °· You have chills. °· You have increased bleeding from the catheter insertion site. Hold pressure on the site. °SEEK IMMEDIATE MEDICAL CARE IF: °· You have vision changes or loss of vision. °· You have numbness or weakness on one side of your body. °· You have difficulty talking, or you have slurred speech or cannot speak (aphasia). °· You feel confused or have difficulty remembering. °· You have unusual pain at the catheter insertion site. °· You have redness, warmth, or swelling at the catheter insertion site. °· You have drainage (other than a small amount of blood on the dressing) from the catheter insertion site. °· The catheter insertion site is bleeding, and the bleeding does not stop after 30 minutes of holding steady pressure on the site. °These symptoms may represent a serious problem that is an emergency. Do not wait to see if the symptoms will go away. Get medical help right away. Call your local emergency services (911 in [place] Do not drive yourself to the hospital. °  °This information is not intended to replace advice given to you by your health care provider. Make sure you discuss any questions you have with your   health care provider. °  °Document Released: 08/01/2013 Document Revised: 01/03/2014 Document Reviewed: 08/01/2013 °Elsevier Interactive Patient Education ©2016 [employer] ° °

## 2015-10-11 NOTE — Sedation Documentation (Signed)
ETCO2 removed per Dr. Deveshwar 

## 2015-10-11 NOTE — Sedation Documentation (Signed)
MD at bedside.  Dr Corliss Skainseveshwar in to see pt, questions answered.

## 2015-10-11 NOTE — Procedures (Signed)
S/P 4 vessel cerebral arteriogram. RT CFA approach. Findings. 1approx 80 to 85 % stenosis Lt ICA prox. 2.Approx 70 to 75 % stenosis RT ICA prx 3 approx 60  To 70 % stenosis of dominant RT VBJ

## 2015-10-12 ENCOUNTER — Other Ambulatory Visit (HOSPITAL_COMMUNITY): Payer: Self-pay | Admitting: Interventional Radiology

## 2015-10-16 ENCOUNTER — Telehealth (HOSPITAL_COMMUNITY): Payer: Self-pay

## 2015-10-16 ENCOUNTER — Other Ambulatory Visit (HOSPITAL_COMMUNITY): Payer: Self-pay | Admitting: Interventional Radiology

## 2015-10-16 DIAGNOSIS — I771 Stricture of artery: Secondary | ICD-10-CM

## 2015-10-16 NOTE — Telephone Encounter (Signed)
Called to schedule 2 wk f/u, left vm. AW

## 2015-11-01 ENCOUNTER — Ambulatory Visit (HOSPITAL_COMMUNITY)
Admission: RE | Admit: 2015-11-01 | Discharge: 2015-11-01 | Disposition: A | Discharge status: Home / Self Care | Payer: Medicare HMO | Source: Ambulatory Visit | Attending: Interventional Radiology | Admitting: Interventional Radiology

## 2015-11-01 DIAGNOSIS — I771 Stricture of artery: Secondary | ICD-10-CM

## 2015-11-01 DIAGNOSIS — G450 Vertebro-basilar artery syndrome: Secondary | ICD-10-CM | POA: Diagnosis not present

## 2015-11-01 HISTORY — PX: IR GENERIC HISTORICAL: IMG1180011

## 2015-11-02 ENCOUNTER — Encounter (HOSPITAL_COMMUNITY): Payer: Self-pay | Admitting: Interventional Radiology

## 2015-11-12 DIAGNOSIS — R7303 Prediabetes: Secondary | ICD-10-CM | POA: Diagnosis not present

## 2015-11-12 DIAGNOSIS — I651 Occlusion and stenosis of basilar artery: Secondary | ICD-10-CM | POA: Diagnosis not present

## 2015-11-12 DIAGNOSIS — M109 Gout, unspecified: Secondary | ICD-10-CM | POA: Diagnosis not present

## 2015-12-06 ENCOUNTER — Ambulatory Visit: Payer: Medicare HMO | Admitting: Neurology

## 2015-12-06 DIAGNOSIS — Z029 Encounter for administrative examinations, unspecified: Secondary | ICD-10-CM

## 2015-12-20 ENCOUNTER — Telehealth (HOSPITAL_COMMUNITY): Payer: Self-pay

## 2015-12-20 NOTE — Telephone Encounter (Signed)
Called to schedule 8 wk f/u in the office, left message for pt to return call. AW

## 2016-05-14 DIAGNOSIS — I651 Occlusion and stenosis of basilar artery: Secondary | ICD-10-CM | POA: Diagnosis not present

## 2016-05-14 DIAGNOSIS — I10 Essential (primary) hypertension: Secondary | ICD-10-CM | POA: Diagnosis not present

## 2016-05-14 DIAGNOSIS — R946 Abnormal results of thyroid function studies: Secondary | ICD-10-CM | POA: Diagnosis not present

## 2016-05-14 DIAGNOSIS — E7800 Pure hypercholesterolemia, unspecified: Secondary | ICD-10-CM | POA: Diagnosis not present

## 2016-05-14 DIAGNOSIS — R7303 Prediabetes: Secondary | ICD-10-CM | POA: Diagnosis not present

## 2016-06-09 DIAGNOSIS — Z0100 Encounter for examination of eyes and vision without abnormal findings: Secondary | ICD-10-CM | POA: Diagnosis not present

## 2016-06-09 DIAGNOSIS — H524 Presbyopia: Secondary | ICD-10-CM | POA: Diagnosis not present

## 2016-10-22 IMAGING — CT CT HEAD W/O CM
2 series · 16 of 30 positions shown, 20 images · non-contrast
Comparison: None.

CLINICAL DATA: Severe temporal headache. Left arm pain. Dizziness
and nausea.

EXAM:
CT HEAD WITHOUT CONTRAST
TECHNIQUE: Contiguous axial images were obtained from the base of the skull
through the vertex without intravenous contrast.

[Series 201: head w/o, idose (1) · axial · non-contrast · 0.44mm/px · z∈[+100,+235]mm · 13 of 33 slices shown, 17 images]
[im 3/33  brain]
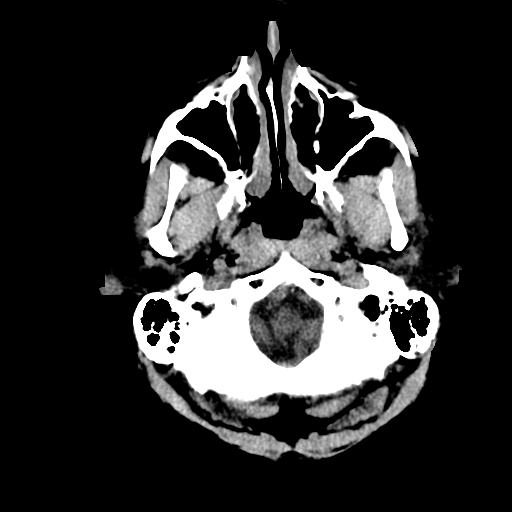
[im 3/33  bone]
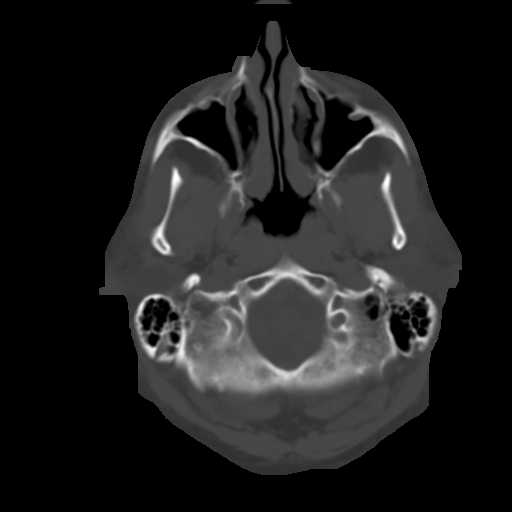
[im 5/33  brain]
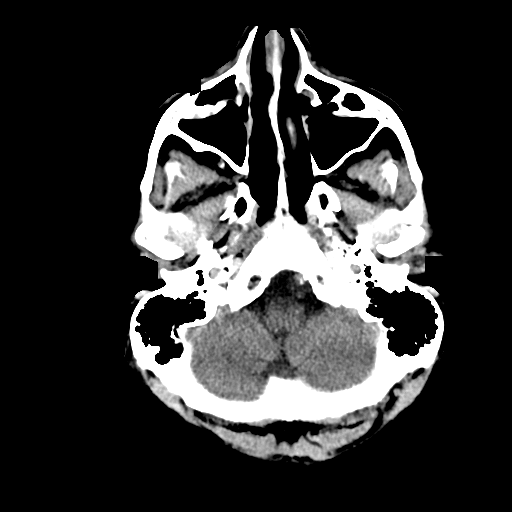
[im 7/33  brain]
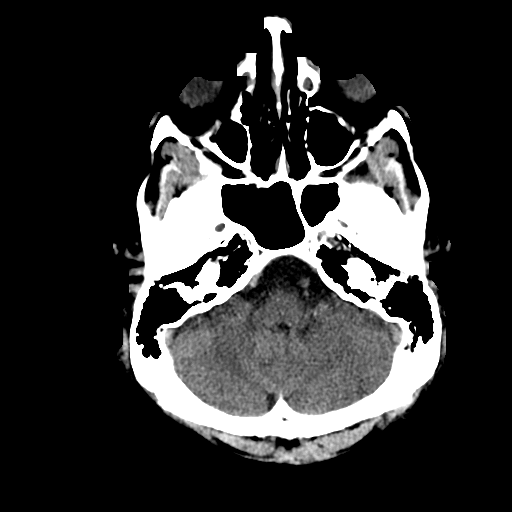
[im 10/33  brain]
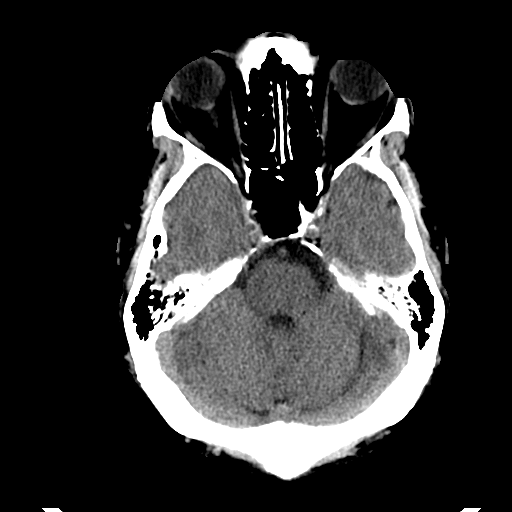
[im 12/33  brain]
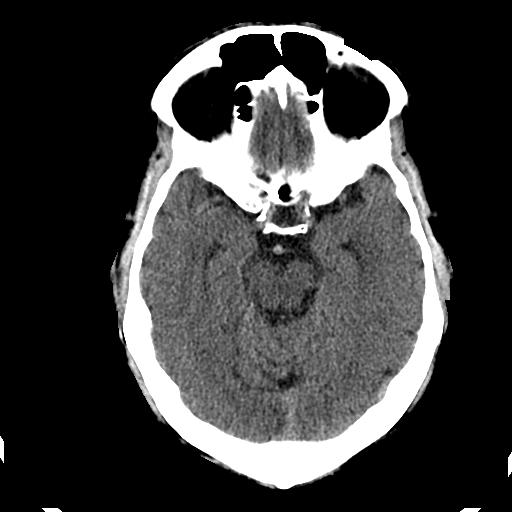
[im 12/33  bone]
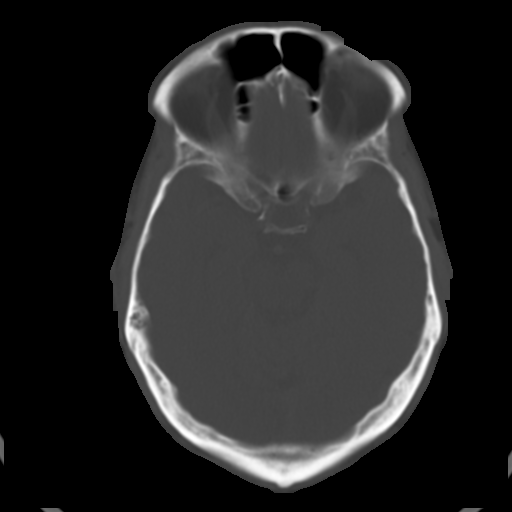
[im 14/33  brain]
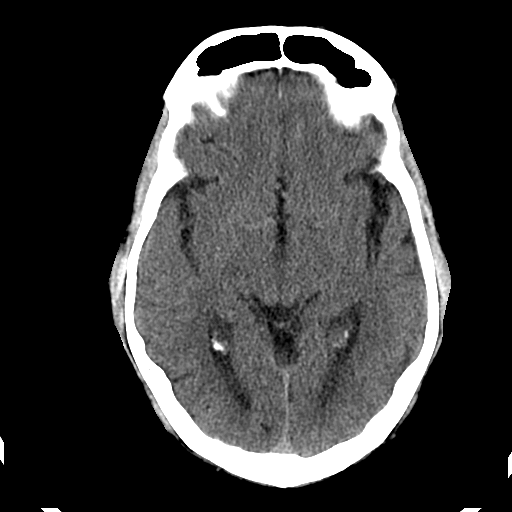
[im 17/33  brain]
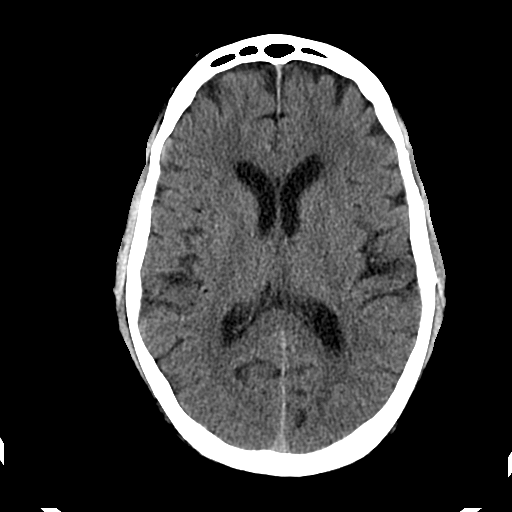
[im 19/33  brain]
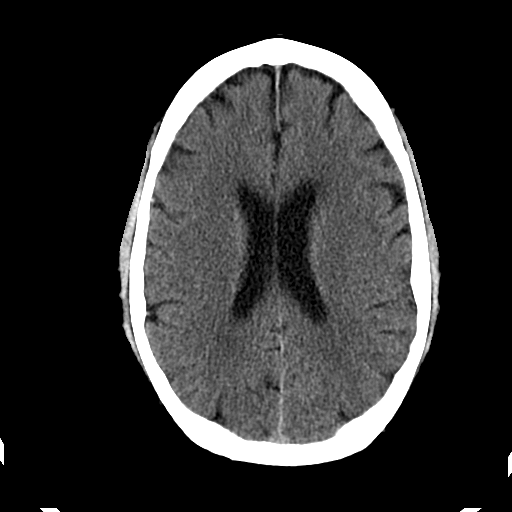
[im 21/33  brain]
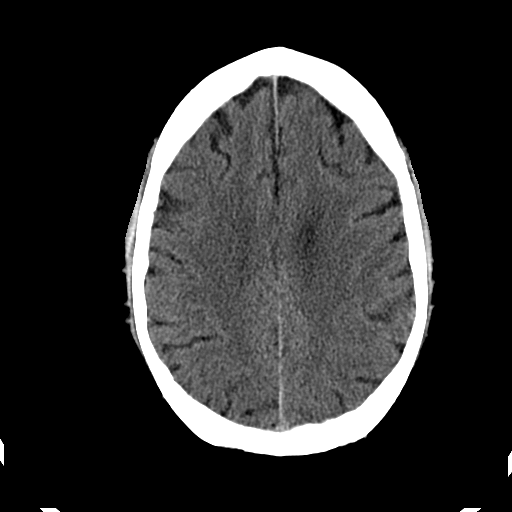
[im 21/33  bone]
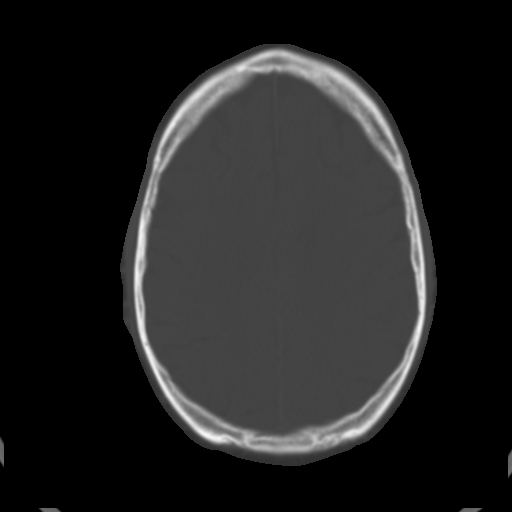
[im 23/33  brain]
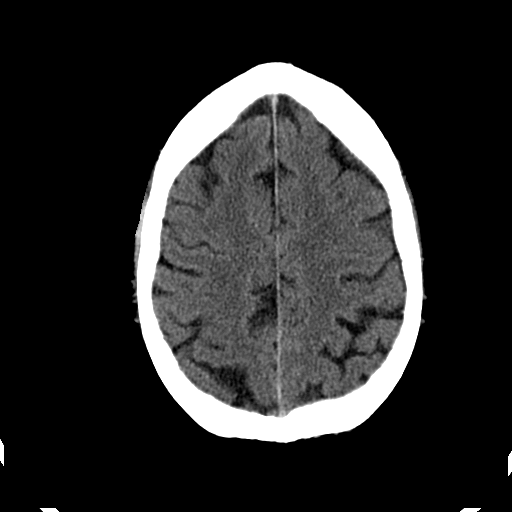
[im 26/33  brain]
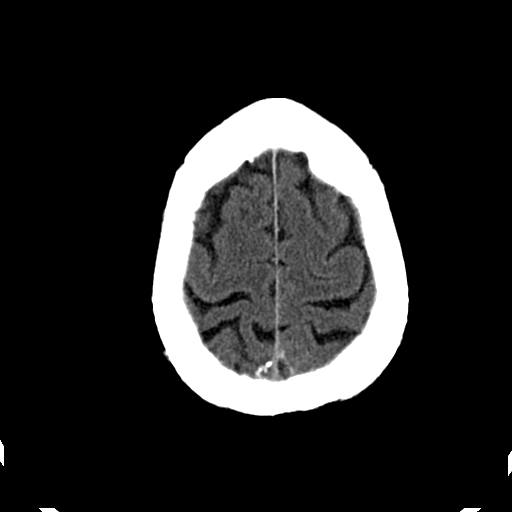
[im 28/33  brain]
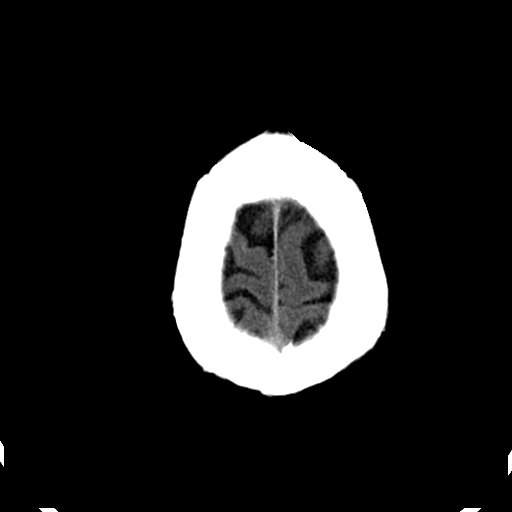
[im 30/33  brain]
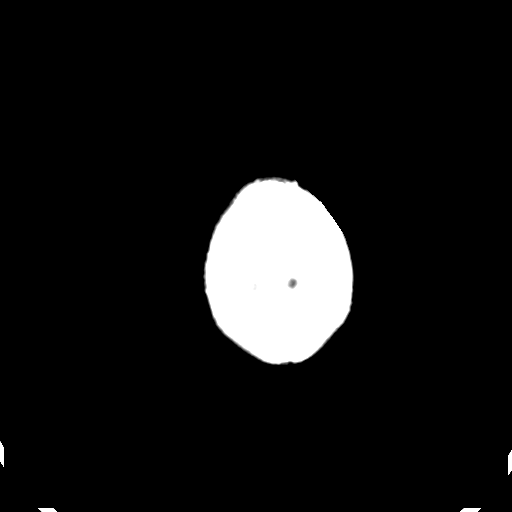
[im 30/33  bone]
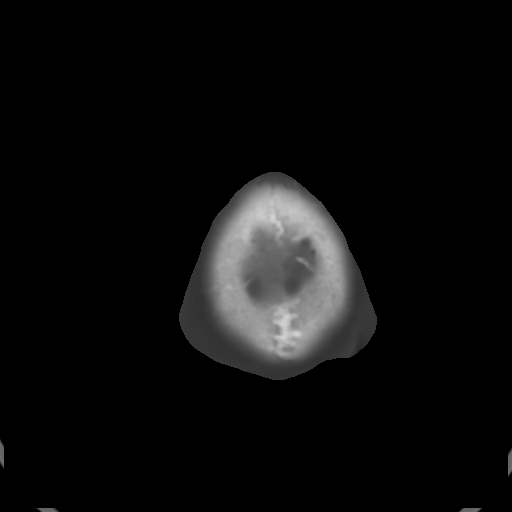

[Series 202: head w/o bone, idose (1) · axial · non-contrast · 0.44mm/px · z∈[+100,+145]mm · 3 of 33 slices shown]
[im 3/33  bone]
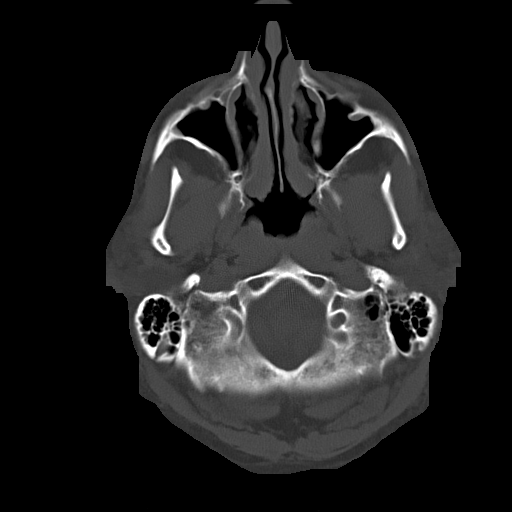
[im 7/33  bone]
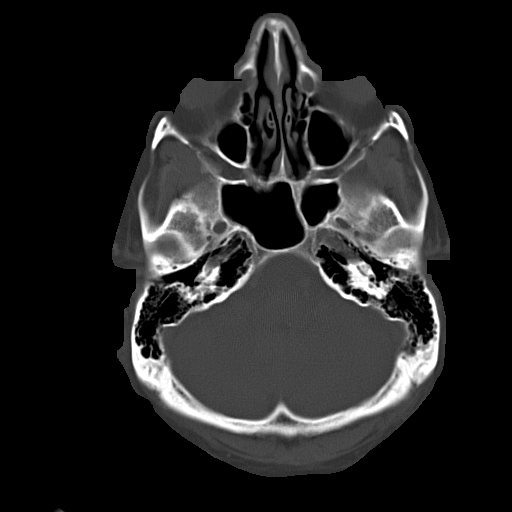
[im 12/33  bone]
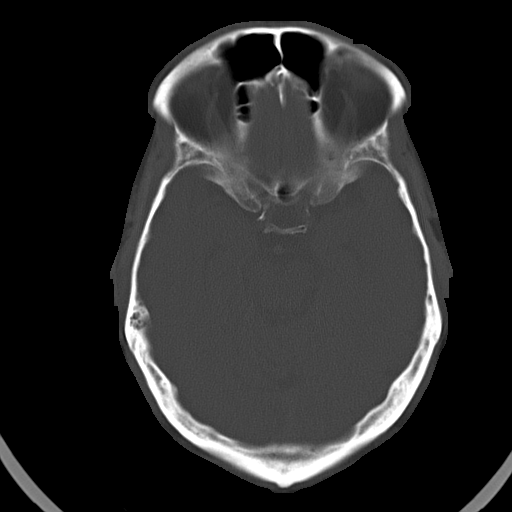

[16 of 30 positions shown; findings below may reference images not displayed]

FINDINGS: There is cortical atrophy and chronic microvascular ischemic change.
No evidence of acute intracranial abnormality including hemorrhage,
infarct, mass lesion, mass effect, midline shift or abnormal
extra-axial fluid collection is identified. No hydrocephalus or
pneumocephalus. Empty sella is noted. Imaged paranasal sinuses and
mastoid air cells are clear. The calvarium is intact.
IMPRESSION: No acute abnormality.

## 2016-10-22 IMAGING — MR MR HEAD W/O CM
6 of 8 series · 31 of 48 positions shown · non-contrast
Comparison: Head CT from earlier today

CLINICAL DATA: Headache

EXAM:
MRI HEAD WITHOUT CONTRAST
TECHNIQUE: Multiplanar, multiecho pulse sequences of the brain and surrounding
structures were obtained without intravenous contrast.

[Series 3: DWI · axial · 3.6mm · 0.94mm/px · z∈[-44,+107]mm · 11 of 86 slices shown (1 of 2)]
[im 1/86]
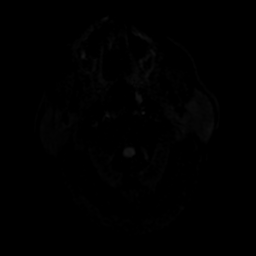
[im 9/86]
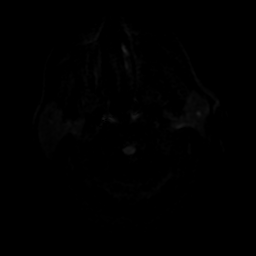
[im 18/86]
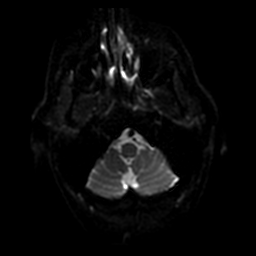
[im 26/86]
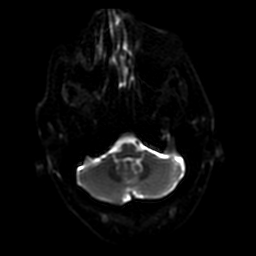
[im 35/86]
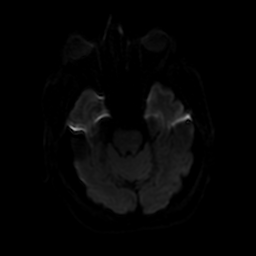
[im 43/86]
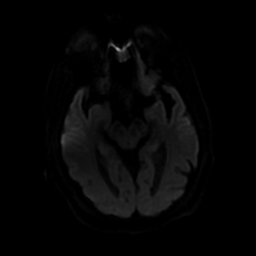
[im 52/86]
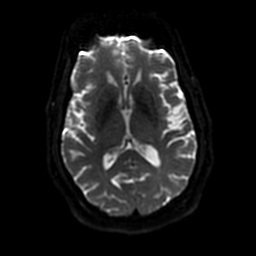
[im 60/86]
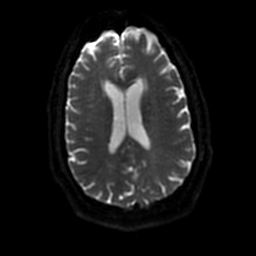
[im 69/86]
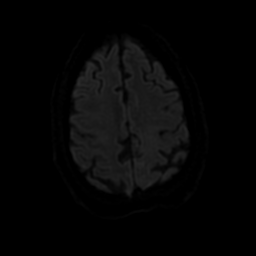
[im 77/86]
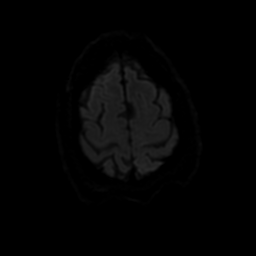
[im 86/86]
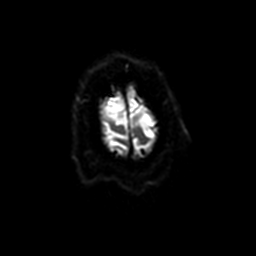

[Series 4: T2 · axial · 5.0mm · 0.47mm/px · z∈[-35,+103]mm · 3 of 24 slices shown]
[im 1/24]
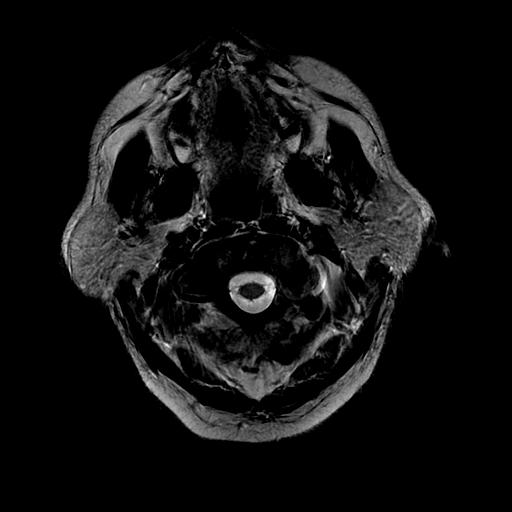
[im 12/24]
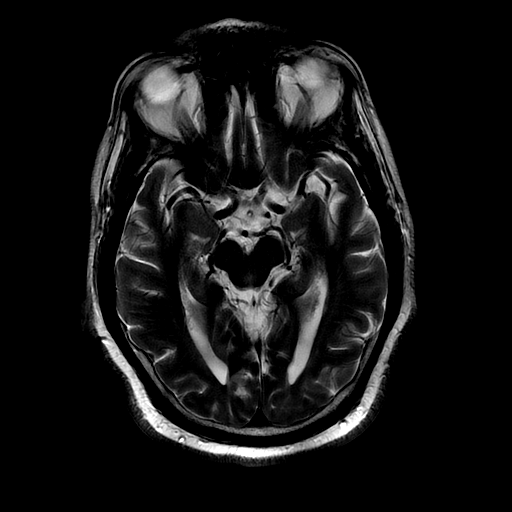
[im 24/24]
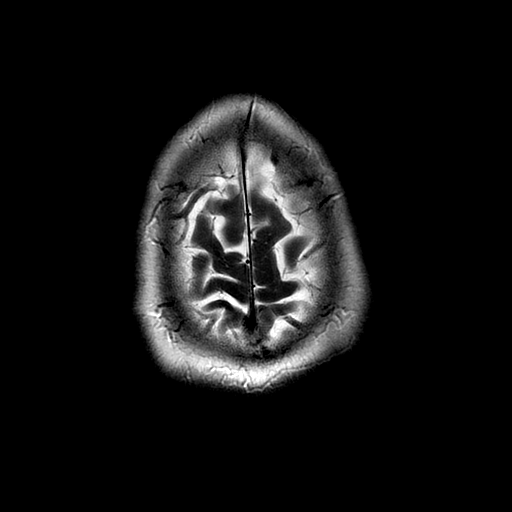

[Series 5: FLAIR · axial · 5.0mm · 0.47mm/px · z∈[-35,+103]mm · 3 of 24 slices shown (1 of 2)]
[im 1/24]
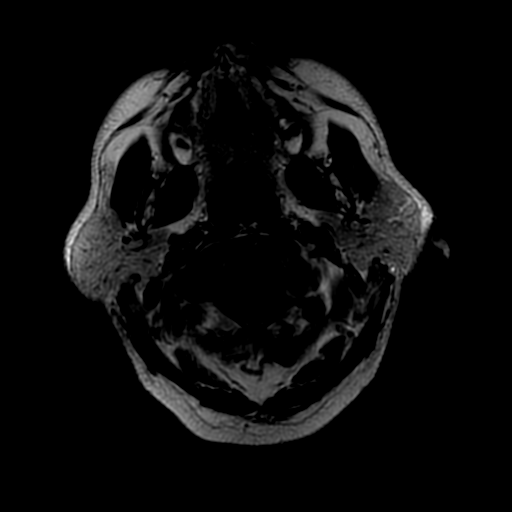
[im 12/24]
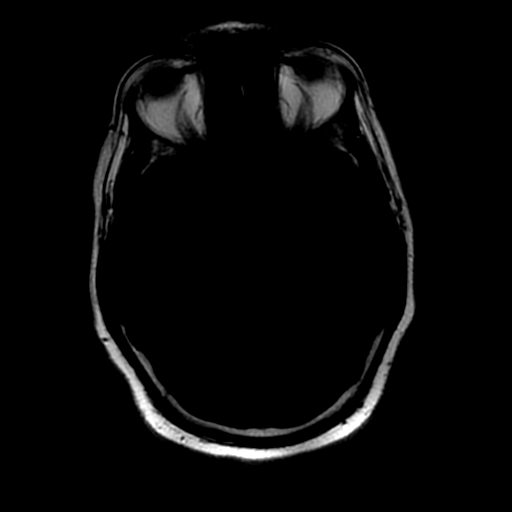
[im 24/24]
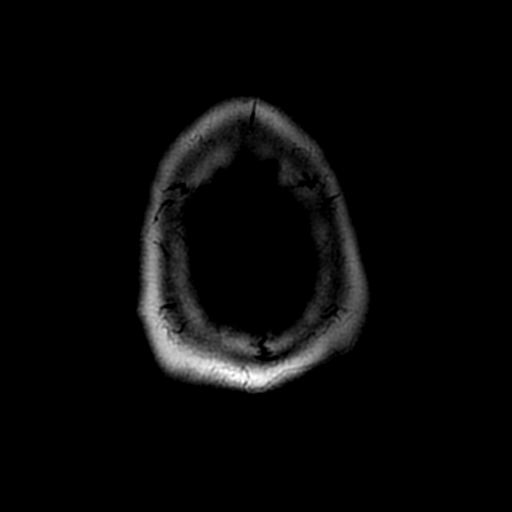

[Series 6: (person_name) · axial · 3.0mm · 0.47mm/px · z∈[-36,+46]mm · 5 of 96 slices shown]
[im 1/96]
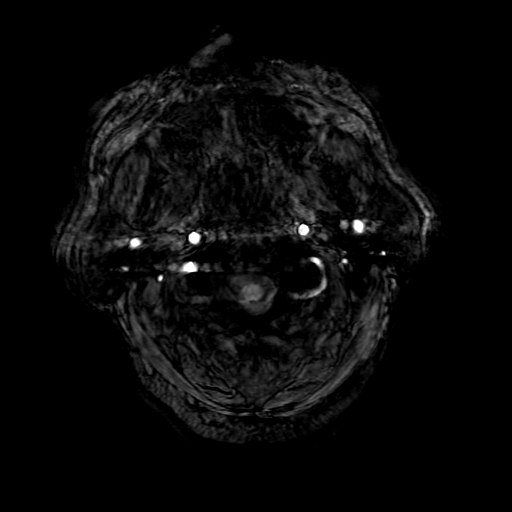
[im 16/96]
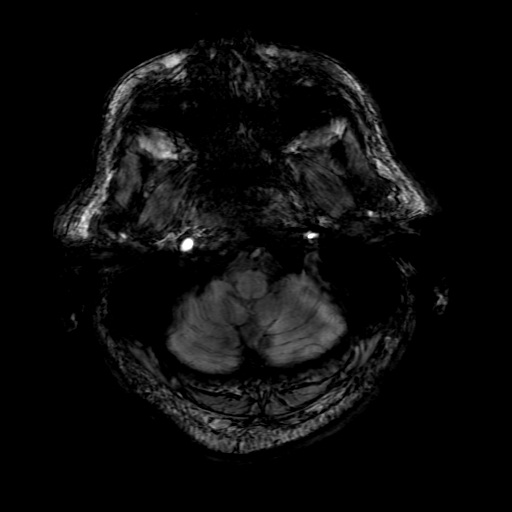
[im 32/96]
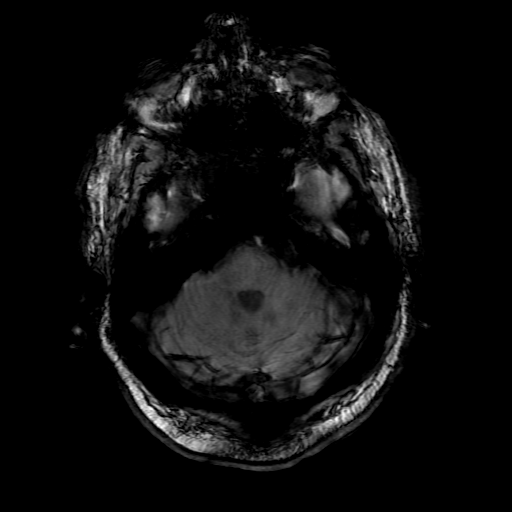
[im 40/96]
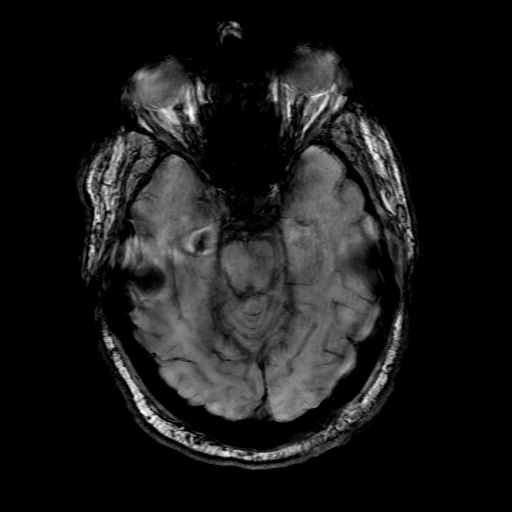
[im 56/96]
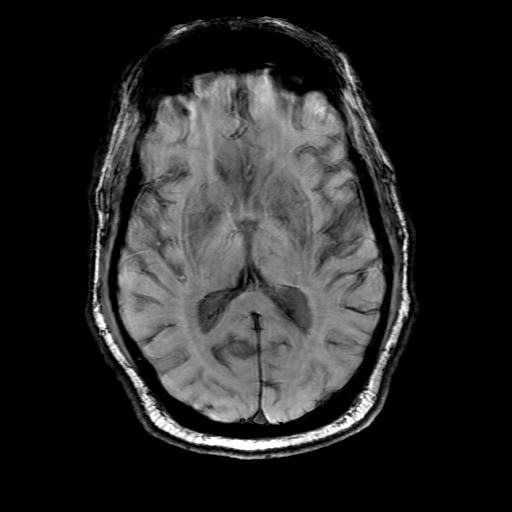

[Series 8: FLAIR · sagittal · 5.0mm · 0.47mm/px · 3 of 21 slices shown (2 of 2)]
[im 1/21]
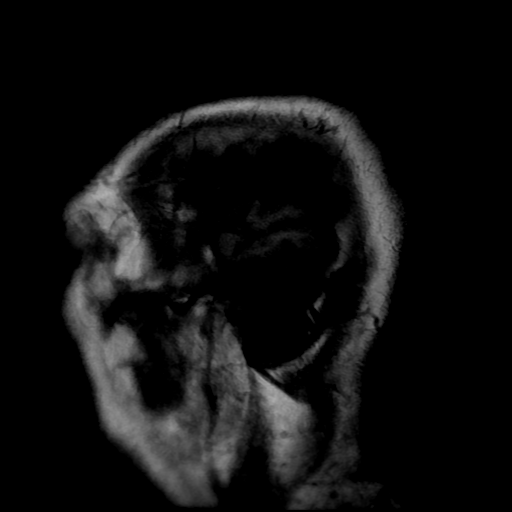
[im 11/21]
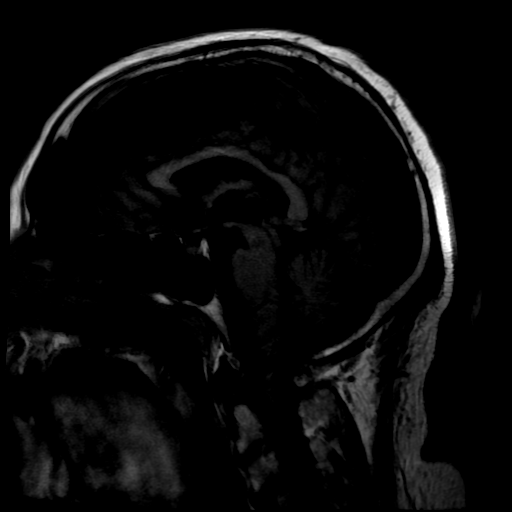
[im 21/21]
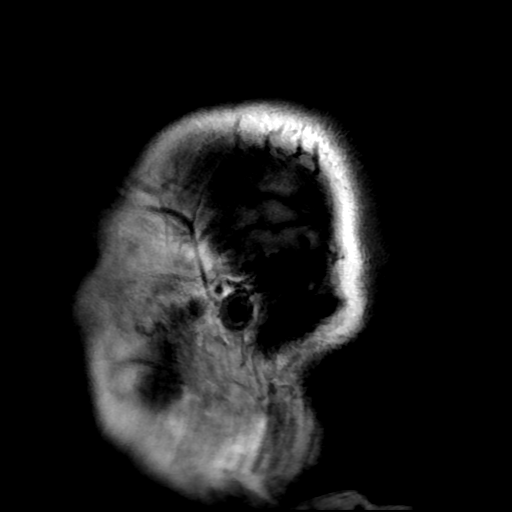

[Series 300: DWI · axial · 3.6mm · 0.94mm/px · z∈[-44,+107]mm · 6 of 42 slices shown (2 of 2)]
[im 1/42]
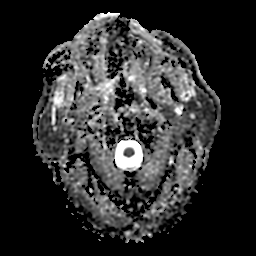
[im 9/42]
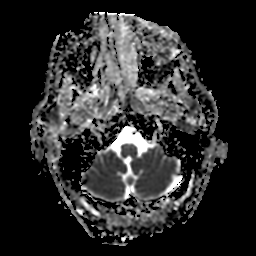
[im 17/42]
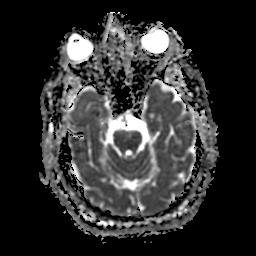
[im 25/42]
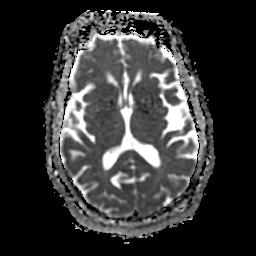
[im 33/42]
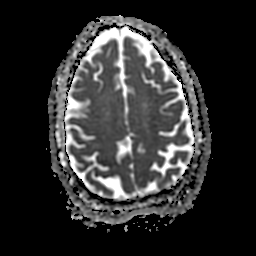
[im 42/42]
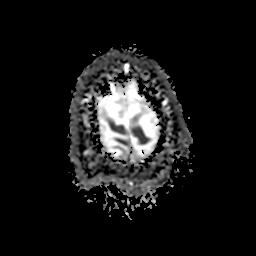

[31 of 48 positions shown; findings below may reference images not displayed]

FINDINGS: Calvarium and upper cervical spine: No focal marrow signal
abnormality.

Orbits: No significant findings.

Sinuses and Mastoids: Clear.

Brain: No acute abnormality such as infarct, hemorrhage,
hydrocephalus, or mass lesion.

Asymmetric loss of flow related hypointensity within the left
vertebral artery could be from slow flow or partial occlusion (there
is evidence of flow at the level of the PICA). Chronicity is
uncertain, but no acute infarct. This is presumably atherosclerotic
given calcification within the vertebrobasilar system on preceding
head CT. No T1 signal suggestive of intramural hematoma.

Patchy FLAIR hyperintensity in the bilateral cerebral white matter,
mild small vessel ischemic change for age in this patient history of
hypertension and hyperlipidemia.
IMPRESSION: 1. No definitive explanation for headache.
2. Abnormal signal in the left vertebral artery, slow flow versus
partial occlusion. No associated acute or remote infarct.

## 2016-11-26 DIAGNOSIS — Z125 Encounter for screening for malignant neoplasm of prostate: Secondary | ICD-10-CM | POA: Diagnosis not present

## 2016-11-26 DIAGNOSIS — E663 Overweight: Secondary | ICD-10-CM | POA: Diagnosis not present

## 2016-11-26 DIAGNOSIS — Z1389 Encounter for screening for other disorder: Secondary | ICD-10-CM | POA: Diagnosis not present

## 2016-11-26 DIAGNOSIS — I10 Essential (primary) hypertension: Secondary | ICD-10-CM | POA: Diagnosis not present

## 2016-11-26 DIAGNOSIS — Z6834 Body mass index (BMI) 34.0-34.9, adult: Secondary | ICD-10-CM | POA: Diagnosis not present

## 2016-11-26 DIAGNOSIS — I651 Occlusion and stenosis of basilar artery: Secondary | ICD-10-CM | POA: Diagnosis not present

## 2016-11-26 DIAGNOSIS — K629 Disease of anus and rectum, unspecified: Secondary | ICD-10-CM | POA: Diagnosis not present

## 2016-11-26 DIAGNOSIS — M109 Gout, unspecified: Secondary | ICD-10-CM | POA: Diagnosis not present

## 2016-11-26 DIAGNOSIS — E7800 Pure hypercholesterolemia, unspecified: Secondary | ICD-10-CM | POA: Diagnosis not present

## 2016-11-26 DIAGNOSIS — Z0000 Encounter for general adult medical examination without abnormal findings: Secondary | ICD-10-CM | POA: Diagnosis not present

## 2016-11-26 DIAGNOSIS — R7303 Prediabetes: Secondary | ICD-10-CM | POA: Diagnosis not present

## 2017-02-12 DIAGNOSIS — R61 Generalized hyperhidrosis: Secondary | ICD-10-CM | POA: Diagnosis not present

## 2017-02-12 DIAGNOSIS — E7800 Pure hypercholesterolemia, unspecified: Secondary | ICD-10-CM | POA: Diagnosis not present

## 2017-02-12 DIAGNOSIS — I10 Essential (primary) hypertension: Secondary | ICD-10-CM | POA: Diagnosis not present

## 2017-02-12 DIAGNOSIS — Z743 Need for continuous supervision: Secondary | ICD-10-CM | POA: Diagnosis not present

## 2017-02-12 DIAGNOSIS — R531 Weakness: Secondary | ICD-10-CM | POA: Diagnosis not present

## 2017-02-12 DIAGNOSIS — R5383 Other fatigue: Secondary | ICD-10-CM | POA: Diagnosis not present

## 2017-02-12 DIAGNOSIS — R55 Syncope and collapse: Secondary | ICD-10-CM | POA: Diagnosis not present

## 2017-02-12 DIAGNOSIS — N179 Acute kidney failure, unspecified: Secondary | ICD-10-CM | POA: Diagnosis not present

## 2017-04-23 ENCOUNTER — Encounter (HOSPITAL_COMMUNITY): Payer: Self-pay | Admitting: Emergency Medicine

## 2017-04-23 ENCOUNTER — Other Ambulatory Visit: Payer: Self-pay

## 2017-04-23 DIAGNOSIS — I6523 Occlusion and stenosis of bilateral carotid arteries: Secondary | ICD-10-CM | POA: Diagnosis not present

## 2017-04-23 DIAGNOSIS — Z79899 Other long term (current) drug therapy: Secondary | ICD-10-CM | POA: Insufficient documentation

## 2017-04-23 DIAGNOSIS — K429 Umbilical hernia without obstruction or gangrene: Secondary | ICD-10-CM | POA: Diagnosis not present

## 2017-04-23 DIAGNOSIS — I2510 Atherosclerotic heart disease of native coronary artery without angina pectoris: Secondary | ICD-10-CM | POA: Diagnosis not present

## 2017-04-23 DIAGNOSIS — I10 Essential (primary) hypertension: Secondary | ICD-10-CM | POA: Insufficient documentation

## 2017-04-23 DIAGNOSIS — Z7982 Long term (current) use of aspirin: Secondary | ICD-10-CM | POA: Insufficient documentation

## 2017-04-23 DIAGNOSIS — Z8673 Personal history of transient ischemic attack (TIA), and cerebral infarction without residual deficits: Secondary | ICD-10-CM | POA: Diagnosis not present

## 2017-04-23 DIAGNOSIS — K8020 Calculus of gallbladder without cholecystitis without obstruction: Secondary | ICD-10-CM | POA: Diagnosis not present

## 2017-04-23 DIAGNOSIS — I672 Cerebral atherosclerosis: Secondary | ICD-10-CM | POA: Diagnosis not present

## 2017-04-23 DIAGNOSIS — R51 Headache: Secondary | ICD-10-CM | POA: Diagnosis not present

## 2017-04-23 LAB — COMPREHENSIVE METABOLIC PANEL
ALBUMIN: 4.1 g/dL (ref 3.5–5.0)
ALT: 17 U/L (ref 17–63)
ANION GAP: 12 (ref 5–15)
AST: 21 U/L (ref 15–41)
Alkaline Phosphatase: 54 U/L (ref 38–126)
BUN: 19 mg/dL (ref 6–20)
CO2: 21 mmol/L — AB (ref 22–32)
Calcium: 9.5 mg/dL (ref 8.9–10.3)
Chloride: 103 mmol/L (ref 101–111)
Creatinine, Ser: 1.52 mg/dL — ABNORMAL HIGH (ref 0.61–1.24)
GFR calc Af Amer: 53 mL/min — ABNORMAL LOW (ref >60)
GFR calc non Af Amer: 46 mL/min — ABNORMAL LOW (ref >60)
GLUCOSE: 111 mg/dL — AB (ref 65–99)
POTASSIUM: 3.8 mmol/L (ref 3.5–5.1)
Sodium: 136 mmol/L (ref 135–145)
TOTAL PROTEIN: 7.8 g/dL (ref 6.5–8.1)
Total Bilirubin: 0.9 mg/dL (ref 0.3–1.2)

## 2017-04-23 LAB — LIPASE, BLOOD: Lipase: 40 U/L (ref 11–51)

## 2017-04-23 LAB — CBC
HEMATOCRIT: 44 % (ref 39.0–52.0)
Hemoglobin: 14.7 g/dL (ref 13.0–17.0)
MCH: 28.5 pg (ref 26.0–34.0)
MCHC: 33.4 g/dL (ref 30.0–36.0)
MCV: 85.4 fL (ref 78.0–100.0)
Platelets: 229 10*3/uL (ref 150–400)
RBC: 5.15 MIL/uL (ref 4.22–5.81)
RDW: 13.7 % (ref 11.5–15.5)
WBC: 6.8 10*3/uL (ref 4.0–10.5)

## 2017-04-23 MED ORDER — ONDANSETRON 4 MG PO TBDP
4.0000 mg | ORAL_TABLET | Freq: Once | ORAL | Status: AC | PRN
  Administered 2017-04-23: 4 mg via ORAL
  Filled 2017-04-23: qty 1

## 2017-04-23 NOTE — ED Triage Notes (Signed)
Pt reports a headache for three days, hx of "clogged arteries to the brain," per pt.  He has not seen his doctor for this "in a while".  Apparently he also developed abdominal pain and nausea about the same time.

## 2017-04-24 ENCOUNTER — Emergency Department (HOSPITAL_COMMUNITY)
Admission: EM | Admit: 2017-04-24 | Discharge: 2017-04-24 | Disposition: A | Discharge status: Home / Self Care | Payer: Medicare HMO | Attending: Emergency Medicine | Admitting: Emergency Medicine

## 2017-04-24 ENCOUNTER — Emergency Department (HOSPITAL_COMMUNITY): Payer: Medicare HMO

## 2017-04-24 DIAGNOSIS — I10 Essential (primary) hypertension: Secondary | ICD-10-CM | POA: Diagnosis not present

## 2017-04-24 DIAGNOSIS — K8020 Calculus of gallbladder without cholecystitis without obstruction: Secondary | ICD-10-CM

## 2017-04-24 DIAGNOSIS — Z8673 Personal history of transient ischemic attack (TIA), and cerebral infarction without residual deficits: Secondary | ICD-10-CM | POA: Diagnosis not present

## 2017-04-24 DIAGNOSIS — Z7982 Long term (current) use of aspirin: Secondary | ICD-10-CM | POA: Diagnosis not present

## 2017-04-24 DIAGNOSIS — R51 Headache: Secondary | ICD-10-CM

## 2017-04-24 DIAGNOSIS — I2510 Atherosclerotic heart disease of native coronary artery without angina pectoris: Secondary | ICD-10-CM | POA: Diagnosis not present

## 2017-04-24 DIAGNOSIS — R519 Headache, unspecified: Secondary | ICD-10-CM

## 2017-04-24 DIAGNOSIS — I6523 Occlusion and stenosis of bilateral carotid arteries: Secondary | ICD-10-CM | POA: Diagnosis not present

## 2017-04-24 DIAGNOSIS — I672 Cerebral atherosclerosis: Secondary | ICD-10-CM

## 2017-04-24 DIAGNOSIS — Z79899 Other long term (current) drug therapy: Secondary | ICD-10-CM | POA: Diagnosis not present

## 2017-04-24 DIAGNOSIS — K429 Umbilical hernia without obstruction or gangrene: Secondary | ICD-10-CM | POA: Diagnosis not present

## 2017-04-24 LAB — URINALYSIS, ROUTINE W REFLEX MICROSCOPIC
Bacteria, UA: NONE SEEN
Bilirubin Urine: NEGATIVE
Glucose, UA: NEGATIVE mg/dL
HGB URINE DIPSTICK: NEGATIVE
Ketones, ur: 5 mg/dL — AB
LEUKOCYTES UA: NEGATIVE
Nitrite: NEGATIVE
PH: 5 (ref 5.0–8.0)
Protein, ur: 100 mg/dL — AB
SPECIFIC GRAVITY, URINE: 1.027 (ref 1.005–1.030)

## 2017-04-24 MED ORDER — SODIUM CHLORIDE 0.9 % IV BOLUS (SEPSIS)
1000.0000 mL | Freq: Once | INTRAVENOUS | Status: AC
  Administered 2017-04-24: 1000 mL via INTRAVENOUS

## 2017-04-24 MED ORDER — ONDANSETRON HCL 4 MG/2ML IJ SOLN
4.0000 mg | Freq: Once | INTRAMUSCULAR | Status: AC
  Administered 2017-04-24: 4 mg via INTRAVENOUS
  Filled 2017-04-24: qty 2

## 2017-04-24 MED ORDER — MORPHINE SULFATE (PF) 4 MG/ML IV SOLN
4.0000 mg | Freq: Once | INTRAVENOUS | Status: AC
  Administered 2017-04-24: 4 mg via INTRAVENOUS
  Filled 2017-04-24: qty 1

## 2017-04-24 MED ORDER — IOPAMIDOL (ISOVUE-370) INJECTION 76%
INTRAVENOUS | Status: AC
  Administered 2017-04-24: 100 mL
  Filled 2017-04-24: qty 100

## 2017-04-24 MED ORDER — HYDROCODONE-ACETAMINOPHEN 5-325 MG PO TABS: 2.0000 | ORAL_TABLET | Freq: Four times a day (QID) | ORAL | 0 refills | Status: DC | PRN

## 2017-04-24 MED ORDER — ONDANSETRON 4 MG PO TBDP: 4.0000 mg | ORAL_TABLET | Freq: Four times a day (QID) | ORAL | 0 refills | Status: DC | PRN

## 2017-04-24 NOTE — Discharge Instructions (Addendum)
Please continue your blood pressure medications, your high cholesterol medications and your clopidogrel which is a blood thinner.  Please follow-up with neurology.  Your head and neck CT did not show any change compared to 2017.  Your CT of your abdomen pelvis showed that you have gallstones which is likely causing your abdominal pain, vomiting.  I recommend a low-fat diet.  If symptoms continue, I recommend follow-up with general surgery as you may need to have your gallbladder removed.

## 2017-04-24 NOTE — ED Provider Notes (Signed)
TIME SEEN: 3:43 AM  CHIEF COMPLAINT: Headache, abdominal pain, nausea and vomiting  HPI: Patient is a 67 year old male with history of hypertension, hyperlipidemia, TIA, bilateral carotid and vertebrobasilar artery stenosis currently on Plavix who presents emergency department with diffuse headache.  Mostly in bilateral temples.  Present for the past 3 days.  Described as throbbing, severe but not sudden onset.  Has had similar headaches before but not this bad.  States that when he has had these previous headaches have been treated like migraines.  He has been taking Excedrin at home without much relief.  States that he had similar headache when he was here in 2017 and was admitted to the hospital but at that time he had right-sided weakness.  No new numbness, tingling or focal weakness currently.  No vision changes.  Does have intermittent tingling in the right anterior lateral thigh that has been going on for years.  He denies any chest pain or shortness of breath.  Reports compliance with Plavix.    Also states 3 days ago he developed right-sided abdominal pain that is sharp and severe with nausea and vomiting.  No fever, diarrhea.  No history of abdominal surgery.  No recent travel.  No sick contact.  States that this started the same time as his headache.  ROS: See HPI Constitutional: no fever  Eyes: no drainage  ENT: no runny nose   Cardiovascular:  no chest pain  Resp: no SOB  GI: no vomiting GU: no dysuria Integumentary: no rash  Allergy: no hives  Musculoskeletal: no leg swelling  Neurological: no slurred speech ROS otherwise negative  PAST MEDICAL HISTORY/PAST SURGICAL HISTORY:  Past Medical History:  Diagnosis Date  . Hyperlipidemia   . Hypertension   . Mini stroke ([facility]) 1996  . Osteoarthritis     MEDICATIONS:  Prior to Admission medications   Medication Sig Start Date End Date Taking? Authorizing Provider  acetaminophen (TYLENOL) 500 MG tablet Take 2 tablets (1,000 mg  total) by mouth every 6 (six) hours as needed for mild pain or headache. 07/27/15   Richarda OverlieAbrol, Nayana, MD  amLODipine (NORVASC) 10 MG tablet Take 1 tablet (10 mg total) by mouth daily. 11/24/12   Leone BrandIngold, Laura R, NP  aspirin EC 81 MG EC tablet Take 1 tablet (81 mg total) by mouth daily. 01/16/15   Penny PiaVega, Orlando, MD  clonazePAM (KLONOPIN) 0.5 MG tablet Take 1 tablet (0.5 mg total) by mouth 2 (two) times daily. Patient taking differently: Take 0.5 mg by mouth 2 (two) times daily as needed for anxiety.  07/27/15   Richarda OverlieAbrol, Nayana, MD  hydrALAZINE (APRESOLINE) 100 MG tablet Take 1 tablet (100 mg total) by mouth every 6 (six) hours. Patient taking differently: Take 100 mg by mouth every 6 (six) hours as needed.  07/27/15   Richarda OverlieAbrol, Nayana, MD  losartan (COZAAR) 100 MG tablet Take 0.5 tablets (50 mg total) by mouth daily. 07/27/15   Richarda OverlieAbrol, Nayana, MD  meclizine (ANTIVERT) 25 MG tablet Take 1 tablet (25 mg total) by mouth 2 (two) times daily as needed for dizziness. 08/24/15   Nita SicklePatel, Donika K, DO  naproxen sodium (ANAPROX) 220 MG tablet Take 660 mg by mouth daily as needed (Headaches).    [provider]  oxyCODONE (OXY IR/ROXICODONE) 5 MG immediate release tablet Take 1 tablet (5 mg total) by mouth every 6 (six) hours as needed (Headache and pain). 07/27/15   Richarda OverlieAbrol, Nayana, MD  simvastatin (ZOCOR) 40 MG tablet Take 0.5 tablets (20 mg total)  by mouth every evening. Take 20 mg -half a tablet daily Patient taking differently: Take 40 mg by mouth daily.  11/24/12   Leone Brand, NP    ALLERGIES:  No Known Allergies  SOCIAL HISTORY:  Social History   Tobacco Use  . Smoking status: Never Smoker  . Smokeless tobacco: Never Used  Substance Use Topics  . Alcohol use: Yes    Alcohol/week: 0.0 oz    Comment: occas.    FAMILY HISTORY: Family History  Problem Relation Age of Onset  . Cancer Mother        Deceased, 26  . Hypertension Mother   . Heart attack Father        Deceased  . Diabetes Brother         Deceased  . Autism Son   . Healthy Daughter     EXAM: BP (!) 153/95   Pulse 71   Temp 98.2 F (36.8 C) (Oral)   Resp 16   Ht 6\' 4"  (1.93 m)   Wt 127 kg (280 lb)   SpO2 98%   BMI 34.08 kg/m  CONSTITUTIONAL: Alert and oriented and responds appropriately to questions.  Appears uncomfortable, afebrile, well-hydrated HEAD: Normocephalic EYES: Conjunctivae clear, pupils appear equal, EOMI ENT: normal nose; moist mucous membranes NECK: Supple, no meningismus, no nuchal rigidity, no LAD  CARD: RRR; S1 and S2 appreciated; no murmurs, no clicks, no rubs, no gallops RESP: Normal chest excursion without splinting or tachypnea; breath sounds clear and equal bilaterally; no wheezes, no rhonchi, no rales, no hypoxia or respiratory distress, speaking full sentences ABD/GI: Normal bowel sounds; non-distended; soft, tender in the right upper quadrant and mid right abdomen, no rebound, no guarding, no peritoneal signs, no hepatosplenomegaly BACK:  The back appears normal and is non-tender to palpation, there is no CVA tenderness EXT: Normal ROM in all joints; non-tender to palpation; no edema; normal capillary refill; no cyanosis, no calf tenderness or swelling    SKIN: Normal color for age and race; warm; no rash NEURO: Moves all extremities equally, strength 5/5 in all 4 extremities, sensation to light touch intact diffusely, cranial nerves II through XII intact, normal speech, normal gait PSYCH: The patient's mood and manner are appropriate. Grooming and personal hygiene are appropriate.  MEDICAL DECISION MAKING: Patient here with symptoms of severe headache.  Had similar symptoms when he had diagnosis of carotid and vertebrobasilar artery stenosis.  He is on Plavix.  No focal neurologic deficits at this time but will obtain CT of his head and neck to ensure no significant change.  Also tender throughout the right side of the abdomen.  Differential diagnosis includes cholelithiasis,  cholecystitis, pancreatitis, colitis, bowel obstruction, appendicitis.  Labs and urine reassuring other than mild chronic kidney disease which is stable.  Will obtain CT of his abdomen pelvis for further evaluation.  Will give pain and nausea medicine.  ED PROGRESS: CT of the abdomen pelvis shows cholelithiasis without cholecystitis.  Likely the cause of his right-sided abdominal pain.  Negative Murphy sign here.  No leukocytosis or fever.  Normal LFTs and lipase.   7:55 AM  CT angio of the head and neck showed no significant change compared to previous in 2017.  Patient is already on a statin as well as Plavix and reports compliance.  I did speak to Dr. Wilford Corner on-call for neurology.  Appreciate his help.  He recommends that if headache is not improving to give migraine cocktail and have patient follow-up as an outpatient  with Dr. Pearlean Brownie will [facility] neurology.  He states that patient does not need any acute intervention at this time.  He is not having focal neurologic deficits currently to suggest stroke.  No sign of any bleeding.  I do not feel he needs a lumbar puncture.  No signs of meningitis.  8:05 AM  Pt's headache completely resolved after 1 dose of morphine as well as his abdominal pain.  No longer vomiting.  We will give him outpatient neurology follow-up.  We will also give outpatient general surgery follow-up.  Will discharge with Vicodin, Zofran given his cholelithiasis, biliary colic.  Recommended that he eat a low-fat diet.  Recommend he continue his statin and Plavix.  Patient comfortable with this plan.  Discussed at length return precautions.   At this time, I do not feel there is any life-threatening condition present. I have reviewed and discussed all results (EKG, imaging, lab, urine as appropriate) and exam findings with patient/family. I have reviewed nursing notes and appropriate previous records.  I feel the patient is safe to be discharged home without further emergent workup and  can continue workup as an outpatient as needed. Discussed usual and customary return precautions. Patient/family verbalize understanding and are comfortable with this plan.  Outpatient follow-up has been provided if needed. All questions have been answered.     Ward, Layla Maw, DO 04/24/17 [phone]

## 2017-05-27 DIAGNOSIS — I10 Essential (primary) hypertension: Secondary | ICD-10-CM | POA: Diagnosis not present

## 2017-05-27 DIAGNOSIS — E7800 Pure hypercholesterolemia, unspecified: Secondary | ICD-10-CM | POA: Diagnosis not present

## 2017-05-27 DIAGNOSIS — I651 Occlusion and stenosis of basilar artery: Secondary | ICD-10-CM | POA: Diagnosis not present

## 2017-05-27 DIAGNOSIS — K8020 Calculus of gallbladder without cholecystitis without obstruction: Secondary | ICD-10-CM | POA: Diagnosis not present

## 2017-05-27 DIAGNOSIS — R7303 Prediabetes: Secondary | ICD-10-CM | POA: Diagnosis not present

## 2019-12-19 ENCOUNTER — Observation Stay (HOSPITAL_COMMUNITY): Payer: Medicare HMO

## 2019-12-19 ENCOUNTER — Emergency Department (HOSPITAL_COMMUNITY): Payer: Medicare HMO

## 2019-12-19 ENCOUNTER — Other Ambulatory Visit: Payer: Self-pay

## 2019-12-19 ENCOUNTER — Inpatient Hospital Stay (HOSPITAL_COMMUNITY)
Admission: EM | Admit: 2019-12-19 | Discharge: 2019-12-22 | DRG: 178 | Disposition: A | Discharge status: Home Health | Payer: Medicare HMO | Attending: Internal Medicine | Admitting: Internal Medicine

## 2019-12-19 DIAGNOSIS — W1839XA Other fall on same level, initial encounter: Secondary | ICD-10-CM | POA: Diagnosis present

## 2019-12-19 DIAGNOSIS — Z6834 Body mass index (BMI) 34.0-34.9, adult: Secondary | ICD-10-CM

## 2019-12-19 DIAGNOSIS — R0602 Shortness of breath: Secondary | ICD-10-CM

## 2019-12-19 DIAGNOSIS — Z23 Encounter for immunization: Secondary | ICD-10-CM

## 2019-12-19 DIAGNOSIS — E785 Hyperlipidemia, unspecified: Secondary | ICD-10-CM | POA: Diagnosis present

## 2019-12-19 DIAGNOSIS — U071 COVID-19: Secondary | ICD-10-CM | POA: Diagnosis not present

## 2019-12-19 DIAGNOSIS — E669 Obesity, unspecified: Secondary | ICD-10-CM | POA: Diagnosis present

## 2019-12-19 DIAGNOSIS — Z7901 Long term (current) use of anticoagulants: Secondary | ICD-10-CM

## 2019-12-19 DIAGNOSIS — N1831 Chronic kidney disease, stage 3a: Secondary | ICD-10-CM | POA: Diagnosis not present

## 2019-12-19 DIAGNOSIS — Z7982 Long term (current) use of aspirin: Secondary | ICD-10-CM

## 2019-12-19 DIAGNOSIS — I340 Nonrheumatic mitral (valve) insufficiency: Secondary | ICD-10-CM

## 2019-12-19 DIAGNOSIS — H5789 Other specified disorders of eye and adnexa: Secondary | ICD-10-CM | POA: Diagnosis present

## 2019-12-19 DIAGNOSIS — Z713 Dietary counseling and surveillance: Secondary | ICD-10-CM

## 2019-12-19 DIAGNOSIS — R55 Syncope and collapse: Secondary | ICD-10-CM

## 2019-12-19 DIAGNOSIS — R9389 Abnormal findings on diagnostic imaging of other specified body structures: Secondary | ICD-10-CM | POA: Diagnosis not present

## 2019-12-19 DIAGNOSIS — N179 Acute kidney failure, unspecified: Secondary | ICD-10-CM | POA: Diagnosis present

## 2019-12-19 DIAGNOSIS — Z96652 Presence of left artificial knee joint: Secondary | ICD-10-CM | POA: Diagnosis present

## 2019-12-19 DIAGNOSIS — I129 Hypertensive chronic kidney disease with stage 1 through stage 4 chronic kidney disease, or unspecified chronic kidney disease: Secondary | ICD-10-CM | POA: Diagnosis present

## 2019-12-19 DIAGNOSIS — I10 Essential (primary) hypertension: Secondary | ICD-10-CM | POA: Diagnosis present

## 2019-12-19 DIAGNOSIS — Z79899 Other long term (current) drug therapy: Secondary | ICD-10-CM

## 2019-12-19 DIAGNOSIS — Z8673 Personal history of transient ischemic attack (TIA), and cerebral infarction without residual deficits: Secondary | ICD-10-CM

## 2019-12-19 DIAGNOSIS — M109 Gout, unspecified: Secondary | ICD-10-CM | POA: Diagnosis present

## 2019-12-19 DIAGNOSIS — Z8249 Family history of ischemic heart disease and other diseases of the circulatory system: Secondary | ICD-10-CM

## 2019-12-19 DIAGNOSIS — I672 Cerebral atherosclerosis: Secondary | ICD-10-CM | POA: Diagnosis present

## 2019-12-19 DIAGNOSIS — N183 Chronic kidney disease, stage 3 unspecified: Secondary | ICD-10-CM | POA: Diagnosis present

## 2019-12-19 DIAGNOSIS — W19XXXA Unspecified fall, initial encounter: Secondary | ICD-10-CM

## 2019-12-19 LAB — COMPREHENSIVE METABOLIC PANEL
ALT: 21 U/L (ref 0–44)
AST: 42 U/L — ABNORMAL HIGH (ref 15–41)
Albumin: 3.7 g/dL (ref 3.5–5.0)
Alkaline Phosphatase: 51 U/L (ref 38–126)
Anion gap: 14 (ref 5–15)
BUN: 17 mg/dL (ref 8–23)
CO2: 19 mmol/L — ABNORMAL LOW (ref 22–32)
Calcium: 8.7 mg/dL — ABNORMAL LOW (ref 8.9–10.3)
Chloride: 102 mmol/L (ref 98–111)
Creatinine, Ser: 1.66 mg/dL — ABNORMAL HIGH (ref 0.61–1.24)
GFR calc Af Amer: 48 mL/min — ABNORMAL LOW (ref >60)
GFR calc non Af Amer: 41 mL/min — ABNORMAL LOW (ref >60)
Glucose, Bld: 134 mg/dL — ABNORMAL HIGH (ref 70–99)
Potassium: 4.4 mmol/L (ref 3.5–5.1)
Sodium: 135 mmol/L (ref 135–145)
Total Bilirubin: 1.6 mg/dL — ABNORMAL HIGH (ref 0.3–1.2)
Total Protein: 7.6 g/dL (ref 6.5–8.1)

## 2019-12-19 LAB — URINALYSIS, ROUTINE W REFLEX MICROSCOPIC
Bacteria, UA: NONE SEEN
Bilirubin Urine: NEGATIVE
Glucose, UA: NEGATIVE mg/dL
Hgb urine dipstick: NEGATIVE
Ketones, ur: 5 mg/dL — AB
Leukocytes,Ua: NEGATIVE
Nitrite: NEGATIVE
Protein, ur: 30 mg/dL — AB
Specific Gravity, Urine: 1.023 (ref 1.005–1.030)
pH: 5 (ref 5.0–8.0)

## 2019-12-19 LAB — ETHANOL: Alcohol, Ethyl (B): <10 mg/dL (ref <10)

## 2019-12-19 LAB — ECHOCARDIOGRAM LIMITED
Area-P 1/2: 2.99 cm2
Height: 76 in
S' Lateral: 3.7 cm
Weight: 4560 oz

## 2019-12-19 LAB — CBC WITH DIFFERENTIAL/PLATELET
Abs Immature Granulocytes: 0.01 10*3/uL (ref 0.00–0.07)
Basophils Absolute: 0 10*3/uL (ref 0.0–0.1)
Basophils Relative: 0 %
Eosinophils Absolute: 0 10*3/uL (ref 0.0–0.5)
Eosinophils Relative: 1 %
HCT: 44.1 % (ref 39.0–52.0)
Hemoglobin: 13.4 g/dL (ref 13.0–17.0)
Immature Granulocytes: 0 %
Lymphocytes Relative: 10 %
Lymphs Abs: 0.4 10*3/uL — ABNORMAL LOW (ref 0.7–4.0)
MCH: 27.6 pg (ref 26.0–34.0)
MCHC: 30.4 g/dL (ref 30.0–36.0)
MCV: 90.9 fL (ref 80.0–100.0)
Monocytes Absolute: 0.5 10*3/uL (ref 0.1–1.0)
Monocytes Relative: 11 %
Neutro Abs: 3.2 10*3/uL (ref 1.7–7.7)
Neutrophils Relative %: 78 %
Platelets: 119 10*3/uL — ABNORMAL LOW (ref 150–400)
RBC: 4.85 MIL/uL (ref 4.22–5.81)
RDW: 13.2 % (ref 11.5–15.5)
WBC: 4.1 10*3/uL (ref 4.0–10.5)
nRBC: 0 % (ref 0.0–0.2)

## 2019-12-19 LAB — HIV ANTIBODY (ROUTINE TESTING W REFLEX): HIV Screen 4th Generation wRfx: NONREACTIVE

## 2019-12-19 LAB — TSH: TSH: 2.406 u[IU]/mL (ref 0.350–4.500)

## 2019-12-19 LAB — LIPID PANEL
Cholesterol: 151 mg/dL (ref 0–200)
HDL: 34 mg/dL — ABNORMAL LOW
LDL Cholesterol: 91 mg/dL (ref 0–99)
Total CHOL/HDL Ratio: 4.4 ratio
Triglycerides: 129 mg/dL
VLDL: 26 mg/dL (ref 0–40)

## 2019-12-19 LAB — TROPONIN I (HIGH SENSITIVITY)
Troponin I (High Sensitivity): 12 ng/L (ref <18)
Troponin I (High Sensitivity): 12 ng/L (ref <18)

## 2019-12-19 LAB — HEMOGLOBIN A1C
Hgb A1c MFr Bld: 5.3 % (ref 4.8–5.6)
Mean Plasma Glucose: 105.41 mg/dL

## 2019-12-19 LAB — CBG MONITORING, ED
Glucose-Capillary: 115 mg/dL — ABNORMAL HIGH (ref 70–99)
Glucose-Capillary: 88 mg/dL (ref 70–99)

## 2019-12-19 LAB — FIBRINOGEN: Fibrinogen: 639 mg/dL — ABNORMAL HIGH (ref 210–475)

## 2019-12-19 LAB — PROCALCITONIN: Procalcitonin: 0.13 ng/mL

## 2019-12-19 LAB — SARS CORONAVIRUS 2 BY RT PCR (HOSPITAL ORDER, PERFORMED IN ~~LOC~~ HOSPITAL LAB): SARS Coronavirus 2: POSITIVE — AB

## 2019-12-19 LAB — D-DIMER, QUANTITATIVE: D-Dimer, Quant: 0.69 ug{FEU}/mL — ABNORMAL HIGH (ref 0.00–0.50)

## 2019-12-19 LAB — C-REACTIVE PROTEIN: CRP: 12.3 mg/dL — ABNORMAL HIGH (ref <1.0)

## 2019-12-19 LAB — LACTATE DEHYDROGENASE: LDH: 131 U/L (ref 98–192)

## 2019-12-19 LAB — FERRITIN: Ferritin: 105 ng/mL (ref 24–336)

## 2019-12-19 MED ORDER — METHYLPREDNISOLONE SODIUM SUCC 125 MG IJ SOLR: 125.0000 mg | Freq: Once | INTRAMUSCULAR | Status: DC | PRN

## 2019-12-19 MED ORDER — MORPHINE SULFATE (PF) 2 MG/ML IV SOLN: 2.0000 mg | INTRAVENOUS | Status: DC | PRN

## 2019-12-19 MED ORDER — EPINEPHRINE 0.3 MG/0.3ML IJ SOAJ
0.3000 mg | Freq: Once | INTRAMUSCULAR | Status: DC | PRN
  Filled 2019-12-19: qty 0.6

## 2019-12-19 MED ORDER — SODIUM CHLORIDE 0.9% FLUSH
3.0000 mL | Freq: Two times a day (BID) | INTRAVENOUS | Status: DC
  Administered 2019-12-19 – 2019-12-22 (×6): 3 mL via INTRAVENOUS

## 2019-12-19 MED ORDER — DIPHENHYDRAMINE HCL 50 MG/ML IJ SOLN: 50.0000 mg | Freq: Once | INTRAMUSCULAR | Status: DC | PRN

## 2019-12-19 MED ORDER — ONDANSETRON HCL 4 MG/2ML IJ SOLN
4.0000 mg | Freq: Four times a day (QID) | INTRAMUSCULAR | Status: DC | PRN
  Administered 2019-12-21 – 2019-12-22 (×2): 4 mg via INTRAVENOUS
  Filled 2019-12-19 (×2): qty 2

## 2019-12-19 MED ORDER — HYDROCODONE-ACETAMINOPHEN 5-325 MG PO TABS
2.0000 | ORAL_TABLET | Freq: Four times a day (QID) | ORAL | Status: DC | PRN
  Administered 2019-12-19 – 2019-12-21 (×4): 2 via ORAL
  Filled 2019-12-19 (×4): qty 2

## 2019-12-19 MED ORDER — AMLODIPINE BESYLATE 10 MG PO TABS
10.0000 mg | ORAL_TABLET | Freq: Every day | ORAL | Status: DC
  Administered 2019-12-19 – 2019-12-22 (×4): 10 mg via ORAL
  Filled 2019-12-19 (×3): qty 1
  Filled 2019-12-19: qty 2

## 2019-12-19 MED ORDER — ENOXAPARIN SODIUM 60 MG/0.6ML ~~LOC~~ SOLN
60.0000 mg | SUBCUTANEOUS | Status: DC
  Administered 2019-12-19 – 2019-12-21 (×3): 60 mg via SUBCUTANEOUS
  Filled 2019-12-19 (×3): qty 0.6

## 2019-12-19 MED ORDER — INSULIN ASPART 100 UNIT/ML ~~LOC~~ SOLN
0.0000 [IU] | Freq: Three times a day (TID) | SUBCUTANEOUS | Status: DC
  Administered 2019-12-20 (×2): 2 [IU] via SUBCUTANEOUS

## 2019-12-19 MED ORDER — PERFLUTREN LIPID MICROSPHERE
1.0000 mL | INTRAVENOUS | Status: AC | PRN
  Administered 2019-12-19: 2 mL via INTRAVENOUS
  Filled 2019-12-19: qty 10

## 2019-12-19 MED ORDER — HYDRALAZINE HCL 25 MG PO TABS
100.0000 mg | ORAL_TABLET | Freq: Four times a day (QID) | ORAL | Status: DC
  Administered 2019-12-19 – 2019-12-22 (×8): 100 mg via ORAL
  Filled 2019-12-19 (×9): qty 4

## 2019-12-19 MED ORDER — INSULIN ASPART 100 UNIT/ML ~~LOC~~ SOLN: 0.0000 [IU] | Freq: Every day | SUBCUTANEOUS | Status: DC

## 2019-12-19 MED ORDER — CLOPIDOGREL BISULFATE 75 MG PO TABS
75.0000 mg | ORAL_TABLET | Freq: Every day | ORAL | Status: DC
  Administered 2019-12-19 – 2019-12-22 (×3): 75 mg via ORAL
  Filled 2019-12-19 (×3): qty 1

## 2019-12-19 MED ORDER — SODIUM CHLORIDE 0.9 % IV SOLN
1200.0000 mg | Freq: Once | INTRAVENOUS | Status: AC
  Administered 2019-12-19: 1200 mg via INTRAVENOUS
  Filled 2019-12-19 (×2): qty 10

## 2019-12-19 MED ORDER — ALBUTEROL SULFATE HFA 108 (90 BASE) MCG/ACT IN AERS
2.0000 | INHALATION_SPRAY | Freq: Once | RESPIRATORY_TRACT | Status: DC | PRN
  Filled 2019-12-19: qty 6.7

## 2019-12-19 MED ORDER — ACETAMINOPHEN 325 MG PO TABS
650.0000 mg | ORAL_TABLET | Freq: Four times a day (QID) | ORAL | Status: DC | PRN
  Administered 2019-12-20: 650 mg via ORAL
  Filled 2019-12-19 (×2): qty 2

## 2019-12-19 MED ORDER — SODIUM CHLORIDE 0.9 % IV SOLN: INTRAVENOUS | Status: DC | PRN

## 2019-12-19 MED ORDER — ACETAMINOPHEN 650 MG RE SUPP: 650.0000 mg | Freq: Four times a day (QID) | RECTAL | Status: DC | PRN

## 2019-12-19 MED ORDER — ONDANSETRON HCL 4 MG PO TABS
4.0000 mg | ORAL_TABLET | Freq: Four times a day (QID) | ORAL | Status: DC | PRN
  Filled 2019-12-19: qty 1

## 2019-12-19 MED ORDER — SIMVASTATIN 20 MG PO TABS
20.0000 mg | ORAL_TABLET | Freq: Every day | ORAL | Status: DC
  Administered 2019-12-19 – 2019-12-21 (×3): 20 mg via ORAL
  Filled 2019-12-19 (×3): qty 1

## 2019-12-19 MED ORDER — SODIUM CHLORIDE 0.9 % IV BOLUS
500.0000 mL | Freq: Once | INTRAVENOUS | Status: AC
  Administered 2019-12-19: 500 mL via INTRAVENOUS

## 2019-12-19 MED ORDER — ASPIRIN EC 81 MG PO TBEC
81.0000 mg | DELAYED_RELEASE_TABLET | Freq: Every day | ORAL | Status: DC
  Administered 2019-12-19 – 2019-12-22 (×3): 81 mg via ORAL
  Filled 2019-12-19 (×3): qty 1

## 2019-12-19 MED ORDER — FAMOTIDINE IN NACL 20-0.9 MG/50ML-% IV SOLN
20.0000 mg | Freq: Once | INTRAVENOUS | Status: DC | PRN
  Filled 2019-12-19: qty 50

## 2019-12-19 NOTE — ED Notes (Signed)
Patient back from MRI.

## 2019-12-19 NOTE — ED Provider Notes (Addendum)
[facility] EMERGENCY DEPARTMENT Provider Note   CSN: [id number] Arrival date & time: 12/19/19  0749  History Chief Complaint  Patient presents with  . Fall  . Head Injury  . Dizziness    Alexander Gonzalez is a 69 y.o. male.  69 year old male with past medical history positive for TIA on clopidogrel and aspirin had syncope 3 days ago.  At that time, patient was standing in kitchen when he suddenly began to feel very tired, so he was going to sit down at the table.  He did not reach the table before he blacked out and then woke up on the floor after an unknown amount of time.  It was likely only a short few minutes because when he woke up his family was still asleep.  He felt very weak when he woke up and was unable to stand.  He crawled up the stairs and went back to bed.  He denies any chest pain, shortness of breath, tongue biting, incontinence during the episode.  He continues to be very fatigued throughout the weekend, worse with exertion and had worsening tenderness to his neck and shoulders since the fall.  He had a similar episode in 2018 when he was sitting on a stool and started to feel like he was going to blackout.  He went to the hospital and was started on aspirin and Plavix but he does not know specifically what he was diagnosed with at that time.    Past Medical History:  Diagnosis Date  . Hyperlipidemia   . Hypertension   . Mini stroke (HCC) 1996  . Osteoarthritis    Patient Active Problem List   Diagnosis Date Noted  . Cephalalgia   . Occlusion and stenosis of basilar artery   . Severe headache 07/19/2015  . Abnormal computed tomography angiography of brain 07/19/2015  . CKD (chronic kidney disease), stage III 07/19/2015  . Acute hyperglycemia 07/19/2015  . Basilar artery stenosis   . Hypertensive emergency   . Hypertensive urgency 01/14/2015  . Nausea 01/14/2015  . Cerebrovascular disease 01/14/2015  . HTN (hypertension) 11/21/2012  . Gout  11/21/2012  . Preop cardiovascular exam 11/21/2012  . Hyperlipidemia 11/21/2012   Past Surgical History:  Procedure Laterality Date  . IR GENERIC HISTORICAL  11/01/2015   IR RADIOLOGIST EVAL & MGMT 11/01/2015 [facility]INTER[facility] RAD  . KNEE SURGERY  1976   right  . KNEE SURGERY  1980'2   right  . REPLACEMENT TOTAL KNEE  1996   left    Family History  Problem Relation Age of Onset  . Cancer Mother        Deceased, 38  . Hypertension Mother   . Heart attack Father        Deceased  . Diabetes Brother        Deceased  . Autism Son   . Healthy Daughter    Social History   Tobacco Use  . Smoking status: Never Smoker  . Smokeless tobacco: Never Used  Substance Use Topics  . Alcohol use: Yes    Alcohol/week: 0.0 standard drinks    Comment: occas.  . Drug use: No   Home Medications Prior to Admission medications   Medication Sig Start Date End Date Taking? Authorizing Provider  acetaminophen (TYLENOL) 500 MG tablet Take 2 tablets (1,000 mg total) by mouth every 6 (six) hours as needed for mild pain or headache. Patient not taking: Reported on 04/24/2017 07/27/15   Richarda Overlie, MD  amLODipine (NORVASC)  10 MG tablet Take 1 tablet (10 mg total) by mouth daily. 11/24/12   Leone Brand, NP  aspirin EC 81 MG EC tablet Take 1 tablet (81 mg total) by mouth daily. 01/16/15   Penny Pia, MD  clonazePAM (KLONOPIN) 0.5 MG tablet Take 1 tablet (0.5 mg total) by mouth 2 (two) times daily. Patient not taking: Reported on 04/24/2017 07/27/15   Richarda Overlie, MD  clopidogrel (PLAVIX) 75 MG tablet Take 75 mg by mouth daily. 04/03/17   [provider]  hydrALAZINE (APRESOLINE) 100 MG tablet Take 1 tablet (100 mg total) by mouth every 6 (six) hours. Patient not taking: Reported on 04/24/2017 07/27/15   Richarda Overlie, MD  HYDROcodone-acetaminophen (NORCO/VICODIN) 5-325 MG tablet Take 2 tablets by mouth every 6 (six) hours as needed for moderate pain. 04/24/17   Ward, Layla Maw, DO  losartan (COZAAR)  100 MG tablet Take 0.5 tablets (50 mg total) by mouth daily. 07/27/15   Richarda Overlie, MD  meclizine (ANTIVERT) 25 MG tablet Take 1 tablet (25 mg total) by mouth 2 (two) times daily as needed for dizziness. Patient not taking: Reported on 04/24/2017 08/24/15   Nita Sickle K, DO  ondansetron (ZOFRAN ODT) 4 MG disintegrating tablet Take 1 tablet (4 mg total) by mouth every 6 (six) hours as needed for nausea or vomiting. 04/24/17   Ward, Layla Maw, DO  oxyCODONE (OXY IR/ROXICODONE) 5 MG immediate release tablet Take 1 tablet (5 mg total) by mouth every 6 (six) hours as needed (Headache and pain). Patient not taking: Reported on 04/24/2017 07/27/15   Richarda Overlie, MD  simvastatin (ZOCOR) 40 MG tablet Take 0.5 tablets (20 mg total) by mouth every evening. Take 20 mg -half a tablet daily Patient taking differently: Take 40 mg by mouth daily.  11/24/12   Leone Brand, NP   Allergies    Patient has no known allergies.  Review of Systems   Review of Systems  Physical Exam Updated Vital Signs BP 90/63   Pulse 82   Temp 98.4 F (36.9 C) (Oral)   Resp 18   Ht 6\' 4"  (1.93 m)   Wt 129.3 kg   SpO2 98%   BMI 34.69 kg/m   Physical Exam Vitals and nursing note reviewed.  Constitutional:      General: He is not in acute distress.    Appearance: He is obese. He is diaphoretic. He is not ill-appearing or toxic-appearing.  HENT:     Head: Raccoon eyes present.     Jaw: Pain on movement (pain in neck with open mouth) present.     Nose: Nose normal.  Eyes:     General: Vision grossly intact. No visual field deficit.    Conjunctiva/sclera: Conjunctivae normal.     Comments: Hematoma to both eyes. Right worse than left. Spreads from lower lid to nasal ridge. Tender to palpation. EOM intact and painless.   Cardiovascular:     Rate and Rhythm: Normal rate and regular rhythm.     Pulses: Normal pulses.     Heart sounds: Heart sounds are distant.  Pulmonary:     Effort: Pulmonary effort is normal.      Breath sounds: Normal breath sounds.  Musculoskeletal:     Cervical back: Neck supple. Pain with movement, spinous process tenderness and muscular tenderness present.     Right lower leg: No edema.     Left lower leg: No edema.  Neurological:     Mental Status: He is alert.  ED Results / Procedures / Treatments   Labs (all labs ordered are listed, but only abnormal results are displayed) Labs Reviewed  COMPREHENSIVE METABOLIC PANEL - Abnormal; Notable for the following components:      Result Value   CO2 19 (*)    Glucose, Bld 134 (*)    Creatinine, Ser 1.66 (*)    Calcium 8.7 (*)    AST 42 (*)    Total Bilirubin 1.6 (*)    GFR calc non Af Amer 41 (*)    GFR calc Af Amer 48 (*)    All other components within normal limits  CBC WITH DIFFERENTIAL/PLATELET - Abnormal; Notable for the following components:   Platelets 119 (*)    Lymphs Abs 0.4 (*)    All other components within normal limits  SARS CORONAVIRUS 2 BY RT PCR (HOSPITAL ORDER, PERFORMED IN [facility])  ETHANOL  CBC WITH DIFFERENTIAL/PLATELET  URINALYSIS, ROUTINE W REFLEX MICROSCOPIC  CBC WITH DIFFERENTIAL/PLATELET  TROPONIN I (HIGH SENSITIVITY)  TROPONIN I (HIGH SENSITIVITY)   EKG EKG Interpretation  Date/Time:  Monday December 19 2019 08:03:14 EDT Ventricular Rate:  73 PR Interval:  154 QRS Duration: 98 QT Interval:  384 QTC Calculation: 423 R Axis:   -39 Text Interpretation: Normal sinus rhythm Left axis deviation Incomplete right bundle branch block Moderate voltage criteria for LVH, may be normal variant ( R in aVL , Cornell product ) Nonspecific ST and T wave abnormality Abnormal ECG Poor baseline Confirmed by Blane Ohara [phone]) on 12/19/2019 8:55:55 AM  Radiology CT Head Wo Contrast  Result Date: 12/19/2019 CLINICAL DATA:  Dizziness.  Fell. EXAM: CT HEAD WITHOUT CONTRAST CT CERVICAL SPINE WITHOUT CONTRAST TECHNIQUE: Multidetector CT imaging of the head and cervical spine was  performed following the standard protocol without intravenous contrast. Multiplanar CT image reconstructions of the cervical spine were also generated. COMPARISON:  04/24/2017 FINDINGS: CT HEAD FINDINGS Brain: Stable mild age related cerebral atrophy, ventriculomegaly and periventricular white matter disease. No extra-axial fluid collections are identified. No CT findings for acute hemispheric infarction or intracranial hemorrhage. No mass lesions. The brainstem and cerebellum are normal. Vascular: Scattered atherosclerotic calcifications but no aneurysm or hyperdense vessels. Skull: No skull fracture or bone lesions. Sinuses/Orbits: Moderate pansinus mucoperiosteal thickening. The mastoid air cells and middle ear cavities are clear. The globes are intact. Other: No scalp lesions or hematoma. CT CERVICAL SPINE FINDINGS Alignment: Normal Skull base and vertebrae: Moderate to advanced degenerative cervical spondylosis for age with multilevel disc disease and facet disease. No acute cervical spine fracture is identified. The facets are normally aligned. No facet or laminar fractures. Soft tissues and spinal canal: No prevertebral fluid or swelling. No visible canal hematoma. Disc levels: Fairly generous spinal canal. No large disc protrusions or significant canal stenosis. Mild to moderate bilateral foraminal narrowing due to uncinate spurring and facet disease. Upper chest: The lung apices are clear. Other: No neck mass or adenopathy. The thyroid gland is unremarkable. IMPRESSION: 1. Stable mild age related cerebral atrophy, ventriculomegaly and periventricular white matter disease. 2. No acute intracranial findings or skull fracture. 3. Moderate to advanced degenerative cervical spondylosis for age with multilevel disc disease and facet disease but no acute cervical spine fracture. Electronically Signed   By: Rudie Meyer M.D.   On: 12/19/2019 09:12   CT Cervical Spine Wo Contrast  Result Date:  12/19/2019 CLINICAL DATA:  Dizziness.  Fell. EXAM: CT HEAD WITHOUT CONTRAST CT CERVICAL SPINE WITHOUT CONTRAST TECHNIQUE: Multidetector CT imaging  of the head and cervical spine was performed following the standard protocol without intravenous contrast. Multiplanar CT image reconstructions of the cervical spine were also generated. COMPARISON:  04/24/2017 FINDINGS: CT HEAD FINDINGS Brain: Stable mild age related cerebral atrophy, ventriculomegaly and periventricular white matter disease. No extra-axial fluid collections are identified. No CT findings for acute hemispheric infarction or intracranial hemorrhage. No mass lesions. The brainstem and cerebellum are normal. Vascular: Scattered atherosclerotic calcifications but no aneurysm or hyperdense vessels. Skull: No skull fracture or bone lesions. Sinuses/Orbits: Moderate pansinus mucoperiosteal thickening. The mastoid air cells and middle ear cavities are clear. The globes are intact. Other: No scalp lesions or hematoma. CT CERVICAL SPINE FINDINGS Alignment: Normal Skull base and vertebrae: Moderate to advanced degenerative cervical spondylosis for age with multilevel disc disease and facet disease. No acute cervical spine fracture is identified. The facets are normally aligned. No facet or laminar fractures. Soft tissues and spinal canal: No prevertebral fluid or swelling. No visible canal hematoma. Disc levels: Fairly generous spinal canal. No large disc protrusions or significant canal stenosis. Mild to moderate bilateral foraminal narrowing due to uncinate spurring and facet disease. Upper chest: The lung apices are clear. Other: No neck mass or adenopathy. The thyroid gland is unremarkable. IMPRESSION: 1. Stable mild age related cerebral atrophy, ventriculomegaly and periventricular white matter disease. 2. No acute intracranial findings or skull fracture. 3. Moderate to advanced degenerative cervical spondylosis for age with multilevel disc disease and facet  disease but no acute cervical spine fracture. Electronically Signed   By: Rudie MeyerP.  Gallerani M.D.   On: 12/19/2019 09:12   Procedures Procedures (including critical care time)  Medications Ordered in ED Medications  sodium chloride 0.9 % bolus 500 mL (500 mLs Intravenous New Bag/Given 12/19/19 1032)   ED Course  I have reviewed the triage vital signs and the nursing notes.  Pertinent labs & imaging results that were available during my care of the patient were reviewed by me and considered in my medical decision making (see chart for details).   MDM Rules/Calculators/A&P                         69yo male with remote history of syncope had episode of syncope at home 3 days ago, continued to feel tired all weekend and increased pain in cervical spine and shoulders after fall. He was otherwise feeling well prior to the episode. It was unwitnessed but no post-ictal period, no incontinence, no h/o seizure.  H/o stroke and on plavix, ASA with a hematoma near eyes. CT head negative for acute intracranial bleed and CT neck neg for acute fracture. Echo in 2017 65-70%. Initial troponin, ECG, and no chest pain make ACS less likely but still concerned about cardiac etiology such as arrhythmia or valvular disease.  Patient feeling very tired but otherwise stable. Consult for assigned admission placed.  Final Clinical Impression(s) / ED Diagnoses Final diagnoses:  Fall, initial encounter  Syncope, unspecified syncope type   Rx / DC Orders ED Discharge Orders    None       Leeroy Bocknderson, Genesys Coggeshall L, DO 12/19/19 1309    Leeroy Bocknderson, Sherlyne Crownover L, DO 12/19/19 1352    Blane OharaZavitz, Joshua, MD 12/20/19 2333

## 2019-12-19 NOTE — ED Notes (Signed)
Meal Tray Ordered.  

## 2019-12-19 NOTE — ED Triage Notes (Signed)
Pt reports he was warming up some coffee went to sit down. Woke up on the floor. Denies blood thinner. Diaphoretic. Alert, dizzy.

## 2019-12-19 NOTE — H&P (Signed)
History and Physical    Onofre Gains KVQ:[id number] DOB: May 06, 1950 DOA: 12/19/2019  PCP: Renford Dills, MD Consultants:  None Patient coming from:  Home - lives with wife and kids; NOK: Wife, [place], [phone]  Chief Complaint: Syncope  HPI: Kayceon Oki is a 69 y.o. male with medical history significant of HTN; HLD; and h/o CVA presenting with syncope. He reports that he was microwaving coffee on Friday AM when he passed out.  He felt fine prior, but started to feel bad and so was walking to the kitchen table.  He awoke maybe 15 minutes later, lying in the kitchen floor.  He was unable to get up and so he dragged himself to the stairs and crawled up to wake up his wife.  He spent all weekend in bed.  He notices diffuse myalgias, particularly in his neck and shoulders.  He continues to notice dizziness and blurred vision lasting about 10 seconds after he turns his head.  He had another episode in 2018 while in [place].  His wife reports that a couple of months ago he had a similar episode to now - very tired, headaches, vertigo, but did not lose consciousness.  He also has a blockage in his left carotid (?) - was supposed to see a neurologist for monitoring.  He may not be compliant with meds, certainly has dietary indiscretion.  He was supposed to be urologist or nephrologist for his kidney elevation.   ED Course:  Syncope Friday, weakness since.  ACS evaluation negative, had CT negative.  On ASA/Plavix.    Review of Systems: As per HPI; otherwise review of systems reviewed and negative.   Ambulatory Status:  Ambulates without assistance  COVID Vaccine Status:  Complete - Laural Benes and Laural Benes  Past Medical History:  Diagnosis Date  . Hyperlipidemia   . Hypertension   . Mini stroke (HCC) 1996  . Osteoarthritis     Past Surgical History:  Procedure Laterality Date  . IR GENERIC HISTORICAL  11/01/2015   IR RADIOLOGIST EVAL & MGMT 11/01/2015 [facility]INTER[facility] RAD  . KNEE SURGERY   1976   right  . KNEE SURGERY  1980'2   right  . REPLACEMENT TOTAL KNEE  1996   left    Social History   Socioeconomic History  . Marital status: Divorced    Spouse name: Not on file  . Number of children: Not on file  . Years of education: Not on file  . Highest education level: Not on file  Occupational History  . Not on file  Tobacco Use  . Smoking status: Never Smoker  . Smokeless tobacco: Never Used  Substance and Sexual Activity  . Alcohol use: Yes    Alcohol/week: 0.0 standard drinks    Comment: occas.  . Drug use: No  . Sexual activity: Yes  Other Topics Concern  . Not on file  Social History Narrative   Lives with wife and 5 kids in a 2 story home.  Retired Emergency planning/management officer in [place].     Education: 12th grade.   Social Determinants of Health   Financial Resource Strain:   . Difficulty of Paying Living Expenses: Not on file  Food Insecurity:   . Worried About Programme researcher, broadcasting/film/video in the Last Year: Not on file  . Ran Out of Food in the Last Year: Not on file  Transportation Needs:   . Lack of Transportation (Medical): Not on file  . Lack of Transportation (Non-Medical): Not on file  Physical Activity:   . Days of Exercise per Week: Not on file  . Minutes of Exercise per Session: Not on file  Stress:   . Feeling of Stress : Not on file  Social Connections:   . Frequency of Communication with Friends and Family: Not on file  . Frequency of Social Gatherings with Friends and Family: Not on file  . Attends Religious Services: Not on file  . Active Member of Clubs or Organizations: Not on file  . Attends Banker Meetings: Not on file  . Marital Status: Not on file  Intimate Partner Violence:   . Fear of Current or Ex-Partner: Not on file  . Emotionally Abused: Not on file  . Physically Abused: Not on file  . Sexually Abused: Not on file    No Known Allergies  Family History  Problem Relation Age of Onset  . Cancer Mother         Deceased, 36  . Hypertension Mother   . Heart attack Father        Deceased  . Diabetes Brother        Deceased  . Autism Son   . Healthy Daughter     Prior to Admission medications   Medication Sig Start Date End Date Taking? Authorizing Provider  acetaminophen (TYLENOL) 500 MG tablet Take 2 tablets (1,000 mg total) by mouth every 6 (six) hours as needed for mild pain or headache. Patient not taking: Reported on 04/24/2017 07/27/15   Richarda Overlie, MD  amLODipine (NORVASC) 10 MG tablet Take 1 tablet (10 mg total) by mouth daily. 11/24/12   Leone Brand, NP  aspirin EC 81 MG EC tablet Take 1 tablet (81 mg total) by mouth daily. 01/16/15   Penny Pia, MD  clonazePAM (KLONOPIN) 0.5 MG tablet Take 1 tablet (0.5 mg total) by mouth 2 (two) times daily. Patient not taking: Reported on 04/24/2017 07/27/15   Richarda Overlie, MD  clopidogrel (PLAVIX) 75 MG tablet Take 75 mg by mouth daily. 04/03/17   [provider]  hydrALAZINE (APRESOLINE) 100 MG tablet Take 1 tablet (100 mg total) by mouth every 6 (six) hours. Patient not taking: Reported on 04/24/2017 07/27/15   Richarda Overlie, MD  HYDROcodone-acetaminophen (NORCO/VICODIN) 5-325 MG tablet Take 2 tablets by mouth every 6 (six) hours as needed for moderate pain. 04/24/17   Ward, Layla Maw, DO  losartan (COZAAR) 100 MG tablet Take 0.5 tablets (50 mg total) by mouth daily. 07/27/15   Richarda Overlie, MD  meclizine (ANTIVERT) 25 MG tablet Take 1 tablet (25 mg total) by mouth 2 (two) times daily as needed for dizziness. Patient not taking: Reported on 04/24/2017 08/24/15   Nita Sickle K, DO  ondansetron (ZOFRAN ODT) 4 MG disintegrating tablet Take 1 tablet (4 mg total) by mouth every 6 (six) hours as needed for nausea or vomiting. 04/24/17   Ward, Layla Maw, DO  oxyCODONE (OXY IR/ROXICODONE) 5 MG immediate release tablet Take 1 tablet (5 mg total) by mouth every 6 (six) hours as needed (Headache and pain). Patient not taking: Reported on 04/24/2017  07/27/15   Richarda Overlie, MD  simvastatin (ZOCOR) 40 MG tablet Take 0.5 tablets (20 mg total) by mouth every evening. Take 20 mg -half a tablet daily Patient taking differently: Take 40 mg by mouth daily.  11/24/12   Leone Brand, NP    Physical Exam: Vitals:   12/19/19 1315 12/19/19 1330 12/19/19 1345 12/19/19 1656  BP: (!) 165/82 (!) 161/85 Marland Kitchen)  202/86 (!) 177/91  Pulse: 79 74 68 74  Resp: 16 13 18 19   Temp:      TempSrc:      SpO2: 95% 97% 94% 98%  Weight:      Height:         . General:  Appears calm and comfortable and is NAD . Eyes:  PERRL, EOMI, marked periorbital R > L ecchymoses . ENT:  grossly normal hearing, lips & tongue, mmm; poor dentition . Neck:  no LAD, masses or thyromegaly; no carotid bruits . Cardiovascular:  RRR, no m/r/g. No LE edema.  Marland Kitchen. Respiratory:   CTA bilaterally with no wheezes/rales/rhonchi.  Normal respiratory effort. . Abdomen:  soft, NT, ND, NABS . Skin:  no rash or induration seen on limited exam; scattered ecchymoses and abrasions primarily on face . Musculoskeletal:  grossly normal tone BUE/BLE, good ROM, no bony abnormality . Psychiatric:  blunted mood and affect, speech fluent and appropriate, AOx3 . Neurologic:  CN 2-12 grossly intact, moves all extremities in coordinated fashion    Radiological Exams on Admission: CT Head Wo Contrast  Result Date: 12/19/2019 CLINICAL DATA:  Dizziness.  Fell. EXAM: CT HEAD WITHOUT CONTRAST CT CERVICAL SPINE WITHOUT CONTRAST TECHNIQUE: Multidetector CT imaging of the head and cervical spine was performed following the standard protocol without intravenous contrast. Multiplanar CT image reconstructions of the cervical spine were also generated. COMPARISON:  04/24/2017 FINDINGS: CT HEAD FINDINGS Brain: Stable mild age related cerebral atrophy, ventriculomegaly and periventricular white matter disease. No extra-axial fluid collections are identified. No CT findings for acute hemispheric infarction or intracranial  hemorrhage. No mass lesions. The brainstem and cerebellum are normal. Vascular: Scattered atherosclerotic calcifications but no aneurysm or hyperdense vessels. Skull: No skull fracture or bone lesions. Sinuses/Orbits: Moderate pansinus mucoperiosteal thickening. The mastoid air cells and middle ear cavities are clear. The globes are intact. Other: No scalp lesions or hematoma. CT CERVICAL SPINE FINDINGS Alignment: Normal Skull base and vertebrae: Moderate to advanced degenerative cervical spondylosis for age with multilevel disc disease and facet disease. No acute cervical spine fracture is identified. The facets are normally aligned. No facet or laminar fractures. Soft tissues and spinal canal: No prevertebral fluid or swelling. No visible canal hematoma. Disc levels: Fairly generous spinal canal. No large disc protrusions or significant canal stenosis. Mild to moderate bilateral foraminal narrowing due to uncinate spurring and facet disease. Upper chest: The lung apices are clear. Other: No neck mass or adenopathy. The thyroid gland is unremarkable. IMPRESSION: 1. Stable mild age related cerebral atrophy, ventriculomegaly and periventricular white matter disease. 2. No acute intracranial findings or skull fracture. 3. Moderate to advanced degenerative cervical spondylosis for age with multilevel disc disease and facet disease but no acute cervical spine fracture. Electronically Signed   By: Rudie MeyerP.  Gallerani M.D.   On: 12/19/2019 09:12   CT Cervical Spine Wo Contrast  Result Date: 12/19/2019 CLINICAL DATA:  Dizziness.  Fell. EXAM: CT HEAD WITHOUT CONTRAST CT CERVICAL SPINE WITHOUT CONTRAST TECHNIQUE: Multidetector CT imaging of the head and cervical spine was performed following the standard protocol without intravenous contrast. Multiplanar CT image reconstructions of the cervical spine were also generated. COMPARISON:  04/24/2017 FINDINGS: CT HEAD FINDINGS Brain: Stable mild age related cerebral atrophy,  ventriculomegaly and periventricular white matter disease. No extra-axial fluid collections are identified. No CT findings for acute hemispheric infarction or intracranial hemorrhage. No mass lesions. The brainstem and cerebellum are normal. Vascular: Scattered atherosclerotic calcifications but no aneurysm or hyperdense vessels. Skull:  No skull fracture or bone lesions. Sinuses/Orbits: Moderate pansinus mucoperiosteal thickening. The mastoid air cells and middle ear cavities are clear. The globes are intact. Other: No scalp lesions or hematoma. CT CERVICAL SPINE FINDINGS Alignment: Normal Skull base and vertebrae: Moderate to advanced degenerative cervical spondylosis for age with multilevel disc disease and facet disease. No acute cervical spine fracture is identified. The facets are normally aligned. No facet or laminar fractures. Soft tissues and spinal canal: No prevertebral fluid or swelling. No visible canal hematoma. Disc levels: Fairly generous spinal canal. No large disc protrusions or significant canal stenosis. Mild to moderate bilateral foraminal narrowing due to uncinate spurring and facet disease. Upper chest: The lung apices are clear. Other: No neck mass or adenopathy. The thyroid gland is unremarkable. IMPRESSION: 1. Stable mild age related cerebral atrophy, ventriculomegaly and periventricular white matter disease. 2. No acute intracranial findings or skull fracture. 3. Moderate to advanced degenerative cervical spondylosis for age with multilevel disc disease and facet disease but no acute cervical spine fracture. Electronically Signed   By: Rudie Meyer M.D.   On: 12/19/2019 09:12    EKG: Independently reviewed.  NSR with rate 73; nonspecific ST changes with no evidence of acute ischemia   Labs on Admission: I have personally reviewed the available labs and imaging studies at the time of the admission.  Pertinent labs:   CO2 19 Glucose 134 BUN 16/Creatinine 1.66/GFR 48; 19/1.52/53  in 03/2017 HS troponin 12, 12 WBC 4.1 Platelets 119 ETOH < 10 COVID POSITIVE   Assessment/Plan Principal Problem:   Syncope Active Problems:   HTN (hypertension)   Hyperlipidemia   Abnormal computed tomography angiography of brain   CKD (chronic kidney disease), stage III   COVID-19 virus infection   Syncope -Patient with prior h/o syncope and high-grade carotid stenosis as well as severe intracranial atherosclerotic change in 03/2017  -Also found to have COVID-19 infection, which also may be the source of his symptoms -Will monitor on telemetry -MRI/A-brain -Carotid dopplers -2d echo -Neuro checks  -check A1c, FLP, TSH -Continue ASA and Plavix -PT/OT eval and treat  COVID-19 Infection -Patient with presenting with syncope, fatigue, dizziness, myalgias -No apparent respiratory symptoms -He was vaccinated with the J&J vaccine -COVID POSITIVE -The patient has comorbidities which may increase the risk for ARDS/MODS including: age, HTN, obesity -COVID labs are pending -CXR ordered -Monitor on telemetry x at least 24 hours -At this time, will attempt to avoid use of aerosolized medications and use HFAs instead -Will check daily labs including BMP with Mag, Phos; LFTs; CBC with differential; CRP; ferritin; fibrinogen; D-dimer -Given only mild symptoms, will treat with monoclonal Ab infusion - patient counseled and wife also agrees to infusion  H/o abnl CTA head -CTA in 03/2017 showed 70% carotid stenosis at the bifurcation as well as severe intracranial atherosclerotic change without stenosis -No CTA given renal dysfunction -Will order MRI/MRA -Will also order carotid dopplers -Continue ASA, Plavix  HTN -Continue Norvasc -Hold Cozaar for now -Resume hydralazine  HLD -Continue Zocor but consider transition to high-dose statin  Stage 3a CKD -Patient with progressive stage 3 CKD - still 3a but advancing -Would benefit from improved BP control as well as outpatient  nephrology f/u -Hold Cozaar for now  Obesity -Body mass index is 34.69 kg/m. -Weight loss should be encouraged -Outpatient PCP/bariatric medicine/bariatric surgery f/u encouraged    DVT prophylaxis:  Lovenox  Code Status:  Full - confirmed with patient Family Communication: None present; I spoke with the patient's  wife by telephone. Disposition Plan:  The patient is from: home  Anticipated d/c is to: home   Anticipated d/c date will depend on clinical response to treatment, possibly as early as tomorrow if he has an excellent clinical response  Patient is currently: acutely ill Consults called: PT/OT Admission status: It is my clinical opinion that referral for OBSERVATION is reasonable and necessary in this patient based on the above information provided. The aforementioned taken together are felt to place the patient at high risk for further clinical deterioration. However it is anticipated that the patient may be medically stable for discharge from the hospital within 24 to 48 hours.   Jonah Blue MD [facility]   How to contact the [facility] Attending or Consulting provider 7A - 7P or covering provider during after hours 7P -7A, for this patient?  1. Check the care team in [facility] and look for a) attending/consulting [facility] provider listed and b) the [facility] team listed 2. Log into [email or web address] and use [facility] universal password to access. If you do not have the password, please contact the hospital operator. 3. Locate the [facility] provider you are looking for under [facility] and page to a number that you can be directly reached. 4. If you still have difficulty reaching the provider, please page the [facility] (Director on Call) for the Hospitalists listed on amion for assistance.   12/19/2019, 5:18 PM

## 2019-12-19 NOTE — ED Notes (Signed)
Called and spoke with lab, they will add on HgA1C and lipid panel.

## 2019-12-19 NOTE — ED Notes (Signed)
Patient updated on plan of care, MRI pending transport. Provided with surgical mask per his request. Patient taking PO fluids independently without issues. Room safe, call bell in reach. Denies further questions/needs.

## 2019-12-19 NOTE — ED Notes (Signed)
Pt was transported from stretcher to hospital bed for comfort.  Bedside meal tray present.

## 2019-12-19 NOTE — ED Notes (Signed)
Patient updated on plan of care and pending bed assignment, verbalized understanding, denies further needs

## 2019-12-19 NOTE — ED Notes (Signed)
CBG 88 

## 2019-12-19 NOTE — ED Notes (Signed)
Dr Ophelia Charter aware of bp.

## 2019-12-19 NOTE — ED Notes (Signed)
Echo being completed at bedside.

## 2019-12-19 NOTE — Progress Notes (Signed)
  Echocardiogram 2D Echocardiogram has been performed.  Pieter Partridge 12/19/2019, 4:12 PM

## 2019-12-19 NOTE — ED Notes (Signed)
Patient transported to MRI 

## 2019-12-20 ENCOUNTER — Observation Stay (HOSPITAL_COMMUNITY): Payer: Medicare HMO

## 2019-12-20 DIAGNOSIS — N1831 Chronic kidney disease, stage 3a: Secondary | ICD-10-CM | POA: Diagnosis present

## 2019-12-20 DIAGNOSIS — Z7982 Long term (current) use of aspirin: Secondary | ICD-10-CM | POA: Diagnosis not present

## 2019-12-20 DIAGNOSIS — Z7901 Long term (current) use of anticoagulants: Secondary | ICD-10-CM | POA: Diagnosis not present

## 2019-12-20 DIAGNOSIS — Z8249 Family history of ischemic heart disease and other diseases of the circulatory system: Secondary | ICD-10-CM | POA: Diagnosis not present

## 2019-12-20 DIAGNOSIS — H5789 Other specified disorders of eye and adnexa: Secondary | ICD-10-CM | POA: Diagnosis present

## 2019-12-20 DIAGNOSIS — E785 Hyperlipidemia, unspecified: Secondary | ICD-10-CM | POA: Diagnosis present

## 2019-12-20 DIAGNOSIS — I129 Hypertensive chronic kidney disease with stage 1 through stage 4 chronic kidney disease, or unspecified chronic kidney disease: Secondary | ICD-10-CM | POA: Diagnosis present

## 2019-12-20 DIAGNOSIS — U071 COVID-19: Secondary | ICD-10-CM | POA: Diagnosis present

## 2019-12-20 DIAGNOSIS — R55 Syncope and collapse: Secondary | ICD-10-CM | POA: Diagnosis present

## 2019-12-20 DIAGNOSIS — M109 Gout, unspecified: Secondary | ICD-10-CM | POA: Diagnosis present

## 2019-12-20 DIAGNOSIS — I672 Cerebral atherosclerosis: Secondary | ICD-10-CM | POA: Diagnosis present

## 2019-12-20 DIAGNOSIS — E669 Obesity, unspecified: Secondary | ICD-10-CM | POA: Diagnosis present

## 2019-12-20 DIAGNOSIS — Z6834 Body mass index (BMI) 34.0-34.9, adult: Secondary | ICD-10-CM | POA: Diagnosis not present

## 2019-12-20 DIAGNOSIS — Z713 Dietary counseling and surveillance: Secondary | ICD-10-CM | POA: Diagnosis not present

## 2019-12-20 DIAGNOSIS — Z96652 Presence of left artificial knee joint: Secondary | ICD-10-CM | POA: Diagnosis present

## 2019-12-20 DIAGNOSIS — Z79899 Other long term (current) drug therapy: Secondary | ICD-10-CM | POA: Diagnosis not present

## 2019-12-20 DIAGNOSIS — I10 Essential (primary) hypertension: Secondary | ICD-10-CM | POA: Diagnosis not present

## 2019-12-20 DIAGNOSIS — W1839XA Other fall on same level, initial encounter: Secondary | ICD-10-CM | POA: Diagnosis present

## 2019-12-20 DIAGNOSIS — Z8673 Personal history of transient ischemic attack (TIA), and cerebral infarction without residual deficits: Secondary | ICD-10-CM | POA: Diagnosis not present

## 2019-12-20 DIAGNOSIS — N179 Acute kidney failure, unspecified: Secondary | ICD-10-CM | POA: Diagnosis present

## 2019-12-20 LAB — CBC WITH DIFFERENTIAL/PLATELET
Abs Immature Granulocytes: 0.01 10*3/uL (ref 0.00–0.07)
Basophils Absolute: 0 10*3/uL (ref 0.0–0.1)
Basophils Relative: 0 %
Eosinophils Absolute: 0 10*3/uL (ref 0.0–0.5)
Eosinophils Relative: 0 %
HCT: 44.1 % (ref 39.0–52.0)
Hemoglobin: 14.1 g/dL (ref 13.0–17.0)
Immature Granulocytes: 0 %
Lymphocytes Relative: 10 %
Lymphs Abs: 0.5 10*3/uL — ABNORMAL LOW (ref 0.7–4.0)
MCH: 28.4 pg (ref 26.0–34.0)
MCHC: 32 g/dL (ref 30.0–36.0)
MCV: 88.9 fL (ref 80.0–100.0)
Monocytes Absolute: 0.4 10*3/uL (ref 0.1–1.0)
Monocytes Relative: 9 %
Neutro Abs: 3.8 10*3/uL (ref 1.7–7.7)
Neutrophils Relative %: 81 %
Platelets: 123 10*3/uL — ABNORMAL LOW (ref 150–400)
RBC: 4.96 MIL/uL (ref 4.22–5.81)
RDW: 13.5 % (ref 11.5–15.5)
WBC: 4.8 10*3/uL (ref 4.0–10.5)
nRBC: 0 % (ref 0.0–0.2)

## 2019-12-20 LAB — COMPREHENSIVE METABOLIC PANEL
ALT: 21 U/L (ref 0–44)
AST: 23 U/L (ref 15–41)
Albumin: 3.5 g/dL (ref 3.5–5.0)
Alkaline Phosphatase: 49 U/L (ref 38–126)
Anion gap: 13 (ref 5–15)
BUN: 18 mg/dL (ref 8–23)
CO2: 19 mmol/L — ABNORMAL LOW (ref 22–32)
Calcium: 8.8 mg/dL — ABNORMAL LOW (ref 8.9–10.3)
Chloride: 104 mmol/L (ref 98–111)
Creatinine, Ser: 1.25 mg/dL — ABNORMAL HIGH (ref 0.61–1.24)
GFR calc Af Amer: >60 mL/min (ref >60)
GFR calc non Af Amer: 58 mL/min — ABNORMAL LOW (ref >60)
Glucose, Bld: 136 mg/dL — ABNORMAL HIGH (ref 70–99)
Potassium: 3.7 mmol/L (ref 3.5–5.1)
Sodium: 136 mmol/L (ref 135–145)
Total Bilirubin: 0.8 mg/dL (ref 0.3–1.2)
Total Protein: 7.4 g/dL (ref 6.5–8.1)

## 2019-12-20 LAB — GLUCOSE, CAPILLARY
Glucose-Capillary: 134 mg/dL — ABNORMAL HIGH (ref 70–99)
Glucose-Capillary: 139 mg/dL — ABNORMAL HIGH (ref 70–99)
Glucose-Capillary: 110 mg/dL — ABNORMAL HIGH (ref 70–99)
Glucose-Capillary: 125 mg/dL — ABNORMAL HIGH (ref 70–99)
Glucose-Capillary: 95 mg/dL (ref 70–99)

## 2019-12-20 LAB — C-REACTIVE PROTEIN: CRP: 12.3 mg/dL — ABNORMAL HIGH (ref <1.0)

## 2019-12-20 LAB — D-DIMER, QUANTITATIVE: D-Dimer, Quant: 0.6 ug/mL-FEU — ABNORMAL HIGH (ref 0.00–0.50)

## 2019-12-20 LAB — PHOSPHORUS: Phosphorus: 3 mg/dL (ref 2.5–4.6)

## 2019-12-20 LAB — FERRITIN: Ferritin: 92 ng/mL (ref 24–336)

## 2019-12-20 LAB — MAGNESIUM: Magnesium: 1.9 mg/dL (ref 1.7–2.4)

## 2019-12-20 MED ORDER — CYCLOBENZAPRINE HCL 5 MG PO TABS
5.0000 mg | ORAL_TABLET | Freq: Three times a day (TID) | ORAL | Status: DC | PRN
  Administered 2019-12-21: 5 mg via ORAL
  Filled 2019-12-20 (×2): qty 1

## 2019-12-20 NOTE — Evaluation (Signed)
Occupational Therapy Evaluation Patient Details Name: Alexander Gonzalez MRN: [id number] DOB: 1950/11/08 Today's Date: 12/20/2019    History of Present Illness 69 yo male presenting with syncopal episode; COVID +. MRI of head negative. CT of neck negative.  PMH including CVA, HTN, and HLD.   Clinical Impression   PTA, pt was living with his wife and two children and was independent. Pt currently requiring Max A for ADLs and bed mobility due to significant pain limiting functional performance. Pt requiring Max A +2 for sitting upright and reporting significant pain at neck and back of head upon sitting; crying. Returned to side lying with Max A +2. Pt reporting pain more significant than at home over the weekend. No noted nystagmus. VSS. Pt would benefit from further acute OT to facilitate safe dc. Pending progress, recommend dc to SNF for further OT to optimize safety, independence with ADLs, and return to PLOF.   BP supine 148/68. BP EOB 139/75.  BP sidelying 161/64.  HR 80-90s. SpO2 >94% on RA    Follow Up Recommendations  SNF (Pending progress)    Equipment Recommendations  Other (comment) (Defer to next venue)    Recommendations for Other Services PT consult     Precautions / Restrictions Precautions Precautions: Fall;Other (comment) Precaution Comments: Limited by pain      Mobility Bed Mobility Overal bed mobility: Needs Assistance Bed Mobility: Rolling;Sidelying to Sit;Sit to Sidelying Rolling: Min assist Sidelying to sit: Max assist;+2 for physical assistance     Sit to sidelying: Max assist;+2 for physical assistance General bed mobility comments: Pt able to roll to right with Min A for facilitating hips. Max A to bring BLEs over EOB and then elevate trunk. Max A +2 for return to sidelying  Transfers Overall transfer level: Needs assistance   Transfers: Lateral/Scoot Transfers          Lateral/Scoot Transfers: Min guard;+2 safety/equipment General transfer  comment: Able to lateral scoot towards HOB with MIn guard A +2 for safety    Balance                                           ADL either performed or assessed with clinical judgement   ADL Overall ADL's : Needs assistance/impaired                                       General ADL Comments: Requiring Max A for ADLs and bed mobility. Sitting at EOB and reporting significant next pain. Vitals stable throughoutEval limited by neck pain     Vision         Perception     Praxis      Pertinent Vitals/Pain Pain Assessment: Faces Faces Pain Scale: Hurts whole lot Pain Intervention(s): Monitored during session;Limited activity within patient's tolerance;Repositioned;Heat applied     Hand Dominance Left   Extremity/Trunk Assessment Upper Extremity Assessment Upper Extremity Assessment: Overall WFL for tasks assessed   Lower Extremity Assessment Lower Extremity Assessment: Defer to PT evaluation   Cervical / Trunk Assessment Cervical / Trunk Assessment: Other exceptions Cervical / Trunk Exceptions: CT neg at cervical spine. Increased nack pain   Communication Communication Communication: No difficulties   Cognition Arousal/Alertness: Awake/alert Behavior During Therapy: Anxious Overall Cognitive Status: No family/caregiver present to determine baseline cognitive functioning  General Comments: Quick anxiety with pain. Diffculty relaxing.   General Comments  BP supine 148/68. BP EOB 139/75. BP sidelying 161/64. HR 80-90s. SpO2 >94% on RA    Exercises     Shoulder Instructions      Home Living Family/patient expects to be discharged to:: Private residence Living Arrangements: Spouse/significant other;Children (29 yo twins) Available Help at Discharge: Family;Available 24 hours/day Type of Home: House Home Access: Stairs to enter [employer] of Steps: 2 Entrance Stairs-Rails:  None Home Layout: Two level;1/2 bath on main level;Bed/bath upstairs Alternate Level Stairs-Number of Steps: Flight Alternate Level Stairs-Rails: Left Bathroom Shower/Tub: Chief Strategy Officer: Standard     Home Equipment: Crutches          Prior Functioning/Environment Level of Independence: Independent        Comments: ADLs, IADLs, drives        OT Problem List: Decreased range of motion;Impaired balance (sitting and/or standing);Decreased activity tolerance;Decreased cognition;Decreased safety awareness;Decreased knowledge of use of DME or AE;Decreased knowledge of precautions;Pain      OT Treatment/Interventions: Self-care/ADL training;Therapeutic exercise;Energy conservation;DME and/or AE instruction;Therapeutic activities;Patient/family education    OT Goals(Current goals can be found in the care plan section) Acute Rehab OT Goals Patient Stated Goal: Stop pain OT Goal Formulation: With patient Time For Goal Achievement: 01/03/20 Potential to Achieve Goals: Good  OT Frequency: Min 2X/week   Barriers to D/C:            Co-evaluation PT/OT/SLP Co-Evaluation/Treatment: Yes Reason for Co-Treatment: For patient/therapist safety;To address functional/ADL transfers   OT goals addressed during session: ADL's and self-care      AM-PAC OT "6 Clicks" Daily Activity     Outcome Measure Help from another person eating meals?: A Little Help from another person taking care of personal grooming?: A Little Help from another person toileting, which includes using toliet, bedpan, or urinal?: A Lot Help from another person bathing (including washing, rinsing, drying)?: A Lot Help from another person to put on and taking off regular upper body clothing?: A Lot Help from another person to put on and taking off regular lower body clothing?: A Lot 6 Click Score: 14   End of Session Nurse Communication: Mobility status;Other (comment) (increased neck  pain)  Activity Tolerance: Patient limited by pain Patient left: in bed;with call bell/phone within reach;with bed alarm set  OT Visit Diagnosis: Unsteadiness on feet (R26.81);Other abnormalities of gait and mobility (R26.89);Muscle weakness (generalized) (M62.81);Pain Pain - part of body:  (neck and back of head)                Time: 2595-6387 OT Time Calculation (min): 17 min Charges:  OT General Charges $OT Visit: 1 Visit OT Evaluation $OT Eval Moderate Complexity: 1 Mod  Nikyla Navedo MSOT, OTR/L Acute Rehab Pager: [phone] Office: [phone]  Theodoro Grist Ramere Downs 12/20/2019, 12:51 PM

## 2019-12-20 NOTE — ED Notes (Signed)
Patient is resting comfortably. NAD noted. Vitals stable. Awaiting bed assignment.

## 2019-12-20 NOTE — Progress Notes (Signed)
The patient had a 13 beat run of v-tach and was asymptomatic, MD notified. No new orders, will continue to monitor.

## 2019-12-20 NOTE — Evaluation (Addendum)
Physical Therapy Evaluation Patient Details Name: Alexander Gonzalez MRN: [id number] DOB: September 24, 1950 Today's Date: 12/20/2019   History of Present Illness  Pt is 69 yo male presenting with syncopal episode and found to be COVID +. MRI of head negative. CT of neck negative.  Carotid U/S : pending. PMH including CVA, HTN, and HLD.  Clinical Impression   Pt admitted with above diagnosis. Evaluation was significantly limited due to head and neck pain.  His orthostatic BP was stable with supine and sitting, unable to get standing.  HR and O2 sats stable but RR increased due to pain.  Had c/o dizziness with turning head at admission, but unable to test vestibular due to pain (did not note nystagmus with transfers).  Pt providing vague answers at times and with decreased cognition - but question if related to distraction due to pain.  After several mins in sidelying pain eased.  At this time recommend SNF due to pt requiring max A x 2 for transfers; however, may be able to progress as today was mostly limited due to pain. Pt currently with functional limitations due to the deficits listed below (see PT Problem List). Pt will benefit from skilled PT to increase their independence and safety with mobility to allow discharge to the venue listed below.       Follow Up Recommendations SNF (Pending progress)    Equipment Recommendations  Rolling walker with 5" wheels;3in1 (PT);Other (comment) (needs further assessment)    Recommendations for Other Services       Precautions / Restrictions Precautions Precautions: Fall;Other (comment) Precaution Comments: Limited by pain      Mobility  Bed Mobility Overal bed mobility: Needs Assistance Bed Mobility: Rolling;Sidelying to Sit;Sit to Sidelying Rolling: Min assist Sidelying to sit: Max assist;+2 for physical assistance     Sit to sidelying: Max assist;+2 for physical assistance General bed mobility comments: Pt able to roll to right with Min A for  facilitating hips. Max A to bring BLEs over EOB and then elevate trunk. Max A +2 for return to sidelying. Limited due to pain  Transfers Overall transfer level: Needs assistance   Transfers: Lateral/Scoot Transfers          Lateral/Scoot Transfers: Min guard;+2 safety/equipment General transfer comment: Able to lateral scoot towards HOB with MIn guard A +2 for safety; declined attempts to stand and move toward [facility] due to pain  Ambulation/Gait                Stairs            Wheelchair Mobility    Modified Rankin (Stroke Patients Only)       Balance Overall balance assessment: Needs assistance Sitting-balance support: Bilateral upper extremity supported;Feet supported Sitting balance-Leahy Scale: Poor Sitting balance - Comments: Pt pushing back to lay down due to pain; when scooting toward HOB maintained balance       Standing balance comment: defered                             Pertinent Vitals/Pain Pain Assessment: Faces Faces Pain Scale: Hurts whole lot Pain Location: back of head and neck Pain Descriptors / Indicators: Crying;Grimacing;Moaning Pain Intervention(s): Monitored during session;Limited activity within patient's tolerance;Heat applied;Repositioned (notified MD)    Home Living Family/patient expects to be discharged to:: Private residence Living Arrangements: Spouse/significant other;Children (55 yo twins) Available Help at Discharge: Family;Available 24 hours/day Type of Home: House Home Access: Stairs to enter  Entrance Stairs-Rails: None Entrance Stairs-Number of Steps: 2 Home Layout: Two level;1/2 bath on main level;Bed/bath upstairs Home Equipment: Crutches      Prior Function Level of Independence: Independent         Comments: ADLs, IADLs, drives     Hand Dominance   Dominant Hand: Left    Extremity/Trunk Assessment   Upper Extremity Assessment Upper Extremity Assessment: Defer to OT evaluation     Lower Extremity Assessment Lower Extremity Assessment: Overall WFL for tasks assessed (Limited testing due to c/o neck/head pain.  Demonstrating at least 3/5 strength)    Cervical / Trunk Assessment Cervical / Trunk Assessment: Other exceptions Cervical / Trunk Exceptions: Pt with c/o severe posterior head and neck pain.  Imaging negative for acute changes.  Palpated increased tension in neck/shoulder muscles  Communication   Communication: No difficulties  Cognition Arousal/Alertness: Awake/alert Behavior During Therapy: Anxious Overall Cognitive Status: No family/caregiver present to determine baseline cognitive functioning                                 General Comments: Cognition difficult to assess as pt anxious and focused on pain      General Comments General comments (skin integrity, edema, etc.): BP supine 148/68. BP EOB 139/75. BP sidelying 161/64. HR 80-90s. SpO2 >94% on RA.  No c/o syncope or dizziness - only pain   At arrival pt reports hx of syncope and nervous about getting up, so performed PT/OT coeval.  Pt reports he has had syncope and dizziness at home.  States dizziness began before the fall and occurs when he gets up.  Pt providing vague answers as he was distracted by pain.  Made the decision to focus on mobility and orthostatic BP then f/u for further vestibular testing.  Pt transferred to EOB and BP was stable.  Pt had no c/o dizziness/syncope, but was distracted by pain.  Reports severe neck/head pain and was crying and begging to return to supine.  Returned to sidelying and attempted several positions for comfort. Pain eased after several minutes.  Applied heat pack in pillow case to neck.  Unable to test vestibular at that point due to pain. Notified MD of pt's severe pain.      Exercises     Assessment/Plan    PT Assessment Patient needs continued PT services  PT Problem List Decreased strength;Decreased mobility;Decreased safety  awareness;Decreased range of motion;Decreased activity tolerance;Decreased cognition;Cardiopulmonary status limiting activity;Decreased balance;Decreased knowledge of use of DME;Pain       PT Treatment Interventions DME instruction;Therapeutic activities;Modalities;Gait training;Therapeutic exercise;Patient/family education;Stair training;Balance training;Functional mobility training    PT Goals (Current goals can be found in the Care Plan section)  Acute Rehab PT Goals Patient Stated Goal: Stop pain PT Goal Formulation: With patient Time For Goal Achievement: 01/03/20 Potential to Achieve Goals: Good    Frequency Min 3X/week   Barriers to discharge        Co-evaluation PT/OT/SLP Co-Evaluation/Treatment: Yes Reason for Co-Treatment: For patient/therapist safety (reports syncope) PT goals addressed during session: Mobility/safety with mobility OT goals addressed during session: ADL's and self-care       AM-PAC PT "6 Clicks" Mobility  Outcome Measure Help needed turning from your back to your side while in a flat bed without using bedrails?: A Lot Help needed moving from lying on your back to sitting on the side of a flat bed without using bedrails?: Total Help needed moving to  and from a bed to a chair (including a wheelchair)?: Total Help needed standing up from a chair using your arms (e.g., wheelchair or bedside chair)?: Total Help needed to walk in hospital room?: Total Help needed climbing 3-5 steps with a railing? : Total 6 Click Score: 7    End of Session   Activity Tolerance: Patient limited by pain Patient left: in bed;with call bell/phone within reach;with bed alarm set Nurse Communication: Mobility status PT Visit Diagnosis: Pain;Other abnormalities of gait and mobility (R26.89) Pain - part of body:  (head/neck)    Time: 1749-4496 PT Time Calculation (min) (ACUTE ONLY): 23 min   Charges:   PT Evaluation $PT Eval Moderate Complexity: 1 Andi Hence, PT Acute Rehab Services Pager [phone] [facility] [phone]    Rayetta Humphrey 12/20/2019, 1:34 PM

## 2019-12-20 NOTE — Progress Notes (Addendum)
PROGRESS NOTE                                                                                                                                                                                                             Patient Demographics:    Alexander Gonzalez, is a 69 y.o. male, DOB - September 25, 1950, CVE:[id number]  Outpatient Primary MD for the patient is Alexander Dills, MD   Admit date - 12/19/2019   LOS - 0  Chief Complaint  Patient presents with  . Fall  . Head Injury  . Dizziness       Brief Narrative: Patient is a 69 y.o. male with PMHx of CVA, HTN, HLD-who has been feeling very fatigued/tired, feverish, myalgias for the past 3-4 days-this past Friday-while making coffee-patient felt more weak-and while attempting to walk to a chair he apparently passed out.  Throughout the weekend-he continued to have myalgias and fever and weakness-he gradually developed some shortness of breath-he subsequently presented to the emergency room-where he was found to have COVID-19, mild AKI on CKD stage IIIa-and subsequently admitted to the hospitalist service.  See below for further details.  COVID-19 vaccinated status: Vaccinated-around May-J&J vaccine.  Significant Events: 9/20>> presented to [facility] ED-syncope x3 days prior to presentation-worsening weakness, fever/myalgias/exertional shortness of breath.  Significant studies: 9/20>> CT head: No acute intracranial findings/skull fracture. 9/20>> CT C-spine: No acute cervical spine fracture-moderate to advanced degenerative cervical spondylosis 9/20>> MRI brain: No acute intracranial abnormality 9/20>> MRI brain: Negative intracranial MRA for acute large vessel occlusion, diffuse intracranial atherosclerotic change 9/20>> Echo: EF 60-65%, grade 1 diastolic dysfunction 9/20>> chest x-ray: No acute intrathoracic process. 9/21>> chest x-ray: No obvious pneumonia. 9/21>> carotid  ultrasound: Pending  COVID-19 medications: Casirivimab-imdevimab: 9/20 x1   Antibiotics: None  Microbiology data: None  Procedures: None  Consults: None  DVT prophylaxis: Prophylactic Lovenox   Subjective:    Alexander Gonzalez today continues to complain of myalgias, fatigue/malaise-claims that just walking around the room makes him very short winded.  He feels worse today compared to yesterday.   Assessment  & Plan :   Syncope: Probably related to vasovagal syncope in the setting of acute infection.  Check orthostatics to rule out orthostatic hypotension.  Telemetry unremarkable-echo with preserved EF.  COVID-19 infection: Still feels "miserable"-continues to have myalgias-claims that even walking around the room makes  him short of breath.  He appears stable at rest and is on room air.  Repeat chest x-ray this morning without pneumonia-but CRP is significantly elevated-suspect that he probably has pneumonia not seen on chest x-ray-given his symptomatology.  He is at high risk for severe disease given his medical comorbidities-plan is to observe inpatient for 1 more day-repeat chest x-ray in the morning.  If in the meantime-he develops hypoxemia-we will go ahead and start him on steroids/remdesivir.  In the meantime, await evaluation by PT services to see how he does with ambulation.  COVID-19 Labs  Recent Labs    12/19/19 1637 12/20/19 0309  DDIMER 0.69* 0.60*  FERRITIN 105 92  LDH 131  --   CRP 12.3* 12.3*    Lab Results  Component Value Date   SARSCOV2NAA POSITIVE (A) 12/19/2019    Mild AKI on CKD stage IIIa: Creatinine slowly improving with just supportive care.  Suspect AKI was secondary to acute infection/hemodynamically mediated kidney injury in the setting of COVID-19.  HTN: BP reasonably controlled-fluctuating-continue Norvasc and hydralazine-losartan on hold due to mild AKI.    HLD: Continue statin  History of CVA-no reported history of known carotid  stenosis: Reviewed carotid angiogram done in 2017-await carotid ultrasound.  On aspirin/Plavix/statin.  Obesity: Estimated body mass index is 34.11 kg/m as calculated from the following:   Height as of this encounter:  (1.93 m).   Weight as of this encounter: 127.1 kg.      ABG:    Component Value Date/Time   TCO2 22 07/19/2015 1122    Vent Settings: N/A  Condition - Stable  Family Communication  : Left message for spouse spouse(Alexander Gonzalez-[phone]) this morning.  Addendum: Wife called back-apparently patient has been hesitant to proceed with a carotid endarterectomy in the past-due to fear of general anesthesia-he has lost follow-up with neurology.  Explained to her that we need to watch him closely for worsening Covid symptoms-and that carotid endarterectomy/vascular surgery evaluation could be done when he is a bit more stable.  Code Status :  Full Code  Diet :  Diet Order            Diet Heart Room service appropriate? Yes; Fluid consistency: Thin  Diet effective now                  Disposition Plan  :   Status is: Observation  The patient will require care spanning > 2 midnights and should be moved to inpatient because: Ongoing diagnostic testing needed not appropriate for outpatient work up and Inpatient level of care appropriate due to severity of illness  Dispo: The patient is from: Home              Anticipated d/c is to: Home              Anticipated d/c date is: 2 days              Patient currently is not medically stable to d/c.   Barriers to discharge: Hypoxia requiring O2 supplementation/complete 5 days of IV Remdesivir  Antimicorbials  :    Anti-infectives (From admission, onward)   None      Inpatient Medications  Scheduled Meds: . amLODipine  10 mg Oral Daily  . aspirin EC  81 mg Oral Daily  . clopidogrel  75 mg Oral Daily  . enoxaparin (LOVENOX) injection  60 mg Subcutaneous Q24H  . hydrALAZINE  100 mg Oral Q6H  . insulin  aspart  0-15 Units Subcutaneous TID WC  . insulin aspart  0-5 Units Subcutaneous QHS  . simvastatin  20 mg Oral QHS  . sodium chloride flush  3 mL Intravenous Q12H   Continuous Infusions: . sodium chloride    . famotidine (PEPCID) IV     PRN Meds:.sodium chloride, acetaminophen **OR** acetaminophen, albuterol, diphenhydrAMINE, EPINEPHrine, famotidine (PEPCID) IV, HYDROcodone-acetaminophen, methylPREDNISolone (SOLU-MEDROL) injection, morphine injection, ondansetron **OR** ondansetron (ZOFRAN) IV   Time Spent in minutes  25  See all Orders from today for further details   Jeoffrey Massed M.D on 12/20/2019 at 10:09 AM  To page go to [email or web address] - use universal password  [facility] -  Office  [phone]    Objective:   Vitals:   12/20/19 0300 12/20/19 0544 12/20/19 0803 12/20/19 0817  BP: (!) 145/66 (!) 169/79  (!) 155/68  Pulse: 81   80  Resp: 16   20  Temp: 98 F (36.7 C)   98.3 F (36.8 C)  TempSrc: Oral   Oral  SpO2: 94%  91% 95%  Weight:  127.1 kg    Height:        Wt Readings from Last 3 Encounters:  12/20/19 127.1 kg  04/23/17 127 kg  10/11/15 120.2 kg     Intake/Output Summary (Last 24 hours) at 12/20/2019 1009 Last data filed at 12/19/2019 1724 Gross per 24 hour  Intake 613 ml  Output 125 ml  Net 488 ml     Physical Exam Gen Exam:Alert awake-not in any distress HEENT:atraumatic, normocephalic Chest: B/L clear to auscultation anteriorly CVS:S1S2 regular Abdomen:soft non tender, non distended Extremities:no edema Neurology: Non focal Skin: no rash   Data Review:    CBC Recent Labs  Lab 12/19/19 1044 12/20/19 0309  WBC 4.1 4.8  HGB 13.4 14.1  HCT 44.1 44.1  PLT 119* 123*  MCV 90.9 88.9  MCH 27.6 28.4  MCHC 30.4 32.0  RDW 13.2 13.5  LYMPHSABS 0.4* 0.5*  MONOABS 0.5 0.4  EOSABS 0.0 0.0  BASOSABS 0.0 0.0    Chemistries  Recent Labs  Lab 12/19/19 0924 12/20/19 0309  NA 135 136  K 4.4 3.7  CL 102 104  CO2 19* 19*    GLUCOSE 134* 136*  BUN 17 18  CREATININE 1.66* 1.25*  CALCIUM 8.7* 8.8*  MG  --  1.9  AST 42* 23  ALT 21 21  ALKPHOS 51 49  BILITOT 1.6* 0.8   ------------------------------------------------------------------------------------------------------------------ Recent Labs    12/19/19 1930  CHOL 151  HDL 34*  LDLCALC 91  TRIG 628  CHOLHDL 4.4    Lab Results  Component Value Date   HGBA1C 5.3 12/19/2019   ------------------------------------------------------------------------------------------------------------------ Recent Labs    12/19/19 1637  TSH 2.406   ------------------------------------------------------------------------------------------------------------------ Recent Labs    12/19/19 1637 12/20/19 0309  FERRITIN 105 92    Coagulation profile No results for input(s): INR, PROTIME in the last 168 hours.  Recent Labs    12/19/19 1637 12/20/19 0309  DDIMER 0.69* 0.60*    Cardiac Enzymes No results for input(s): CKMB, TROPONINI, MYOGLOBIN in the last 168 hours.  Invalid input(s): CK ------------------------------------------------------------------------------------------------------------------ No results found for: BNP  Micro Results Recent Results (from the past 240 hour(s))  SARS Coronavirus 2 by RT PCR (hospital order, performed in [facility] hospital lab) Nasopharyngeal Nasopharyngeal Swab     Status: Abnormal   Collection Time: 12/19/19 10:28 AM   Specimen: Nasopharyngeal Swab  Result Value Ref Range Status   SARS Coronavirus 2  POSITIVE (A) NEGATIVE Final    Comment: RESULT CALLED TO, READ BACK BY AND VERIFIED WITH: B MICHELSON RN 1354 12/19/19 A BROWNING (NOTE) SARS-CoV-2 target nucleic acids are DETECTED  SARS-CoV-2 RNA is generally detectable in upper respiratory specimens  during the acute phase of infection.  Positive results are indicative  of the presence of the identified virus, but do not rule out bacterial infection or  co-infection with other pathogens not detected by the test.  Clinical correlation with patient history and  other diagnostic information is necessary to determine patient infection status.  The expected result is negative.  Fact Sheet for Patients:   BoilerBrush.com.cyhttps://www.fda.gov/media/136312/download   Fact Sheet for Healthcare Providers:   https://pope.com/https://www.fda.gov/media/136313/download    This test is not yet approved or cleared by the [place] FDA and  has been authorized for detection and/or diagnosis of SARS-CoV-2 by FDA under an Emergency Use Authorization (EUA).  This EUA will remain in effect (meaning thi s test can be used) for the duration of  the COVID-19 declaration under Section 564(b)(1) of the Act, 21 U.S.C. section 360-bbb-3(b)(1), unless the authorization is terminated or revoked sooner.  Performed at [facility], [address] [place], [place] [id number]     Radiology Reports CT Head Wo Contrast  Result Date: 12/19/2019 CLINICAL DATA:  Dizziness.  Fell. EXAM: CT HEAD WITHOUT CONTRAST CT CERVICAL SPINE WITHOUT CONTRAST TECHNIQUE: Multidetector CT imaging of the head and cervical spine was performed following the standard protocol without intravenous contrast. Multiplanar CT image reconstructions of the cervical spine were also generated. COMPARISON:  04/24/2017 FINDINGS: CT HEAD FINDINGS Brain: Stable mild age related cerebral atrophy, ventriculomegaly and periventricular white matter disease. No extra-axial fluid collections are identified. No CT findings for acute hemispheric infarction or intracranial hemorrhage. No mass lesions. The brainstem and cerebellum are normal. Vascular: Scattered atherosclerotic calcifications but no aneurysm or hyperdense vessels. Skull: No skull fracture or bone lesions. Sinuses/Orbits: Moderate pansinus mucoperiosteal thickening. The mastoid air cells and middle ear cavities are clear. The globes are intact. Other: No scalp lesions or  hematoma. CT CERVICAL SPINE FINDINGS Alignment: Normal Skull base and vertebrae: Moderate to advanced degenerative cervical spondylosis for age with multilevel disc disease and facet disease. No acute cervical spine fracture is identified. The facets are normally aligned. No facet or laminar fractures. Soft tissues and spinal canal: No prevertebral fluid or swelling. No visible canal hematoma. Disc levels: Fairly generous spinal canal. No large disc protrusions or significant canal stenosis. Mild to moderate bilateral foraminal narrowing due to uncinate spurring and facet disease. Upper chest: The lung apices are clear. Other: No neck mass or adenopathy. The thyroid gland is unremarkable. IMPRESSION: 1. Stable mild age related cerebral atrophy, ventriculomegaly and periventricular white matter disease. 2. No acute intracranial findings or skull fracture. 3. Moderate to advanced degenerative cervical spondylosis for age with multilevel disc disease and facet disease but no acute cervical spine fracture. Electronically Signed   By: Rudie MeyerP.  Gallerani M.D.   On: 12/19/2019 09:12   CT Cervical Spine Wo Contrast  Result Date: 12/19/2019 CLINICAL DATA:  Dizziness.  Fell. EXAM: CT HEAD WITHOUT CONTRAST CT CERVICAL SPINE WITHOUT CONTRAST TECHNIQUE: Multidetector CT imaging of the head and cervical spine was performed following the standard protocol without intravenous contrast. Multiplanar CT image reconstructions of the cervical spine were also generated. COMPARISON:  04/24/2017 FINDINGS: CT HEAD FINDINGS Brain: Stable mild age related cerebral atrophy, ventriculomegaly and periventricular white matter disease. No extra-axial fluid collections are identified.  No CT findings for acute hemispheric infarction or intracranial hemorrhage. No mass lesions. The brainstem and cerebellum are normal. Vascular: Scattered atherosclerotic calcifications but no aneurysm or hyperdense vessels. Skull: No skull fracture or bone lesions.  Sinuses/Orbits: Moderate pansinus mucoperiosteal thickening. The mastoid air cells and middle ear cavities are clear. The globes are intact. Other: No scalp lesions or hematoma. CT CERVICAL SPINE FINDINGS Alignment: Normal Skull base and vertebrae: Moderate to advanced degenerative cervical spondylosis for age with multilevel disc disease and facet disease. No acute cervical spine fracture is identified. The facets are normally aligned. No facet or laminar fractures. Soft tissues and spinal canal: No prevertebral fluid or swelling. No visible canal hematoma. Disc levels: Fairly generous spinal canal. No large disc protrusions or significant canal stenosis. Mild to moderate bilateral foraminal narrowing due to uncinate spurring and facet disease. Upper chest: The lung apices are clear. Other: No neck mass or adenopathy. The thyroid gland is unremarkable. IMPRESSION: 1. Stable mild age related cerebral atrophy, ventriculomegaly and periventricular white matter disease. 2. No acute intracranial findings or skull fracture. 3. Moderate to advanced degenerative cervical spondylosis for age with multilevel disc disease and facet disease but no acute cervical spine fracture. Electronically Signed   By: Rudie Meyer M.D.   On: 12/19/2019 09:12   MR ANGIO HEAD WO CONTRAST  Result Date: 12/19/2019 CLINICAL DATA:  Initial evaluation for acute syncope. EXAM: MRI HEAD WITHOUT CONTRAST MRA HEAD WITHOUT CONTRAST TECHNIQUE: Multiplanar, multiecho pulse sequences of the brain and surrounding structures were obtained without intravenous contrast. Angiographic images of the head were obtained using MRA technique without contrast. COMPARISON:  Prior head CT from earlier the same day. FINDINGS: MRI HEAD FINDINGS Brain: Age-related cerebral atrophy with mild chronic small vessel ischemic disease. No abnormal foci of restricted diffusion to suggest acute or subacute ischemia. Gray-white matter differentiation maintained. No  encephalomalacia to suggest chronic cortical infarction. No foci of susceptibility artifact to suggest acute or chronic intracranial hemorrhage. No mass lesion, midline shift or mass effect. No hydrocephalus or extra-axial fluid collection. Pituitary gland suprasellar region normal. Midline structures intact. Vascular: Major intracranial vascular flow voids are maintained. Skull and upper cervical spine: Craniocervical junction within normal limits. Bone marrow signal intensity normal. No scalp soft tissue abnormality. Sinuses/Orbits: Globes and orbital soft tissues within normal limits. Moderate mucosal thickening seen throughout the ethmoidal air cells and maxillary sinuses, likely allergic/inflammatory nature. No mastoid effusion. Inner ear structures grossly normal. Other: None. MRA HEAD FINDINGS ANTERIOR CIRCULATION: Visualized distal cervical segments of the internal carotid arteries are patent with symmetric antegrade flow. Scattered atheromatous irregularity within the carotid siphons without high-grade stenosis. ICA termini well perfused. Right A1 widely patent. Severe stenosis involving the proximal left A1 segment noted (series 5, image 117). Normal anterior communicating artery complex. Anterior cerebral arteries patent to their distal aspects without high-grade stenosis. No M1 stenosis or occlusion. Normal MCA bifurcations. Severe proximal left M2 stenosis, inferior division (series 1040, image 9). Distal MCA branches otherwise well perfused and symmetric. POSTERIOR CIRCULATION: Right vertebral artery dominant. Multifocal atheromatous irregularity throughout the right V4 segment with associated moderate multifocal stenoses. Right PICA patent. Left vertebral artery patent to the takeoff of the left PICA, but is largely occluded distally. Left PICA patent as well. Diffuse atheromatous irregularity throughout the basilar artery without high-grade stenosis. Superior cerebral arteries patent bilaterally.  Both PCAs primarily supplied via the basilar. Scattered atheromatous irregularity within the left greater than right PCAs without high-grade stenosis. No intracranial aneurysm. IMPRESSION: MRI  HEAD IMPRESSION: 1. No acute intracranial abnormality. 2. Age-related cerebral atrophy with mild chronic small vessel ischemic disease. 3. Moderate ethmoidal and maxillary sinusitis, likely allergic/inflammatory nature. MRA HEAD IMPRESSION: 1. Negative intracranial MRA for acute large vessel occlusion. 2. Moderate to advanced diffuse intracranial atherosclerotic change as above. Notable findings include moderate multifocal stenoses involving the right V4 segment, severe proximal left M2 stenosis, and severe proximal left A1 stenosis. The left vertebral artery is largely occluded beyond the takeoff of the left PICA. Electronically Signed   By: Rise Mu M.D.   On: 12/19/2019 23:27   MR BRAIN WO CONTRAST  Result Date: 12/19/2019 CLINICAL DATA:  Initial evaluation for acute syncope. EXAM: MRI HEAD WITHOUT CONTRAST MRA HEAD WITHOUT CONTRAST TECHNIQUE: Multiplanar, multiecho pulse sequences of the brain and surrounding structures were obtained without intravenous contrast. Angiographic images of the head were obtained using MRA technique without contrast. COMPARISON:  Prior head CT from earlier the same day. FINDINGS: MRI HEAD FINDINGS Brain: Age-related cerebral atrophy with mild chronic small vessel ischemic disease. No abnormal foci of restricted diffusion to suggest acute or subacute ischemia. Gray-white matter differentiation maintained. No encephalomalacia to suggest chronic cortical infarction. No foci of susceptibility artifact to suggest acute or chronic intracranial hemorrhage. No mass lesion, midline shift or mass effect. No hydrocephalus or extra-axial fluid collection. Pituitary gland suprasellar region normal. Midline structures intact. Vascular: Major intracranial vascular flow voids are maintained.  Skull and upper cervical spine: Craniocervical junction within normal limits. Bone marrow signal intensity normal. No scalp soft tissue abnormality. Sinuses/Orbits: Globes and orbital soft tissues within normal limits. Moderate mucosal thickening seen throughout the ethmoidal air cells and maxillary sinuses, likely allergic/inflammatory nature. No mastoid effusion. Inner ear structures grossly normal. Other: None. MRA HEAD FINDINGS ANTERIOR CIRCULATION: Visualized distal cervical segments of the internal carotid arteries are patent with symmetric antegrade flow. Scattered atheromatous irregularity within the carotid siphons without high-grade stenosis. ICA termini well perfused. Right A1 widely patent. Severe stenosis involving the proximal left A1 segment noted (series 5, image 117). Normal anterior communicating artery complex. Anterior cerebral arteries patent to their distal aspects without high-grade stenosis. No M1 stenosis or occlusion. Normal MCA bifurcations. Severe proximal left M2 stenosis, inferior division (series 1040, image 9). Distal MCA branches otherwise well perfused and symmetric. POSTERIOR CIRCULATION: Right vertebral artery dominant. Multifocal atheromatous irregularity throughout the right V4 segment with associated moderate multifocal stenoses. Right PICA patent. Left vertebral artery patent to the takeoff of the left PICA, but is largely occluded distally. Left PICA patent as well. Diffuse atheromatous irregularity throughout the basilar artery without high-grade stenosis. Superior cerebral arteries patent bilaterally. Both PCAs primarily supplied via the basilar. Scattered atheromatous irregularity within the left greater than right PCAs without high-grade stenosis. No intracranial aneurysm. IMPRESSION: MRI HEAD IMPRESSION: 1. No acute intracranial abnormality. 2. Age-related cerebral atrophy with mild chronic small vessel ischemic disease. 3. Moderate ethmoidal and maxillary sinusitis,  likely allergic/inflammatory nature. MRA HEAD IMPRESSION: 1. Negative intracranial MRA for acute large vessel occlusion. 2. Moderate to advanced diffuse intracranial atherosclerotic change as above. Notable findings include moderate multifocal stenoses involving the right V4 segment, severe proximal left M2 stenosis, and severe proximal left A1 stenosis. The left vertebral artery is largely occluded beyond the takeoff of the left PICA. Electronically Signed   By: Rise Mu M.D.   On: 12/19/2019 23:27   DG Chest Port 1 View  Result Date: 12/20/2019 CLINICAL DATA:  Shortness of breath.  Body pain. EXAM: PORTABLE CHEST 1  VIEW COMPARISON:  Yesterday FINDINGS: Mild elevation of the right diaphragm with mild overlying atelectasis. The left lung is clear. Normal heart size. Vascular pedicle widening. No visible effusion or pneumothorax. IMPRESSION: 1. Stable compared to yesterday. 2. Elevated right diaphragm with mild scarring or atelectasis. Electronically Signed   By: Marnee Spring M.D.   On: 12/20/2019 08:50   DG CHEST PORT 1 VIEW  Result Date: 12/19/2019 CLINICAL DATA:  COVID-19 positive, diaphoresis, syncope EXAM: PORTABLE CHEST 1 VIEW COMPARISON:  07/20/2015 FINDINGS: Single frontal view of the chest demonstrates a stable cardiac silhouette. Linear consolidation at the right lung base likely reflects scarring or subsegmental atelectasis. No airspace disease, effusion, or pneumothorax. No acute bony abnormalities. IMPRESSION: 1. No acute intrathoracic process. Electronically Signed   By: Sharlet Salina M.D.   On: 12/19/2019 19:00   ECHOCARDIOGRAM LIMITED  Result Date: 12/19/2019    ECHOCARDIOGRAM REPORT   Patient Name:   ISHMEAL RORIE Date of Exam: 12/19/2019 Medical Rec #:  [id number]         Height:       76.0 in Accession #:    [id number]        Weight:       285.0 lb Date of Birth:  January 02, 1951          BSA:          2.575 m Patient Age:    69 years          BP:           202/86 mmHg  Patient Gender: M                 HR:           68 bpm. Exam Location:  Inpatient Procedure: Limited Echo, Intracardiac Opacification Agent, Cardiac Doppler and            Color Doppler Indications:    Syncope  History:        Patient has prior history of Echocardiogram examinations, most                 recent 07/21/2015. Risk Factors:Hypertension, Dyslipidemia and                 morbid obesity. Covid +.  Sonographer:    Lavenia Atlas Referring Phys: [id number] JENNIFER YATES  Sonographer Comments: Patient is morbidly obese. Image acquisition challenging due to patient body habitus. IMPRESSIONS  1. Left ventricular ejection fraction, by estimation, is 60 to 65%. The left ventricle has normal function. The left ventricle has no regional wall motion abnormalities. There is moderate concentric left ventricular hypertrophy. Left ventricular diastolic parameters are consistent with Grade I diastolic dysfunction (impaired relaxation). Elevated left ventricular end-diastolic pressure.  2. Right ventricular systolic function is normal. The right ventricular size is normal. Tricuspid regurgitation signal is inadequate for assessing PA pressure.  3. The mitral valve is normal in structure. Mild mitral valve regurgitation. No evidence of mitral stenosis.  4. The aortic valve is normal in structure. Aortic valve regurgitation is not visualized. No aortic stenosis is present.  5. The inferior vena cava is normal in size with greater than 50% respiratory variability, suggesting right atrial pressure of 3 mmHg. FINDINGS  Left Ventricle: Left ventricular ejection fraction, by estimation, is 60 to 65%. The left ventricle has normal function. The left ventricle has no regional wall motion abnormalities. Definity contrast agent was given IV to delineate the left ventricular  endocardial borders. The left ventricular internal cavity  size was normal in size. There is moderate concentric left ventricular hypertrophy. Left ventricular  diastolic parameters are consistent with Grade I diastolic dysfunction (impaired relaxation). Elevated left ventricular end-diastolic pressure. Right Ventricle: The right ventricular size is normal. No increase in right ventricular wall thickness. Right ventricular systolic function is normal. Tricuspid regurgitation signal is inadequate for assessing PA pressure. Left Atrium: Left atrial size was normal in size. Right Atrium: Right atrial size was normal in size. Pericardium: There is no evidence of pericardial effusion. Mitral Valve: The mitral valve is normal in structure. Mild to moderate mitral annular calcification. Mild mitral valve regurgitation. No evidence of mitral valve stenosis. Tricuspid Valve: The tricuspid valve is normal in structure. Tricuspid valve regurgitation is not demonstrated. No evidence of tricuspid stenosis. Aortic Valve: The aortic valve is normal in structure. Aortic valve regurgitation is not visualized. No aortic stenosis is present. Pulmonic Valve: The pulmonic valve was normal in structure. Pulmonic valve regurgitation is not visualized. No evidence of pulmonic stenosis. Aorta: The aortic root is normal in size and structure. Venous: The inferior vena cava is normal in size with greater than 50% respiratory variability, suggesting right atrial pressure of 3 mmHg. IAS/Shunts: No atrial level shunt detected by color flow Doppler.  LEFT VENTRICLE PLAX 2D LVIDd:         5.50 cm  Diastology LVIDs:         3.70 cm  LV e' medial:    4.46 cm/s LV PW:         1.40 cm  LV E/e' medial:  17.6 LV IVS:        1.40 cm  LV e' lateral:   5.44 cm/s LVOT diam:     2.10 cm  LV E/e' lateral: 14.4 LVOT Area:     3.46 cm  LEFT ATRIUM         Index LA diam:    3.60 cm 1.40 cm/m   AORTA Ao Root diam: 3.20 cm MITRAL VALVE MV Area (PHT): 2.99 cm     SHUNTS MV Decel Time: 254 msec     Systemic Diam: 2.10 cm MV E velocity: 78.40 cm/s MV A velocity: 121.00 cm/s MV E/A ratio:  0.65 Armanda Magic MD  Electronically signed by Armanda Magic MD Signature Date/Time: 12/19/2019/6:33:05 PM    Final

## 2019-12-21 ENCOUNTER — Inpatient Hospital Stay (HOSPITAL_COMMUNITY): Payer: Medicare HMO

## 2019-12-21 ENCOUNTER — Other Ambulatory Visit: Payer: Self-pay

## 2019-12-21 ENCOUNTER — Encounter (HOSPITAL_COMMUNITY): Payer: Self-pay | Admitting: Internal Medicine

## 2019-12-21 DIAGNOSIS — R55 Syncope and collapse: Secondary | ICD-10-CM

## 2019-12-21 LAB — COMPREHENSIVE METABOLIC PANEL
ALT: 18 U/L (ref 0–44)
AST: 22 U/L (ref 15–41)
Albumin: 3.4 g/dL — ABNORMAL LOW (ref 3.5–5.0)
Alkaline Phosphatase: 48 U/L (ref 38–126)
Anion gap: 12 (ref 5–15)
BUN: 20 mg/dL (ref 8–23)
CO2: 20 mmol/L — ABNORMAL LOW (ref 22–32)
Calcium: 9.1 mg/dL (ref 8.9–10.3)
Chloride: 104 mmol/L (ref 98–111)
Creatinine, Ser: 1.16 mg/dL (ref 0.61–1.24)
GFR calc Af Amer: >60 mL/min (ref >60)
GFR calc non Af Amer: >60 mL/min (ref >60)
Glucose, Bld: 118 mg/dL — ABNORMAL HIGH (ref 70–99)
Potassium: 4 mmol/L (ref 3.5–5.1)
Sodium: 136 mmol/L (ref 135–145)
Total Bilirubin: 0.4 mg/dL (ref 0.3–1.2)
Total Protein: 7.9 g/dL (ref 6.5–8.1)

## 2019-12-21 LAB — C-REACTIVE PROTEIN: CRP: 11.5 mg/dL — ABNORMAL HIGH (ref <1.0)

## 2019-12-21 LAB — FERRITIN: Ferritin: 113 ng/mL (ref 24–336)

## 2019-12-21 LAB — CBC WITH DIFFERENTIAL/PLATELET
Abs Immature Granulocytes: 0.03 10*3/uL (ref 0.00–0.07)
Basophils Absolute: 0 10*3/uL (ref 0.0–0.1)
Basophils Relative: 0 %
Eosinophils Absolute: 0 10*3/uL (ref 0.0–0.5)
Eosinophils Relative: 0 %
HCT: 47.3 % (ref 39.0–52.0)
Hemoglobin: 14.9 g/dL (ref 13.0–17.0)
Immature Granulocytes: 1 %
Lymphocytes Relative: 19 %
Lymphs Abs: 0.6 10*3/uL — ABNORMAL LOW (ref 0.7–4.0)
MCH: 28.3 pg (ref 26.0–34.0)
MCHC: 31.5 g/dL (ref 30.0–36.0)
MCV: 89.8 fL (ref 80.0–100.0)
Monocytes Absolute: 0.4 10*3/uL (ref 0.1–1.0)
Monocytes Relative: 13 %
Neutro Abs: 2.2 10*3/uL (ref 1.7–7.7)
Neutrophils Relative %: 67 %
Platelets: 145 10*3/uL — ABNORMAL LOW (ref 150–400)
RBC: 5.27 MIL/uL (ref 4.22–5.81)
RDW: 13.7 % (ref 11.5–15.5)
WBC: 3.3 10*3/uL — ABNORMAL LOW (ref 4.0–10.5)
nRBC: 0 % (ref 0.0–0.2)

## 2019-12-21 LAB — GLUCOSE, CAPILLARY
Glucose-Capillary: 129 mg/dL — ABNORMAL HIGH (ref 70–99)
Glucose-Capillary: 110 mg/dL — ABNORMAL HIGH (ref 70–99)
Glucose-Capillary: 100 mg/dL — ABNORMAL HIGH (ref 70–99)
Glucose-Capillary: 133 mg/dL — ABNORMAL HIGH (ref 70–99)

## 2019-12-21 LAB — D-DIMER, QUANTITATIVE: D-Dimer, Quant: 0.93 ug/mL-FEU — ABNORMAL HIGH (ref 0.00–0.50)

## 2019-12-21 LAB — PHOSPHORUS: Phosphorus: 2.8 mg/dL (ref 2.5–4.6)

## 2019-12-21 LAB — MAGNESIUM: Magnesium: 2.1 mg/dL (ref 1.7–2.4)

## 2019-12-21 MED ORDER — ENSURE ENLIVE PO LIQD
237.0000 mL | Freq: Two times a day (BID) | ORAL | Status: DC
  Administered 2019-12-21: 237 mL via ORAL

## 2019-12-21 NOTE — Progress Notes (Addendum)
Pt states that he feels sick after taking hydralazine 100mg . After taking 50 mg (2 25mg  tab) pt began to dry heave, cough, and had a small emesis. Pt states that he could not take this medication any more as it makes him sick. Pt denies SOB, itching, or feelings of throat swelling.  Pt has been experiencing nausea and and poor appetite since admission.   Notified MD on call. Will continue to monitor.

## 2019-12-21 NOTE — Progress Notes (Signed)
Physical Therapy Treatment Patient Details Name: Alexander Gonzalez MRN: [id number] DOB: Oct 23, 1950 Today's Date: 12/21/2019    History of Present Illness Pt is 69 yo male presenting with syncopal episode and found to be COVID +. MRI of head negative. CT of neck negative.  Carotid U/S : pending. PMH including CVA, HTN, and HLD.    PT Comments    Pt demonstrating some progress today ; however, remained limited due to head/neck pain and ortho static hypotension.  He required only min A for transfers but frequent cues for safety.  HR and O2 were stable.  Pt had c/o severe head/neck pain when OOB - with crying, grimacing, and restlessness with difficulty focusing on anything else.  Some relief in supine.  Also, c/o dizziness when sitting upright and standing - did have decrease in BP with transfers but symptoms seem to resolve with time but again pt distracted by pain and difficulty describing.  Did update POC to home with HHPT and supervision as pt moving better today and expected to progress well as pain and orthostatic hypotension improves.     Follow Up Recommendations  Home health PT;Supervision/Assistance - 24 hour     Equipment Recommendations  Rolling walker with 5" wheels;3in1 (PT);Other (comment)    Recommendations for Other Services       Precautions / Restrictions Precautions Precautions: Fall;Other (comment) Precaution Comments: Limited by pain    Mobility  Bed Mobility Overal bed mobility: Needs Assistance Bed Mobility: Rolling;Sidelying to Sit;Sit to Sidelying Rolling: Min assist Sidelying to sit: Min assist     Sit to sidelying: Min assist General bed mobility comments: Min A for steadying  Transfers Overall transfer level: Needs assistance   Transfers: Sit to/from [employer] Transfers Sit to Stand: Min assist Stand pivot transfers: Min assist       General transfer comment: Performed sit to stand x 2; stand pivot to chair and back to bed; cues for  safe hand placement and use of RW; assist of 2 for safety  Ambulation/Gait Ambulation/Gait assistance: Min assist Gait Distance (Feet): 1 Feet Assistive device: Rolling walker (2 wheeled) Gait Pattern/deviations: Step-to pattern;Decreased stride length;Shuffle Gait velocity: decreased   General Gait Details: steps back to bed with RW; limited by pain   Stairs             Wheelchair Mobility    Modified Rankin (Stroke Patients Only)       Balance Overall balance assessment: Needs assistance Sitting-balance support: Feet supported;No upper extremity supported Sitting balance-Leahy Scale: Fair Sitting balance - Comments: Able to sit without UE support but rocks back and forth due to pain     Standing balance-Leahy Scale: Poor Standing balance comment: requring min A to steady; limited standing due to pain                            Cognition Arousal/Alertness: Awake/alert Behavior During Therapy: Anxious Overall Cognitive Status: No family/caregiver present to determine baseline cognitive functioning                                 General Comments: Cognition difficult to assess as pt anxious and focused on pain - pt had difficulty describing if dizzy vs syncopal at EOB states "I can't get my faculities straight."      Exercises      General Comments General comments (skin integrity, edema, etc.):  Pt immediately c/o pain in head/neck/ shoulder upon sitting. Pt grimacing, rocking, and crying due to pain.  Agreed to transfer to chair but once in chair frequently shifting positions due to pain and had to return to bed.   Pt had some c/o dizziness when sitting upright and standing but unable to describe dizziness - more focused on pain.  BP in supine 128/77, sitting 98/69 dizzy, but improved with time, pivoted to chair and minimally reclined 159/84, stated had to get back to bed and was dizzy when sat upright in chair 120/94, stood and steps  back to bed, attempted to get bp in standing but unable due to unable to tolerate, immediately upon return to supine 110/97 , suspect was lower in standing. Notified RN of mobility and orthostatic hypotension.      Pertinent Vitals/Pain Pain Assessment: Faces Faces Pain Scale: Hurts whole lot Pain Location: back of head and neck - increased with OOB Pain Descriptors / Indicators: Crying;Grimacing;Moaning Pain Intervention(s): Limited activity within patient's tolerance;Monitored during session;Premedicated before session;Repositioned;Relaxation;Heat applied    Home Living                      Prior Function            PT Goals (current goals can now be found in the care plan section) Acute Rehab PT Goals Patient Stated Goal: Stop pain PT Goal Formulation: With patient Time For Goal Achievement: 01/03/20 Potential to Achieve Goals: Good Progress towards PT goals: Progressing toward goals    Frequency    Min 3X/week      PT Plan Discharge plan needs to be updated    Co-evaluation              AM-PAC PT "6 Clicks" Mobility   Outcome Measure  Help needed turning from your back to your side while in a flat bed without using bedrails?: A Little Help needed moving from lying on your back to sitting on the side of a flat bed without using bedrails?: A Little Help needed moving to and from a bed to a chair (including a wheelchair)?: A Little Help needed standing up from a chair using your arms (e.g., wheelchair or bedside chair)?: A Little Help needed to walk in hospital room?: A Lot Help needed climbing 3-5 steps with a railing? : A Lot 6 Click Score: 16    End of Session Equipment Utilized During Treatment: Gait belt Activity Tolerance: Patient limited by pain Patient left: in bed;with call bell/phone within reach;with bed alarm set Nurse Communication: Mobility status (BP and pain) PT Visit Diagnosis: Pain;Other abnormalities of gait and mobility  (R26.89)     Time: 5400-8676 PT Time Calculation (min) (ACUTE ONLY): 24 min  Charges:  $Therapeutic Activity: 23-37 mins                     Anise Salvo, PT Acute Rehab Services Pager [phone] [facility] [phone]     Rayetta Humphrey 12/21/2019, 12:40 PM

## 2019-12-21 NOTE — Progress Notes (Signed)
PROGRESS NOTE                                                                                                                                                                                                             Patient Demographics:    Alexander Gonzalez, is a 69 y.o. male, DOB - 04/12/1950, ZOX:[id number]  Outpatient Primary MD for the patient is Renford DillsPolite, Ronald, MD   Admit date - 12/19/2019   LOS - 1  Chief Complaint  Patient presents with  . Fall  . Head Injury  . Dizziness       Brief Narrative: Patient is a 69 y.o. male with PMHx of CVA, HTN, HLD-who has been feeling very fatigued/tired, feverish, myalgias for the past 3-4 days-this past Friday-while making coffee-patient felt more weak-and while attempting to walk to a chair he apparently passed out.  Throughout the weekend-he continued to have myalgias and fever and weakness-he gradually developed some shortness of breath-he subsequently presented to the emergency room-where he was found to have COVID-19, mild AKI on CKD stage IIIa-and subsequently admitted to the hospitalist service.  See below for further details.  COVID-19 vaccinated status: Vaccinated-around May-J&J vaccine.  Significant Events: 9/20>> presented to [facility] ED-syncope x3 days prior to presentation-worsening weakness, fever/myalgias/exertional shortness of breath.  Significant studies: 9/20>> CT head: No acute intracranial findings/skull fracture. 9/20>> CT C-spine: No acute cervical spine fracture-moderate to advanced degenerative cervical spondylosis 9/20>> MRI brain: No acute intracranial abnormality 9/20>> MRI brain: Negative intracranial MRA for acute large vessel occlusion, diffuse intracranial atherosclerotic change 9/20>> Echo: EF 60-65%, grade 1 diastolic dysfunction 9/20>> chest x-ray: No acute intrathoracic process. 9/21>> chest x-ray: No obvious pneumonia. 9/21>> carotid  ultrasound: Pending  COVID-19 medications: Casirivimab-imdevimab: 9/20 x1   Antibiotics: None  Microbiology data: None  Procedures: None  Consults: None  DVT prophylaxis: Prophylactic Lovenox   Subjective:    Alexander MandrilEdward Tax today, continues to complain of generalized body ache, fatigue, poor appetite, he does report some nausea this morning.  .   Assessment  & Plan :   Syncope:  - Probably related to vasovagal syncope in the setting of acute infection. - Telemetry unremarkable - Echo with preserved EF.  COVID-19 infection:  -Patient is vaccinated . -No evidence of pneumonia on repeat chest x-ray yesterday, he still reports thickened fatigue, generalized weakness ache. -He  ambulated with PT today, no hypoxia. -Need to trend inflammatory markers,. -He was encouraged again today to use incentive spirometry, flutter valve, and to get out of bed to chair  COVID-19 Labs  Recent Labs    12/19/19 1637 12/20/19 0309 12/21/19 0935  DDIMER 0.69* 0.60* 0.93*  FERRITIN 105 92 113  LDH 131  --   --   CRP 12.3* 12.3* 11.5*    Lab Results  Component Value Date   SARSCOV2NAA POSITIVE (A) 12/19/2019    Mild AKI on CKD stage IIIa:  -Resolved   HTN: -Continue with home medication including amlodipine and losartan  HLD: Continue statin  History of CVA-no reported history of known carotid stenosis: Reviewed carotid angiogram done in 2017-await carotid ultrasound.  On aspirin/Plavix/statin.  Obesity: Estimated body mass index is 34.11 kg/m as calculated from the following:   Height as of this encounter: 6\' 4"  (1.93 m).   Weight as of this encounter: 127.1 kg.      ABG:    Component Value Date/Time   TCO2 22 07/19/2015 1122    Vent Settings: N/A  Condition - Stable  Family Communication  : Discussed with wife today.  Addendum 9/21 : Wife called back-apparently patient has been hesitant to proceed with a carotid endarterectomy in the past-due to fear of  general anesthesia-he has lost follow-up with neurology.  Explained to her that we need to watch him closely for worsening Covid symptoms-and that carotid endarterectomy/vascular surgery evaluation could be done when he is a bit more stable.  Code Status :  Full Code  Diet :  Diet Order            Diet Heart Room service appropriate? Yes; Fluid consistency: Thin  Diet effective now                  Disposition Plan  :   Status is: Observation  The patient will require care spanning > 2 midnights and should be moved to inpatient because: Ongoing diagnostic testing needed not appropriate for outpatient work up and Inpatient level of care appropriate due to severity of illness  Dispo: The patient is from: Home              Anticipated d/c is to: Home              Anticipated d/c date is: 2 days              Patient currently is not medically stable to d/c.   Barriers to discharge: Hypoxia requiring O2 supplementation/complete 5 days of IV Remdesivir  Antimicorbials  :    Anti-infectives (From admission, onward)   None      Inpatient Medications  Scheduled Meds: . amLODipine  10 mg Oral Daily  . aspirin EC  81 mg Oral Daily  . clopidogrel  75 mg Oral Daily  . enoxaparin (LOVENOX) injection  60 mg Subcutaneous Q24H  . hydrALAZINE  100 mg Oral Q6H  . insulin aspart  0-15 Units Subcutaneous TID WC  . insulin aspart  0-5 Units Subcutaneous QHS  . simvastatin  20 mg Oral QHS  . sodium chloride flush  3 mL Intravenous Q12H   Continuous Infusions: . sodium chloride    . famotidine (PEPCID) IV     PRN Meds:.sodium chloride, acetaminophen **OR** acetaminophen, albuterol, cyclobenzaprine, diphenhydrAMINE, EPINEPHrine, famotidine (PEPCID) IV, HYDROcodone-acetaminophen, methylPREDNISolone (SOLU-MEDROL) injection, morphine injection, ondansetron **OR** ondansetron (ZOFRAN) IV   Time Spent in minutes  25  See all Orders  from today for further details   Huey Bienenstock M.D on  12/21/2019 at 3:21 PM  To page go to [email or web address] - use universal password  [facility] -  Office  [phone]    Objective:   Vitals:   12/21/19 0402 12/21/19 0605 12/21/19 0800 12/21/19 1229  BP: (!) 155/73 (!) 155/67  (!) 110/97  Pulse: 83 83  79  Resp:  Temp: 98.5 F (36.9 C)   98 F (36.7 C)  TempSrc: Oral   Oral  SpO2: 96%  98% 97%  Weight:      Height:        Wt Readings from Last 3 Encounters:  12/20/19 127.1 kg  04/23/17 127 kg  10/11/15 120.2 kg     Intake/Output Summary (Last 24 hours) at 12/21/2019 1521 Last data filed at 12/21/2019 1300 Gross per 24 hour  Intake 60 ml  Output 1075 ml  Net -1015 ml     Physical Exam  Awake Alert, Oriented X 3, No new F.N deficits, Normal affect Symmetrical Chest wall movement, Good air movement bilaterally, CTAB RRR,No Gallops,Rubs or new Murmurs, No Parasternal Heave +ve B.Sounds, Abd Soft, No tenderness, No rebound - guarding or rigidity. No Cyanosis, Clubbing or edema, No new Rash or bruise      Data Review:    CBC Recent Labs  Lab 12/19/19 1044 12/20/19 0309 12/21/19 0935  WBC 4.1 4.8 3.3*  HGB 13.4 14.1 14.9  HCT 44.1 44.1 47.3  PLT 119* 123* 145*  MCV 90.9 88.9 89.8  MCH 27.6 28.4 28.3  MCHC 30.4 32.0 31.5  RDW 13.2 13.5 13.7  LYMPHSABS 0.4* 0.5* 0.6*  MONOABS 0.5 0.4 0.4  EOSABS 0.0 0.0 0.0  BASOSABS 0.0 0.0 0.0    Chemistries  Recent Labs  Lab 12/19/19 0924 12/20/19 0309 12/21/19 0935  NA 135 136 136  K 4.4 3.7 4.0  CL 102 104 104  CO2 19* 19* 20*  GLUCOSE 134* 136* 118*  BUN CREATININE 1.66* 1.25* 1.16  CALCIUM 8.7* 8.8* 9.1  MG  --  1.9 2.1  AST 42* 23 22  ALT ALKPHOS 51 49 48  BILITOT 1.6* 0.8 0.4   ------------------------------------------------------------------------------------------------------------------ Recent Labs    12/19/19 1930  CHOL 151  HDL 34*  LDLCALC 91  TRIG 098  CHOLHDL 4.4    Lab Results  Component  Value Date   HGBA1C 5.3 12/19/2019   ------------------------------------------------------------------------------------------------------------------ Recent Labs    12/19/19 1637  TSH 2.406   ------------------------------------------------------------------------------------------------------------------ Recent Labs    12/20/19 0309 12/21/19 0935  FERRITIN 92 113    Coagulation profile No results for input(s): INR, PROTIME in the last 168 hours.  Recent Labs    12/20/19 0309 12/21/19 0935  DDIMER 0.60* 0.93*    Cardiac Enzymes No results for input(s): CKMB, TROPONINI, MYOGLOBIN in the last 168 hours.  Invalid input(s): CK ------------------------------------------------------------------------------------------------------------------ No results found for: BNP  Micro Results Recent Results (from the past 240 hour(s))  SARS Coronavirus 2 by RT PCR (hospital order, performed in [facility] hospital lab) Nasopharyngeal Nasopharyngeal Swab     Status: Abnormal   Collection Time: 12/19/19 10:28 AM   Specimen: Nasopharyngeal Swab  Result Value Ref Range Status   SARS Coronavirus 2 POSITIVE (A) NEGATIVE Final    Comment: RESULT CALLED TO, READ BACK BY AND VERIFIED WITH: B MICHELSON RN 1354 12/19/19 A BROWNING (NOTE) SARS-CoV-2 target nucleic acids are DETECTED  SARS-CoV-2 RNA  is generally detectable in upper respiratory specimens  during the acute phase of infection.  Positive results are indicative  of the presence of the identified virus, but do not rule out bacterial infection or co-infection with other pathogens not detected by the test.  Clinical correlation with patient history and  other diagnostic information is necessary to determine patient infection status.  The expected result is negative.  Fact Sheet for Patients:   BoilerBrush.com.cy   Fact Sheet for Healthcare Providers:   https://pope.com/    This  test is not yet approved or cleared by the [place] FDA and  has been authorized for detection and/or diagnosis of SARS-CoV-2 by FDA under an Emergency Use Authorization (EUA).  This EUA will remain in effect (meaning thi s test can be used) for the duration of  the COVID-19 declaration under Section 564(b)(1) of the Act, 21 U.S.C. section 360-bbb-3(b)(1), unless the authorization is terminated or revoked sooner.  Performed at [facility], [address] [place], [place] [address]     Radiology Reports CT Head Wo Contrast  Result Date: 12/19/2019 CLINICAL DATA:  Dizziness.  Fell. EXAM: CT HEAD WITHOUT CONTRAST CT CERVICAL SPINE WITHOUT CONTRAST TECHNIQUE: Multidetector CT imaging of the head and cervical spine was performed following the standard protocol without intravenous contrast. Multiplanar CT image reconstructions of the cervical spine were also generated. COMPARISON:  04/24/2017 FINDINGS: CT HEAD FINDINGS Brain: Stable mild age related cerebral atrophy, ventriculomegaly and periventricular white matter disease. No extra-axial fluid collections are identified. No CT findings for acute hemispheric infarction or intracranial hemorrhage. No mass lesions. The brainstem and cerebellum are normal. Vascular: Scattered atherosclerotic calcifications but no aneurysm or hyperdense vessels. Skull: No skull fracture or bone lesions. Sinuses/Orbits: Moderate pansinus mucoperiosteal thickening. The mastoid air cells and middle ear cavities are clear. The globes are intact. Other: No scalp lesions or hematoma. CT CERVICAL SPINE FINDINGS Alignment: Normal Skull base and vertebrae: Moderate to advanced degenerative cervical spondylosis for age with multilevel disc disease and facet disease. No acute cervical spine fracture is identified. The facets are normally aligned. No facet or laminar fractures. Soft tissues and spinal canal: No prevertebral fluid or swelling. No visible canal hematoma. Disc  levels: Fairly generous spinal canal. No large disc protrusions or significant canal stenosis. Mild to moderate bilateral foraminal narrowing due to uncinate spurring and facet disease. Upper chest: The lung apices are clear. Other: No neck mass or adenopathy. The thyroid gland is unremarkable. IMPRESSION: 1. Stable mild age related cerebral atrophy, ventriculomegaly and periventricular white matter disease. 2. No acute intracranial findings or skull fracture. 3. Moderate to advanced degenerative cervical spondylosis for age with multilevel disc disease and facet disease but no acute cervical spine fracture. Electronically Signed   By: Rudie Meyer M.D.   On: 12/19/2019 09:12   CT Cervical Spine Wo Contrast  Result Date: 12/19/2019 CLINICAL DATA:  Dizziness.  Fell. EXAM: CT HEAD WITHOUT CONTRAST CT CERVICAL SPINE WITHOUT CONTRAST TECHNIQUE: Multidetector CT imaging of the head and cervical spine was performed following the standard protocol without intravenous contrast. Multiplanar CT image reconstructions of the cervical spine were also generated. COMPARISON:  04/24/2017 FINDINGS: CT HEAD FINDINGS Brain: Stable mild age related cerebral atrophy, ventriculomegaly and periventricular white matter disease. No extra-axial fluid collections are identified. No CT findings for acute hemispheric infarction or intracranial hemorrhage. No mass lesions. The brainstem and cerebellum are normal. Vascular: Scattered atherosclerotic calcifications but no aneurysm or hyperdense vessels. Skull: No skull fracture or  bone lesions. Sinuses/Orbits: Moderate pansinus mucoperiosteal thickening. The mastoid air cells and middle ear cavities are clear. The globes are intact. Other: No scalp lesions or hematoma. CT CERVICAL SPINE FINDINGS Alignment: Normal Skull base and vertebrae: Moderate to advanced degenerative cervical spondylosis for age with multilevel disc disease and facet disease. No acute cervical spine fracture is  identified. The facets are normally aligned. No facet or laminar fractures. Soft tissues and spinal canal: No prevertebral fluid or swelling. No visible canal hematoma. Disc levels: Fairly generous spinal canal. No large disc protrusions or significant canal stenosis. Mild to moderate bilateral foraminal narrowing due to uncinate spurring and facet disease. Upper chest: The lung apices are clear. Other: No neck mass or adenopathy. The thyroid gland is unremarkable. IMPRESSION: 1. Stable mild age related cerebral atrophy, ventriculomegaly and periventricular white matter disease. 2. No acute intracranial findings or skull fracture. 3. Moderate to advanced degenerative cervical spondylosis for age with multilevel disc disease and facet disease but no acute cervical spine fracture. Electronically Signed   By: Rudie Meyer M.D.   On: 12/19/2019 09:12   MR ANGIO HEAD WO CONTRAST  Result Date: 12/19/2019 CLINICAL DATA:  Initial evaluation for acute syncope. EXAM: MRI HEAD WITHOUT CONTRAST MRA HEAD WITHOUT CONTRAST TECHNIQUE: Multiplanar, multiecho pulse sequences of the brain and surrounding structures were obtained without intravenous contrast. Angiographic images of the head were obtained using MRA technique without contrast. COMPARISON:  Prior head CT from earlier the same day. FINDINGS: MRI HEAD FINDINGS Brain: Age-related cerebral atrophy with mild chronic small vessel ischemic disease. No abnormal foci of restricted diffusion to suggest acute or subacute ischemia. Gray-white matter differentiation maintained. No encephalomalacia to suggest chronic cortical infarction. No foci of susceptibility artifact to suggest acute or chronic intracranial hemorrhage. No mass lesion, midline shift or mass effect. No hydrocephalus or extra-axial fluid collection. Pituitary gland suprasellar region normal. Midline structures intact. Vascular: Major intracranial vascular flow voids are maintained. Skull and upper cervical  spine: Craniocervical junction within normal limits. Bone marrow signal intensity normal. No scalp soft tissue abnormality. Sinuses/Orbits: Globes and orbital soft tissues within normal limits. Moderate mucosal thickening seen throughout the ethmoidal air cells and maxillary sinuses, likely allergic/inflammatory nature. No mastoid effusion. Inner ear structures grossly normal. Other: None. MRA HEAD FINDINGS ANTERIOR CIRCULATION: Visualized distal cervical segments of the internal carotid arteries are patent with symmetric antegrade flow. Scattered atheromatous irregularity within the carotid siphons without high-grade stenosis. ICA termini well perfused. Right A1 widely patent. Severe stenosis involving the proximal left A1 segment noted (series 5, image 117). Normal anterior communicating artery complex. Anterior cerebral arteries patent to their distal aspects without high-grade stenosis. No M1 stenosis or occlusion. Normal MCA bifurcations. Severe proximal left M2 stenosis, inferior division (series 1040, image 9). Distal MCA branches otherwise well perfused and symmetric. POSTERIOR CIRCULATION: Right vertebral artery dominant. Multifocal atheromatous irregularity throughout the right V4 segment with associated moderate multifocal stenoses. Right PICA patent. Left vertebral artery patent to the takeoff of the left PICA, but is largely occluded distally. Left PICA patent as well. Diffuse atheromatous irregularity throughout the basilar artery without high-grade stenosis. Superior cerebral arteries patent bilaterally. Both PCAs primarily supplied via the basilar. Scattered atheromatous irregularity within the left greater than right PCAs without high-grade stenosis. No intracranial aneurysm. IMPRESSION: MRI HEAD IMPRESSION: 1. No acute intracranial abnormality. 2. Age-related cerebral atrophy with mild chronic small vessel ischemic disease. 3. Moderate ethmoidal and maxillary sinusitis, likely allergic/inflammatory  nature. MRA HEAD IMPRESSION: 1. Negative intracranial MRA  for acute large vessel occlusion. 2. Moderate to advanced diffuse intracranial atherosclerotic change as above. Notable findings include moderate multifocal stenoses involving the right V4 segment, severe proximal left M2 stenosis, and severe proximal left A1 stenosis. The left vertebral artery is largely occluded beyond the takeoff of the left PICA. Electronically Signed   By: Rise Mu M.D.   On: 12/19/2019 23:27   MR BRAIN WO CONTRAST  Result Date: 12/19/2019 CLINICAL DATA:  Initial evaluation for acute syncope. EXAM: MRI HEAD WITHOUT CONTRAST MRA HEAD WITHOUT CONTRAST TECHNIQUE: Multiplanar, multiecho pulse sequences of the brain and surrounding structures were obtained without intravenous contrast. Angiographic images of the head were obtained using MRA technique without contrast. COMPARISON:  Prior head CT from earlier the same day. FINDINGS: MRI HEAD FINDINGS Brain: Age-related cerebral atrophy with mild chronic small vessel ischemic disease. No abnormal foci of restricted diffusion to suggest acute or subacute ischemia. Gray-white matter differentiation maintained. No encephalomalacia to suggest chronic cortical infarction. No foci of susceptibility artifact to suggest acute or chronic intracranial hemorrhage. No mass lesion, midline shift or mass effect. No hydrocephalus or extra-axial fluid collection. Pituitary gland suprasellar region normal. Midline structures intact. Vascular: Major intracranial vascular flow voids are maintained. Skull and upper cervical spine: Craniocervical junction within normal limits. Bone marrow signal intensity normal. No scalp soft tissue abnormality. Sinuses/Orbits: Globes and orbital soft tissues within normal limits. Moderate mucosal thickening seen throughout the ethmoidal air cells and maxillary sinuses, likely allergic/inflammatory nature. No mastoid effusion. Inner ear structures grossly normal.  Other: None. MRA HEAD FINDINGS ANTERIOR CIRCULATION: Visualized distal cervical segments of the internal carotid arteries are patent with symmetric antegrade flow. Scattered atheromatous irregularity within the carotid siphons without high-grade stenosis. ICA termini well perfused. Right A1 widely patent. Severe stenosis involving the proximal left A1 segment noted (series 5, image 117). Normal anterior communicating artery complex. Anterior cerebral arteries patent to their distal aspects without high-grade stenosis. No M1 stenosis or occlusion. Normal MCA bifurcations. Severe proximal left M2 stenosis, inferior division (series 1040, image 9). Distal MCA branches otherwise well perfused and symmetric. POSTERIOR CIRCULATION: Right vertebral artery dominant. Multifocal atheromatous irregularity throughout the right V4 segment with associated moderate multifocal stenoses. Right PICA patent. Left vertebral artery patent to the takeoff of the left PICA, but is largely occluded distally. Left PICA patent as well. Diffuse atheromatous irregularity throughout the basilar artery without high-grade stenosis. Superior cerebral arteries patent bilaterally. Both PCAs primarily supplied via the basilar. Scattered atheromatous irregularity within the left greater than right PCAs without high-grade stenosis. No intracranial aneurysm. IMPRESSION: MRI HEAD IMPRESSION: 1. No acute intracranial abnormality. 2. Age-related cerebral atrophy with mild chronic small vessel ischemic disease. 3. Moderate ethmoidal and maxillary sinusitis, likely allergic/inflammatory nature. MRA HEAD IMPRESSION: 1. Negative intracranial MRA for acute large vessel occlusion. 2. Moderate to advanced diffuse intracranial atherosclerotic change as above. Notable findings include moderate multifocal stenoses involving the right V4 segment, severe proximal left M2 stenosis, and severe proximal left A1 stenosis. The left vertebral artery is largely occluded  beyond the takeoff of the left PICA. Electronically Signed   By: Rise Mu M.D.   On: 12/19/2019 23:27   DG Chest Port 1 View  Result Date: 12/20/2019 CLINICAL DATA:  Shortness of breath.  Body pain. EXAM: PORTABLE CHEST 1 VIEW COMPARISON:  Yesterday FINDINGS: Mild elevation of the right diaphragm with mild overlying atelectasis. The left lung is clear. Normal heart size. Vascular pedicle widening. No visible effusion or pneumothorax. IMPRESSION: 1. Stable  compared to yesterday. 2. Elevated right diaphragm with mild scarring or atelectasis. Electronically Signed   By: Marnee Spring M.D.   On: 12/20/2019 08:50   DG CHEST PORT 1 VIEW  Result Date: 12/19/2019 CLINICAL DATA:  COVID-19 positive, diaphoresis, syncope EXAM: PORTABLE CHEST 1 VIEW COMPARISON:  07/20/2015 FINDINGS: Single frontal view of the chest demonstrates a stable cardiac silhouette. Linear consolidation at the right lung base likely reflects scarring or subsegmental atelectasis. No airspace disease, effusion, or pneumothorax. No acute bony abnormalities. IMPRESSION: 1. No acute intrathoracic process. Electronically Signed   By: Sharlet Salina M.D.   On: 12/19/2019 19:00   ECHOCARDIOGRAM LIMITED  Result Date: 12/19/2019    ECHOCARDIOGRAM REPORT   Patient Name:   BARACK NICODEMUS Date of Exam: 12/19/2019 Medical Rec #:  [id number]         Height:       76.0 in Accession #:    [id number]        Weight:       285.0 lb Date of Birth:  [birth year]          BSA:          2.575 m Patient Age:    69 years          BP:           202/86 mmHg Patient Gender: M                 HR:           68 bpm. Exam Location:  Inpatient Procedure: Limited Echo, Intracardiac Opacification Agent, Cardiac Doppler and            Color Doppler Indications:    Syncope  History:        Patient has prior history of Echocardiogram examinations, most                 recent 07/21/2015. Risk Factors:Hypertension, Dyslipidemia and                 morbid obesity. Covid +.   Sonographer:    Lavenia Atlas Referring Phys: [id number] JENNIFER YATES  Sonographer Comments: Patient is morbidly obese. Image acquisition challenging due to patient body habitus. IMPRESSIONS  1. Left ventricular ejection fraction, by estimation, is 60 to 65%. The left ventricle has normal function. The left ventricle has no regional wall motion abnormalities. There is moderate concentric left ventricular hypertrophy. Left ventricular diastolic parameters are consistent with Grade I diastolic dysfunction (impaired relaxation). Elevated left ventricular end-diastolic pressure.  2. Right ventricular systolic function is normal. The right ventricular size is normal. Tricuspid regurgitation signal is inadequate for assessing PA pressure.  3. The mitral valve is normal in structure. Mild mitral valve regurgitation. No evidence of mitral stenosis.  4. The aortic valve is normal in structure. Aortic valve regurgitation is not visualized. No aortic stenosis is present.  5. The inferior vena cava is normal in size with greater than 50% respiratory variability, suggesting right atrial pressure of 3 mmHg. FINDINGS  Left Ventricle: Left ventricular ejection fraction, by estimation, is 60 to 65%. The left ventricle has normal function. The left ventricle has no regional wall motion abnormalities. Definity contrast agent was given IV to delineate the left ventricular  endocardial borders. The left ventricular internal cavity size was normal in size. There is moderate concentric left ventricular hypertrophy. Left ventricular diastolic parameters are consistent with Grade I diastolic dysfunction (impaired relaxation). Elevated left ventricular end-diastolic pressure. Right Ventricle: The right  ventricular size is normal. No increase in right ventricular wall thickness. Right ventricular systolic function is normal. Tricuspid regurgitation signal is inadequate for assessing PA pressure. Left Atrium: Left atrial size was normal in  size. Right Atrium: Right atrial size was normal in size. Pericardium: There is no evidence of pericardial effusion. Mitral Valve: The mitral valve is normal in structure. Mild to moderate mitral annular calcification. Mild mitral valve regurgitation. No evidence of mitral valve stenosis. Tricuspid Valve: The tricuspid valve is normal in structure. Tricuspid valve regurgitation is not demonstrated. No evidence of tricuspid stenosis. Aortic Valve: The aortic valve is normal in structure. Aortic valve regurgitation is not visualized. No aortic stenosis is present. Pulmonic Valve: The pulmonic valve was normal in structure. Pulmonic valve regurgitation is not visualized. No evidence of pulmonic stenosis. Aorta: The aortic root is normal in size and structure. Venous: The inferior vena cava is normal in size with greater than 50% respiratory variability, suggesting right atrial pressure of 3 mmHg. IAS/Shunts: No atrial level shunt detected by color flow Doppler.  LEFT VENTRICLE PLAX 2D LVIDd:         5.50 cm  Diastology LVIDs:         3.70 cm  LV e' medial:    4.46 cm/s LV PW:         1.40 cm  LV E/e' medial:  17.6 LV IVS:        1.40 cm  LV e' lateral:   5.44 cm/s LVOT diam:     2.10 cm  LV E/e' lateral: 14.4 LVOT Area:     3.46 cm  LEFT ATRIUM         Index LA diam:    3.60 cm 1.40 cm/m   AORTA Ao Root diam: 3.20 cm MITRAL VALVE MV Area (PHT): 2.99 cm     SHUNTS MV Decel Time: 254 msec     Systemic Diam: 2.10 cm MV E velocity: 78.40 cm/s MV A velocity: 121.00 cm/s MV E/A ratio:  0.65 Armanda Magic MD Electronically signed by Armanda Magic MD Signature Date/Time: 12/19/2019/6:33:05 PM    Final

## 2019-12-21 NOTE — Progress Notes (Signed)
Pt had PVC's throughout night. Cardiac monitoring order expired. Notified MD. Cardiac monitoring reordered. Pt asymptomatic. Will continue to monitor.

## 2019-12-21 NOTE — TOC Initial Note (Addendum)
Transition of Care [facility]) - Initial/Assessment Note    Patient Details  Name: Alexander Gonzalez MRN: [id number] Date of Birth: 11-19-1950  [facility] CM/SW Contact:    [place] Pink, Student-Social Work Phone Number: 12/21/2019, 11:23 AM  Clinical Narrative:                 CSW talked with patient about discharge options. Patient indicated he would like to go home to spouse and would potentially want home health. The patient is refusing SNF.The patient indicated that he does not currently have any medical equipment at home. Sent referral to [facility] which is in network with his insurance, and they have accepted referral for home health PT and OT. Patient requesting rolling walker- will have it delivered to the home.   Expected Discharge Plan: Home w Home Health Services Barriers to Discharge: Continued Medical Work up   Patient Goals and CMS Choice Patient states their goals for this hospitalization and ongoing recovery are:: To go home with home health CMS Medicare.gov Compare Post Acute Care list provided to:: Patient Choice offered to / list presented to : Patient  Expected Discharge Plan and Services Expected Discharge Plan: Home w Home Health Services In-house Referral: Clinical Social Work Discharge Planning Services: CM Consult Post Acute Care Choice: Home Health Living arrangements for the past 2 months: Single Family Home                           HH Arranged: PT, OT HH Agency: [facility] Date [facility] Agency Contacted: 12/21/19 Time HH Agency Contacted: 1120 Representative spoke with at [facility] Agency: [place]  Prior Living Arrangements/Services Living arrangements for the past 2 months: Single Family Home Lives with:: Spouse, Adult Children Patient language and need for interpreter reviewed:: Yes Do you feel safe going back to the place where you live?: Yes      Need for Family Participation in Patient Care: No (Comment) Care giver support system in  place?: Yes (comment)   Criminal Activity/Legal Involvement Pertinent to Current Situation/Hospitalization: No - Comment as needed  Activities of Daily Living Home Assistive Devices/Equipment: None ADL Screening (condition at time of admission) Patient's cognitive ability adequate to safely complete daily activities?: Yes Is the patient deaf or have difficulty hearing?: No Does the patient have difficulty seeing, even when wearing glasses/contacts?: No Does the patient have difficulty concentrating, remembering, or making decisions?: No Patient able to express need for assistance with ADLs?: Yes Does the patient have difficulty dressing or bathing?: No Independently performs ADLs?: Yes (appropriate for developmental age) Does the patient have difficulty walking or climbing stairs?: No Weakness of Legs: Both Weakness of Arms/Hands: None  Permission Sought/Granted Permission sought to share information with : Facility Industrial/product designer granted to share information with : Yes, Verbal Permission Granted     Permission granted to share info w AGENCY: Home Health        Emotional Assessment   Attitude/Demeanor/Rapport: Engaged Affect (typically observed): Accepting Orientation: : Oriented to Self, Oriented to Place, Oriented to  Time, Oriented to Situation Alcohol / Substance Use: Not Applicable Psych Involvement: No (comment)  Admission diagnosis:  Syncope [R55] Fall, initial encounter [W19.XXXA] Syncope, unspecified syncope type [R55] COVID-19 [U07.1] Patient Active Problem List   Diagnosis Date Noted  . Syncope 12/19/2019  . COVID-19 virus infection 12/19/2019  . Cephalalgia   . Occlusion and stenosis of basilar artery   . Severe headache 07/19/2015  .  Abnormal computed tomography angiography of brain 07/19/2015  . CKD (chronic kidney disease), stage III 07/19/2015  . Acute hyperglycemia 07/19/2015  . Basilar artery stenosis   . Hypertensive emergency    . Hypertensive urgency 01/14/2015  . Nausea 01/14/2015  . Cerebrovascular disease 01/14/2015  . HTN (hypertension) 11/21/2012  . Gout 11/21/2012  . Preop cardiovascular exam 11/21/2012  . Hyperlipidemia 11/21/2012   PCP:  Renford Dills, MD Pharmacy:   [facility] [facility] [address] - [place], [place] - [address] [facility] [place] [address] Phone: [phone] Fax: [phone]  [id number] [place], [place] - [address] [facility] [address] [facility] [address] [place] [address] Phone: [phone] Fax: [phone]     Social Determinants of Health (SDOH) Interventions    Readmission Risk Interventions No flowsheet data found.

## 2019-12-22 ENCOUNTER — Inpatient Hospital Stay (HOSPITAL_COMMUNITY): Payer: Medicare HMO

## 2019-12-22 LAB — CBC WITH DIFFERENTIAL/PLATELET
Abs Immature Granulocytes: 0.02 10*3/uL (ref 0.00–0.07)
Basophils Absolute: 0 10*3/uL (ref 0.0–0.1)
Basophils Relative: 1 %
Eosinophils Absolute: 0 10*3/uL (ref 0.0–0.5)
Eosinophils Relative: 0 %
HCT: 44.7 % (ref 39.0–52.0)
Hemoglobin: 14.2 g/dL (ref 13.0–17.0)
Immature Granulocytes: 1 %
Lymphocytes Relative: 16 %
Lymphs Abs: 0.7 10*3/uL (ref 0.7–4.0)
MCH: 28.3 pg (ref 26.0–34.0)
MCHC: 31.8 g/dL (ref 30.0–36.0)
MCV: 89.2 fL (ref 80.0–100.0)
Monocytes Absolute: 0.6 10*3/uL (ref 0.1–1.0)
Monocytes Relative: 13 %
Neutro Abs: 3.1 10*3/uL (ref 1.7–7.7)
Neutrophils Relative %: 69 %
Platelets: 171 10*3/uL (ref 150–400)
RBC: 5.01 MIL/uL (ref 4.22–5.81)
RDW: 13.6 % (ref 11.5–15.5)
WBC: 4.4 10*3/uL (ref 4.0–10.5)
nRBC: 0 % (ref 0.0–0.2)

## 2019-12-22 LAB — COMPREHENSIVE METABOLIC PANEL
ALT: 18 U/L (ref 0–44)
AST: 23 U/L (ref 15–41)
Albumin: 3.5 g/dL (ref 3.5–5.0)
Alkaline Phosphatase: 48 U/L (ref 38–126)
Anion gap: 13 (ref 5–15)
BUN: 21 mg/dL (ref 8–23)
CO2: 22 mmol/L (ref 22–32)
Calcium: 9.4 mg/dL (ref 8.9–10.3)
Chloride: 103 mmol/L (ref 98–111)
Creatinine, Ser: 1.13 mg/dL (ref 0.61–1.24)
GFR calc Af Amer: >60 mL/min (ref >60)
GFR calc non Af Amer: >60 mL/min (ref >60)
Glucose, Bld: 103 mg/dL — ABNORMAL HIGH (ref 70–99)
Potassium: 4.1 mmol/L (ref 3.5–5.1)
Sodium: 138 mmol/L (ref 135–145)
Total Bilirubin: 0.8 mg/dL (ref 0.3–1.2)
Total Protein: 8.1 g/dL (ref 6.5–8.1)

## 2019-12-22 LAB — MAGNESIUM: Magnesium: 2 mg/dL (ref 1.7–2.4)

## 2019-12-22 LAB — GLUCOSE, CAPILLARY
Glucose-Capillary: 101 mg/dL — ABNORMAL HIGH (ref 70–99)
Glucose-Capillary: 91 mg/dL (ref 70–99)

## 2019-12-22 LAB — C-REACTIVE PROTEIN: CRP: 6.4 mg/dL — ABNORMAL HIGH (ref <1.0)

## 2019-12-22 LAB — FERRITIN: Ferritin: 110 ng/mL (ref 24–336)

## 2019-12-22 LAB — D-DIMER, QUANTITATIVE: D-Dimer, Quant: 1.11 ug/mL-FEU — ABNORMAL HIGH (ref 0.00–0.50)

## 2019-12-22 LAB — PHOSPHORUS: Phosphorus: 2.9 mg/dL (ref 2.5–4.6)

## 2019-12-22 MED ORDER — ACETAMINOPHEN 325 MG PO TABS: 650.0000 mg | ORAL_TABLET | Freq: Four times a day (QID) | ORAL | Status: AC | PRN

## 2019-12-22 MED ORDER — LOSARTAN POTASSIUM 100 MG PO TABS: 100.0000 mg | ORAL_TABLET | Freq: Every day | ORAL | 0 refills | Status: AC

## 2019-12-22 MED ORDER — LOSARTAN POTASSIUM 50 MG PO TABS: 100.0000 mg | ORAL_TABLET | Freq: Every day | ORAL | Status: DC

## 2019-12-22 MED ORDER — LOSARTAN POTASSIUM 50 MG PO TABS
100.0000 mg | ORAL_TABLET | Freq: Every day | ORAL | Status: DC
  Administered 2019-12-22: 100 mg via ORAL
  Filled 2019-12-22: qty 2

## 2019-12-22 NOTE — Discharge Instructions (Signed)
Person Under Monitoring Name: Alexander Gonzalez  Location: [address] [place] [address]   Infection Prevention Recommendations for Individuals Confirmed to have, or Being Evaluated for, 2019 Novel Coronavirus (COVID-19) Infection Who Receive Care at Home  Individuals who are confirmed to have, or are being evaluated for, COVID-19 should follow the prevention steps below until a healthcare provider or local or state health department says they can return to normal activities.  Stay home except to get medical care You should restrict activities outside your home, except for getting medical care. Do not go to work, school, or public areas, and do not use public transportation or taxis.  Call ahead before visiting your doctor Before your medical appointment, call the healthcare provider and tell them that you have, or are being evaluated for, COVID-19 infection. This will help the healthcare provider's office take steps to keep other people from getting infected. Ask your healthcare provider to call the local or state health department.  Monitor your symptoms Seek prompt medical attention if your illness is worsening (e.g., difficulty breathing). Before going to your medical appointment, call the healthcare provider and tell them that you have, or are being evaluated for, COVID-19 infection. Ask your healthcare provider to call the local or state health department.  Wear a facemask You should wear a facemask that covers your nose and mouth when you are in the same room with other people and when you visit a healthcare provider. People who live with or visit you should also wear a facemask while they are in the same room with you.  Separate yourself from other people in your home As much as possible, you should stay in a different room from other people in your home. Also, you should use a separate bathroom, if available.  Avoid sharing household items You should not  share dishes, drinking glasses, cups, eating utensils, towels, bedding, or other items with other people in your home. After using these items, you should wash them thoroughly with soap and water.  Cover your coughs and sneezes Cover your mouth and nose with a tissue when you cough or sneeze, or you can cough or sneeze into your sleeve. Throw used tissues in a lined trash can, and immediately wash your hands with soap and water for at least 20 seconds or use an alcohol-based hand rub.  Wash your [employer] your hands often and thoroughly with soap and water for at least 20 seconds. You can use an alcohol-based hand sanitizer if soap and water are not available and if your hands are not visibly dirty. Avoid touching your eyes, nose, and mouth with unwashed hands.   Prevention Steps for Caregivers and Household Members of Individuals Confirmed to have, or Being Evaluated for, COVID-19 Infection Being Cared for in the Home  If you live with, or provide care at home for, a person confirmed to have, or being evaluated for, COVID-19 infection please follow these guidelines to prevent infection:  Follow healthcare provider's instructions Make sure that you understand and can help the patient follow any healthcare provider instructions for all care.  Provide for the patient's basic needs You should help the patient with basic needs in the home and provide support for getting groceries, prescriptions, and other personal needs.  Monitor the patient's symptoms If they are getting sicker, call his or her medical provider and tell them that the patient has, or is being evaluated for, COVID-19 infection. This will help the healthcare provider's office  take steps to keep other people from getting infected. Ask the healthcare provider to call the local or state health department.  Limit the number of people who have contact with the patient  If possible, have only one caregiver for the  patient.  Other household members should stay in another home or place of residence. If this is not possible, they should stay  in another room, or be separated from the patient as much as possible. Use a separate bathroom, if available.  Restrict visitors who do not have an essential need to be in the home.  Keep older adults, very young children, and other sick people away from the patient Keep older adults, very young children, and those who have compromised immune systems or chronic health conditions away from the patient. This includes people with chronic heart, lung, or kidney conditions, diabetes, and cancer.  Ensure good ventilation Make sure that shared spaces in the home have good air flow, such as from an air conditioner or an opened window, weather permitting.  Wash your hands often  Wash your hands often and thoroughly with soap and water for at least 20 seconds. You can use an alcohol based hand sanitizer if soap and water are not available and if your hands are not visibly dirty.  Avoid touching your eyes, nose, and mouth with unwashed hands.  Use disposable paper towels to dry your hands. If not available, use dedicated cloth towels and replace them when they become wet.  Wear a facemask and gloves  Wear a disposable facemask at all times in the room and gloves when you touch or have contact with the patient's blood, body fluids, and/or secretions or excretions, such as sweat, saliva, sputum, nasal mucus, vomit, urine, or feces.  Ensure the mask fits over your nose and mouth tightly, and do not touch it during use.  Throw out disposable facemasks and gloves after using them. Do not reuse.  Wash your hands immediately after removing your facemask and gloves.  If your personal clothing becomes contaminated, carefully remove clothing and launder. Wash your hands after handling contaminated clothing.  Place all used disposable facemasks, gloves, and other waste in a lined  container before disposing them with other household waste.  Remove gloves and wash your hands immediately after handling these items.  Do not share dishes, glasses, or other household items with the patient  Avoid sharing household items. You should not share dishes, drinking glasses, cups, eating utensils, towels, bedding, or other items with a patient who is confirmed to have, or being evaluated for, COVID-19 infection.  After the person uses these items, you should wash them thoroughly with soap and water.  Wash laundry thoroughly  Immediately remove and wash clothes or bedding that have blood, body fluids, and/or secretions or excretions, such as sweat, saliva, sputum, nasal mucus, vomit, urine, or feces, on them.  Wear gloves when handling laundry from the patient.  Read and follow directions on labels of laundry or clothing items and detergent. In general, wash and dry with the warmest temperatures recommended on the label.  Clean all areas the individual has used often  Clean all touchable surfaces, such as counters, tabletops, doorknobs, bathroom fixtures, toilets, phones, keyboards, tablets, and bedside tables, every day. Also, clean any surfaces that may have blood, body fluids, and/or secretions or excretions on them.  Wear gloves when cleaning surfaces the patient has come in contact with.  Use a diluted bleach solution (e.g., dilute bleach with 1 part  bleach and 10 parts water) or a household disinfectant with a label that says EPA-registered for coronaviruses. To make a bleach solution at home, add 1 tablespoon of bleach to 1 quart (4 cups) of water. For a larger supply, add  cup of bleach to 1 gallon (16 cups) of water.  Read labels of cleaning products and follow recommendations provided on product labels. Labels contain instructions for safe and effective use of the cleaning product including precautions you should take when applying the product, such as wearing gloves or  eye protection and making sure you have good ventilation during use of the product.  Remove gloves and wash hands immediately after cleaning.  Monitor yourself for signs and symptoms of illness Caregivers and household members are considered close contacts, should monitor their health, and will be asked to limit movement outside of the home to the extent possible. Follow the monitoring steps for close contacts listed on the symptom monitoring form.   ? If you have additional questions, contact your local health department or call the epidemiologist on call at [phone] (available 24/7). ? This guidance is subject to change. For the most up-to-date guidance from [facility], please refer to their website: [email or web address].hu  Follow with Primary MD Renford Dills, MD in 7 days   Get CBC, CMP, 2 view Chest X ray checked  by Primary MD next visit.    Activity: As tolerated with Full fall precautions use walker/cane & assistance as needed   Disposition Home    Diet: Heart Healthy    On your next visit with your primary care physician please Get Medicines reviewed and adjusted.   Please request your Prim.MD to go over all Hospital Tests and Procedure/Radiological results at the follow up, please get all Hospital records sent to your Prim MD by signing hospital release before you go home.   If you experience worsening of your admission symptoms, develop shortness of breath, life threatening emergency, suicidal or homicidal thoughts you must seek medical attention immediately by calling 911 or calling your MD immediately  if symptoms less severe.  You Must read complete instructions/literature along with all the possible adverse reactions/side effects for all the Medicines you take and that have been prescribed to you. Take any new Medicines after you have completely understood and accpet all the possible adverse reactions/side effects.    Do not drive, operating heavy machinery, perform activities at heights, swimming or participation in water activities or provide baby sitting services if your were admitted for syncope or siezures until you have seen by Primary MD or a Neurologist and advised to do so again.  Do not drive when taking Pain medications.    Do not take more than prescribed Pain, Sleep and Anxiety Medications  Special Instructions: If you have smoked or chewed Tobacco  in the last 2 yrs please stop smoking, stop any regular Alcohol  and or any Recreational drug use.  Wear Seat belts while driving.   Please note  You were cared for by a hospitalist during your hospital stay. If you have any questions about your discharge medications or the care you received while you were in the hospital after you are discharged, you can call the unit and asked to speak with the hospitalist on call if the hospitalist that took care of you is not available. Once you are discharged, your primary care physician will handle any further medical issues. Please note that NO REFILLS for any discharge medications will be authorized once you are  discharged, as it is imperative that you return to your primary care physician (or establish a relationship with a primary care physician if you do not have one) for your aftercare needs so that they can reassess your need for medications and monitor your lab values.

## 2019-12-22 NOTE — Discharge Summary (Addendum)
Alexander Gonzalez, is a 69 y.o. male  DOB 1950-07-25  MRN [id number].  Admission date:  12/19/2019  Admitting Physician  Jonah Blue, MD  Discharge Date:  12/22/2019   Primary MD  Renford Dills, MD  Recommendations for primary care physician for things to follow:  - please check CBC, CMP during next visit.  -Patient will need to have outpatient neurology follow-up, as patient has failed to follow-up after his 2017 regarding intracranial atherosclerosis, but he was supposed to follow with Dr. Corliss Skains.   Admission Diagnosis  Syncope [R55] Fall, initial encounter [W19.XXXA] Syncope, unspecified syncope type [R55] COVID-19 [U07.1]   Discharge Diagnosis  Syncope [R55] Fall, initial encounter [W19.XXXA] Syncope, unspecified syncope type [R55] COVID-19 [U07.1]    Principal Problem:   Syncope Active Problems:   HTN (hypertension)   Hyperlipidemia   Abnormal computed tomography angiography of brain   CKD (chronic kidney disease), stage III   COVID-19 virus infection      Past Medical History:  Diagnosis Date  . Hyperlipidemia   . Hypertension   . Mini stroke (HCC) 1996  . Osteoarthritis     Past Surgical History:  Procedure Laterality Date  . IR GENERIC HISTORICAL  11/01/2015   IR RADIOLOGIST EVAL & MGMT 11/01/2015 [facility]-INTER[facility] RAD  . KNEE SURGERY  1976   right  . KNEE SURGERY  1980'2   right  . REPLACEMENT TOTAL KNEE  1996   left       History of present illness and  Hospital Course:     Kindly see H&P for history of present illness and admission details, please review complete Labs, Consult reports and Test reports for all details in brief  HPI  from the history and physical done on the day of admission 12/19/2019  HPI: Alexander Gonzalez is a 69 y.o. male with medical history significant of HTN; HLD; and h/o CVA presenting with syncope. He reports that he was microwaving coffee on  Friday AM when he passed out.  He felt fine prior, but started to feel bad and so was walking to the kitchen table.  He awoke maybe 15 minutes later, lying in the kitchen floor.  He was unable to get up and so he dragged himself to the stairs and crawled up to wake up his wife.  He spent all weekend in bed.  He notices diffuse myalgias, particularly in his neck and shoulders.  He continues to notice dizziness and blurred vision lasting about 10 seconds after he turns his head.  He had another episode in 2018 while in [place].  His wife reports that a couple of months ago he had a similar episode to now - very tired, headaches, vertigo, but did not lose consciousness.  He also has a blockage in his left carotid (?) - was supposed to see a neurologist for monitoring.  He may not be compliant with meds, certainly has dietary indiscretion.  He was supposed to be urologist or nephrologist for his kidney elevation.   ED Course:  Syncope  Friday, weakness since.  ACS evaluation negative, had CT negative.  On ASA/Plavix.    Hospital Course   Brief Narrative: Patient is a 69 y.o. male with PMHx of CVA, HTN, HLD-who has been feeling very fatigued/tired, feverish, myalgias for the past 3-4 days-this past Friday-while making coffee-patient felt more weak-and while attempting to walk to a chair he apparently passed out.  Throughout the weekend-he continued to have myalgias and fever and weakness-he gradually developed some shortness of breath-he subsequently presented to the emergency room-where he was found to have COVID-19, mild AKI on CKD stage IIIa-and subsequently admitted to the hospitalist service.  See below for further details.  COVID-19 vaccinated status: Vaccinated-around May-J&J vaccine.  Significant Events: 9/20>> [facility]-syncope x3 days prior to presentation-worsening weakness, fever/myalgias/exertional shortness of breath.  Significant studies: 9/20>> CT head: No acute  intracranial findings/skull fracture. 9/20>> CT C-spine: No acute cervical spine fracture-moderate to advanced degenerative cervical spondylosis 9/20>> MRI brain: No acute intracranial abnormality 9/20>> MRI brain: Negative intracranial MRA for acute large vessel occlusion, diffuse intracranial atherosclerotic change 9/20>> Echo: EF 60-65%, grade 1 diastolic dysfunction 9/20>> chest x-ray: No acute intrathoracic process. 9/21>> chest x-ray: No obvious pneumonia.  COVID-19 medications: Casirivimab-imdevimab: 9/20 x1   Antibiotics: None  Microbiology data: None  Procedures: None  Consults: None   Syncope:  - Probably related to vasovagal syncope in the setting of acute infection. - Telemetry unremarkable - Echo with preserved EF. -No recurrence during hospital stay  COVID-19 infection:  -Patient is vaccinated . -He received monoclonal antibodies -No evidence of pneumonia on repeat chest x-ray, no evidence of hypoxia, saturating 98% on room air today, no indication for steroids or remdesivir. -He was encouraged again today to use incentive spirometry, flutter valve, and to get out of bed to chair   Mild AKI on CKD stage IIIa:  -Resolved   HTN: -Continue with home medication including amlodipine and losartan  HLD: Continue statin  History of CVA/intracranial atherosclerosis -This is in 2017, by reviewing neurology notes then, recommendation was to follow possible referral for Dr. Corliss Skainseveshwar, patient has failed to follow, I have discussed with both and at wife about importance of follow-up, and the finding of MRA head and intracranial atherosclerosis, they will arrange as an outpatient. -Continue with Plavix and statin. -Carotid Doppler was obtained, right and left internal carotid arteries with 60 to 79% stenosis, continue with Plavix and statin, patient was instructed to follow with his neurologist as an outpatient.  Obesity: Estimated body mass index is  34.11 kg/m as calculated from the following:   Height as of this encounter: 6\' 4"  (1.93 m).   Weight as of this encounter: 127.1 kg.    Discharge Condition:  Stable  Follow UP   Follow-up Information    [facility] Follow up.   Specialty: Home Health Services Why: Home Health PT/OT services arranged. They will contact you about 48 hours after discharge to schedule visit.  Contact information: [address] [place] [address] [phone]        [facility] Follow [facility].   Why: Rolling walker to be delivered to the room prior to discharge.  Contact information: [address] [place] [id number] [phone]        Nita SicklePatel, Donika K, DO Follow up in 2 week(s).   Specialty: Neurology Contact information: [address] [place] [address] [phone]  Discharge Instructions  and  Discharge Medications     Discharge Instructions    Diet - low sodium heart healthy   Complete by: As directed    Discharge instructions   Complete by: As directed    Follow with Primary MD Renford Dills, MD in 7 days   Get CBC, CMP, 2 view Chest X ray checked  by Primary MD next visit.    Activity: As tolerated with Full fall precautions use walker/cane & assistance as needed   Disposition Home    Diet: Heart Healthy .  For Heart failure patients - Check your Weight same time everyday, if you gain over 2 pounds, or you develop in leg swelling, experience more shortness of breath or chest pain, call your Primary MD immediately. Follow Cardiac Low Salt Diet and 1.5 lit/day fluid restriction.   On your next visit with your primary care physician please Get Medicines reviewed and adjusted.   Please request your Prim.MD to go over all Hospital Tests and Procedure/Radiological results at the follow up, please get all Hospital records sent to your Prim MD by signing hospital release before  you go home.   If you experience worsening of your admission symptoms, develop shortness of breath, life threatening emergency, suicidal or homicidal thoughts you must seek medical attention immediately by calling 911 or calling your MD immediately  if symptoms less severe.  You Must read complete instructions/literature along with all the possible adverse reactions/side effects for all the Medicines you take and that have been prescribed to you. Take any new Medicines after you have completely understood and accpet all the possible adverse reactions/side effects.   Do not drive, operating heavy machinery, perform activities at heights, swimming or participation in water activities or provide baby sitting services if your were admitted for syncope or siezures until you have seen by Primary MD or a Neurologist and advised to do so again.  Do not drive when taking Pain medications.    Do not take more than prescribed Pain, Sleep and Anxiety Medications  Special Instructions: If you have smoked or chewed Tobacco  in the last 2 yrs please stop smoking, stop any regular Alcohol  and or any Recreational drug use.  Wear Seat belts while driving.   Please note  You were cared for by a hospitalist during your hospital stay. If you have any questions about your discharge medications or the care you received while you were in the hospital after you are discharged, you can call the unit and asked to speak with the hospitalist on call if the hospitalist that took care of you is not available. Once you are discharged, your primary care physician will handle any further medical issues. Please note that NO REFILLS for any discharge medications will be authorized once you are discharged, as it is imperative that you return to your primary care physician (or establish a relationship with a primary care physician if you do not have one) for your aftercare needs so that they can reassess your need for medications  and monitor your lab values.   Increase activity slowly   Complete by: As directed      Allergies as of 12/22/2019   No Known Allergies     Medication List    STOP taking these medications   aspirin 81 MG EC tablet   hydrALAZINE 100 MG tablet Commonly known as: APRESOLINE   HYDROcodone-acetaminophen 5-325 MG tablet Commonly known as: NORCO/VICODIN   ondansetron 4 MG disintegrating tablet  Commonly known as: Zofran ODT     TAKE these medications   acetaminophen 325 MG tablet Commonly known as: TYLENOL Take 2 tablets (650 mg total) by mouth every 6 (six) hours as needed for mild pain (or Fever >/= 101). What changed:   medication strength  how much to take  reasons to take this   amLODipine 10 MG tablet Commonly known as: NORVASC Take 1 tablet (10 mg total) by mouth daily.   clopidogrel 75 MG tablet Commonly known as: PLAVIX Take 75 mg by mouth daily.   losartan 100 MG tablet Commonly known as: COZAAR Take 1 tablet (100 mg total) by mouth daily.   simvastatin 40 MG tablet Commonly known as: ZOCOR Take 0.5 tablets (20 mg total) by mouth every evening. Take 20 mg -half a tablet daily What changed:   how much to take  when to take this  additional instructions            Durable Medical Equipment  (From admission, onward)         Start     Ordered   12/21/19 1530  For home use only DME Walker rolling  Once       Question Answer Comment  Walker: With 5 Inch Wheels   Patient needs a walker to treat with the following condition Weakness      12/21/19 1530            Diet and Activity recommendation: See Discharge Instructions above   Consults obtained - none   Major procedures and Radiology Reports - PLEASE review detailed and final reports for all details, in brief -      CT Head Wo Contrast  Result Date: 12/19/2019 CLINICAL DATA:  Dizziness.  Fell. EXAM: CT HEAD WITHOUT CONTRAST CT CERVICAL SPINE WITHOUT CONTRAST TECHNIQUE:  Multidetector CT imaging of the head and cervical spine was performed following the standard protocol without intravenous contrast. Multiplanar CT image reconstructions of the cervical spine were also generated. COMPARISON:  04/24/2017 FINDINGS: CT HEAD FINDINGS Brain: Stable mild age related cerebral atrophy, ventriculomegaly and periventricular white matter disease. No extra-axial fluid collections are identified. No CT findings for acute hemispheric infarction or intracranial hemorrhage. No mass lesions. The brainstem and cerebellum are normal. Vascular: Scattered atherosclerotic calcifications but no aneurysm or hyperdense vessels. Skull: No skull fracture or bone lesions. Sinuses/Orbits: Moderate pansinus mucoperiosteal thickening. The mastoid air cells and middle ear cavities are clear. The globes are intact. Other: No scalp lesions or hematoma. CT CERVICAL SPINE FINDINGS Alignment: Normal Skull base and vertebrae: Moderate to advanced degenerative cervical spondylosis for age with multilevel disc disease and facet disease. No acute cervical spine fracture is identified. The facets are normally aligned. No facet or laminar fractures. Soft tissues and spinal canal: No prevertebral fluid or swelling. No visible canal hematoma. Disc levels: Fairly generous spinal canal. No large disc protrusions or significant canal stenosis. Mild to moderate bilateral foraminal narrowing due to uncinate spurring and facet disease. Upper chest: The lung apices are clear. Other: No neck mass or adenopathy. The thyroid gland is unremarkable. IMPRESSION: 1. Stable mild age related cerebral atrophy, ventriculomegaly and periventricular white matter disease. 2. No acute intracranial findings or skull fracture. 3. Moderate to advanced degenerative cervical spondylosis for age with multilevel disc disease and facet disease but no acute cervical spine fracture. Electronically Signed   By: Rudie Meyer M.D.   On: 12/19/2019 09:12   CT  Cervical Spine Wo Contrast  Result Date: 12/19/2019  CLINICAL DATA:  Dizziness.  Fell. EXAM: CT HEAD WITHOUT CONTRAST CT CERVICAL SPINE WITHOUT CONTRAST TECHNIQUE: Multidetector CT imaging of the head and cervical spine was performed following the standard protocol without intravenous contrast. Multiplanar CT image reconstructions of the cervical spine were also generated. COMPARISON:  04/24/2017 FINDINGS: CT HEAD FINDINGS Brain: Stable mild age related cerebral atrophy, ventriculomegaly and periventricular white matter disease. No extra-axial fluid collections are identified. No CT findings for acute hemispheric infarction or intracranial hemorrhage. No mass lesions. The brainstem and cerebellum are normal. Vascular: Scattered atherosclerotic calcifications but no aneurysm or hyperdense vessels. Skull: No skull fracture or bone lesions. Sinuses/Orbits: Moderate pansinus mucoperiosteal thickening. The mastoid air cells and middle ear cavities are clear. The globes are intact. Other: No scalp lesions or hematoma. CT CERVICAL SPINE FINDINGS Alignment: Normal Skull base and vertebrae: Moderate to advanced degenerative cervical spondylosis for age with multilevel disc disease and facet disease. No acute cervical spine fracture is identified. The facets are normally aligned. No facet or laminar fractures. Soft tissues and spinal canal: No prevertebral fluid or swelling. No visible canal hematoma. Disc levels: Fairly generous spinal canal. No large disc protrusions or significant canal stenosis. Mild to moderate bilateral foraminal narrowing due to uncinate spurring and facet disease. Upper chest: The lung apices are clear. Other: No neck mass or adenopathy. The thyroid gland is unremarkable. IMPRESSION: 1. Stable mild age related cerebral atrophy, ventriculomegaly and periventricular white matter disease. 2. No acute intracranial findings or skull fracture. 3. Moderate to advanced degenerative cervical spondylosis for  age with multilevel disc disease and facet disease but no acute cervical spine fracture. Electronically Signed   By: Rudie Meyer M.D.   On: 12/19/2019 09:12   MR ANGIO HEAD WO CONTRAST  Result Date: 12/19/2019 CLINICAL DATA:  Initial evaluation for acute syncope. EXAM: MRI HEAD WITHOUT CONTRAST MRA HEAD WITHOUT CONTRAST TECHNIQUE: Multiplanar, multiecho pulse sequences of the brain and surrounding structures were obtained without intravenous contrast. Angiographic images of the head were obtained using MRA technique without contrast. COMPARISON:  Prior head CT from earlier the same day. FINDINGS: MRI HEAD FINDINGS Brain: Age-related cerebral atrophy with mild chronic small vessel ischemic disease. No abnormal foci of restricted diffusion to suggest acute or subacute ischemia. Gray-white matter differentiation maintained. No encephalomalacia to suggest chronic cortical infarction. No foci of susceptibility artifact to suggest acute or chronic intracranial hemorrhage. No mass lesion, midline shift or mass effect. No hydrocephalus or extra-axial fluid collection. Pituitary gland suprasellar region normal. Midline structures intact. Vascular: Major intracranial vascular flow voids are maintained. Skull and upper cervical spine: Craniocervical junction within normal limits. Bone marrow signal intensity normal. No scalp soft tissue abnormality. Sinuses/Orbits: Globes and orbital soft tissues within normal limits. Moderate mucosal thickening seen throughout the ethmoidal air cells and maxillary sinuses, likely allergic/inflammatory nature. No mastoid effusion. Inner ear structures grossly normal. Other: None. MRA HEAD FINDINGS ANTERIOR CIRCULATION: Visualized distal cervical segments of the internal carotid arteries are patent with symmetric antegrade flow. Scattered atheromatous irregularity within the carotid siphons without high-grade stenosis. ICA termini well perfused. Right A1 widely patent. Severe stenosis  involving the proximal left A1 segment noted (series 5, image 117). Normal anterior communicating artery complex. Anterior cerebral arteries patent to their distal aspects without high-grade stenosis. No M1 stenosis or occlusion. Normal MCA bifurcations. Severe proximal left M2 stenosis, inferior division (series 1040, image 9). Distal MCA branches otherwise well perfused and symmetric. POSTERIOR CIRCULATION: Right vertebral artery dominant. Multifocal atheromatous irregularity throughout the right  V4 segment with associated moderate multifocal stenoses. Right PICA patent. Left vertebral artery patent to the takeoff of the left PICA, but is largely occluded distally. Left PICA patent as well. Diffuse atheromatous irregularity throughout the basilar artery without high-grade stenosis. Superior cerebral arteries patent bilaterally. Both PCAs primarily supplied via the basilar. Scattered atheromatous irregularity within the left greater than right PCAs without high-grade stenosis. No intracranial aneurysm. IMPRESSION: MRI HEAD IMPRESSION: 1. No acute intracranial abnormality. 2. Age-related cerebral atrophy with mild chronic small vessel ischemic disease. 3. Moderate ethmoidal and maxillary sinusitis, likely allergic/inflammatory nature. MRA HEAD IMPRESSION: 1. Negative intracranial MRA for acute large vessel occlusion. 2. Moderate to advanced diffuse intracranial atherosclerotic change as above. Notable findings include moderate multifocal stenoses involving the right V4 segment, severe proximal left M2 stenosis, and severe proximal left A1 stenosis. The left vertebral artery is largely occluded beyond the takeoff of the left PICA. Electronically Signed   By: Rise Mu M.D.   On: 12/19/2019 23:27   MR BRAIN WO CONTRAST  Result Date: 12/19/2019 CLINICAL DATA:  Initial evaluation for acute syncope. EXAM: MRI HEAD WITHOUT CONTRAST MRA HEAD WITHOUT CONTRAST TECHNIQUE: Multiplanar, multiecho pulse sequences  of the brain and surrounding structures were obtained without intravenous contrast. Angiographic images of the head were obtained using MRA technique without contrast. COMPARISON:  Prior head CT from earlier the same day. FINDINGS: MRI HEAD FINDINGS Brain: Age-related cerebral atrophy with mild chronic small vessel ischemic disease. No abnormal foci of restricted diffusion to suggest acute or subacute ischemia. Gray-white matter differentiation maintained. No encephalomalacia to suggest chronic cortical infarction. No foci of susceptibility artifact to suggest acute or chronic intracranial hemorrhage. No mass lesion, midline shift or mass effect. No hydrocephalus or extra-axial fluid collection. Pituitary gland suprasellar region normal. Midline structures intact. Vascular: Major intracranial vascular flow voids are maintained. Skull and upper cervical spine: Craniocervical junction within normal limits. Bone marrow signal intensity normal. No scalp soft tissue abnormality. Sinuses/Orbits: Globes and orbital soft tissues within normal limits. Moderate mucosal thickening seen throughout the ethmoidal air cells and maxillary sinuses, likely allergic/inflammatory nature. No mastoid effusion. Inner ear structures grossly normal. Other: None. MRA HEAD FINDINGS ANTERIOR CIRCULATION: Visualized distal cervical segments of the internal carotid arteries are patent with symmetric antegrade flow. Scattered atheromatous irregularity within the carotid siphons without high-grade stenosis. ICA termini well perfused. Right A1 widely patent. Severe stenosis involving the proximal left A1 segment noted (series 5, image 117). Normal anterior communicating artery complex. Anterior cerebral arteries patent to their distal aspects without high-grade stenosis. No M1 stenosis or occlusion. Normal MCA bifurcations. Severe proximal left M2 stenosis, inferior division (series 1040, image 9). Distal MCA branches otherwise well perfused and  symmetric. POSTERIOR CIRCULATION: Right vertebral artery dominant. Multifocal atheromatous irregularity throughout the right V4 segment with associated moderate multifocal stenoses. Right PICA patent. Left vertebral artery patent to the takeoff of the left PICA, but is largely occluded distally. Left PICA patent as well. Diffuse atheromatous irregularity throughout the basilar artery without high-grade stenosis. Superior cerebral arteries patent bilaterally. Both PCAs primarily supplied via the basilar. Scattered atheromatous irregularity within the left greater than right PCAs without high-grade stenosis. No intracranial aneurysm. IMPRESSION: MRI HEAD IMPRESSION: 1. No acute intracranial abnormality. 2. Age-related cerebral atrophy with mild chronic small vessel ischemic disease. 3. Moderate ethmoidal and maxillary sinusitis, likely allergic/inflammatory nature. MRA HEAD IMPRESSION: 1. Negative intracranial MRA for acute large vessel occlusion. 2. Moderate to advanced diffuse intracranial atherosclerotic change as above. Notable findings include  moderate multifocal stenoses involving the right V4 segment, severe proximal left M2 stenosis, and severe proximal left A1 stenosis. The left vertebral artery is largely occluded beyond the takeoff of the left PICA. Electronically Signed   By: Rise Mu M.D.   On: 12/19/2019 23:27   DG Chest Port 1 View  Result Date: 12/20/2019 CLINICAL DATA:  Shortness of breath.  Body pain. EXAM: PORTABLE CHEST 1 VIEW COMPARISON:  Yesterday FINDINGS: Mild elevation of the right diaphragm with mild overlying atelectasis. The left lung is clear. Normal heart size. Vascular pedicle widening. No visible effusion or pneumothorax. IMPRESSION: 1. Stable compared to yesterday. 2. Elevated right diaphragm with mild scarring or atelectasis. Electronically Signed   By: Marnee Spring M.D.   On: 12/20/2019 08:50   DG CHEST PORT 1 VIEW  Result Date: 12/19/2019 CLINICAL DATA:   COVID-19 positive, diaphoresis, syncope EXAM: PORTABLE CHEST 1 VIEW COMPARISON:  07/20/2015 FINDINGS: Single frontal view of the chest demonstrates a stable cardiac silhouette. Linear consolidation at the right lung base likely reflects scarring or subsegmental atelectasis. No airspace disease, effusion, or pneumothorax. No acute bony abnormalities. IMPRESSION: 1. No acute intrathoracic process. Electronically Signed   By: Sharlet Salina M.D.   On: 12/19/2019 19:00   VAS US CAROTID  Result Date: 12/21/2019 Carotid Arterial Duplex Study Indications:  Syncope and History of stenosis. Risk Factors: Hypertension, hyperlipidemia. Performing Technologist: Jannet Askew RCT RDMS  Examination Guidelines: A complete evaluation includes B-mode imaging, spectral Doppler, color Doppler, and power Doppler as needed of all accessible portions of each vessel. Bilateral testing is considered an integral part of a complete examination. Limited examinations for reoccurring indications may be performed as noted.  Right Carotid Findings: +----------+--------+--------+--------+-------------------+---------+           PSV cm/sEDV cm/sStenosisPlaque Description Comments  +----------+--------+--------+--------+-------------------+---------+ CCA Prox  126     25                                           +----------+--------+--------+--------+-------------------+---------+ CCA Distal123     25                                           +----------+--------+--------+--------+-------------------+---------+ ICA Prox  328     71      60-79%  calcific and smoothShadowing +----------+--------+--------+--------+-------------------+---------+ ICA Distal210     57      40-59%                               +----------+--------+--------+--------+-------------------+---------+ ECA       267     38      >50%                                 +----------+--------+--------+--------+-------------------+---------+  +----------+--------+-------+--------+-------------------+           PSV cm/sEDV cmsDescribeArm Pressure (mmHG) +----------+--------+-------+--------+-------------------+ Subclavian250     7                                  +----------+--------+-------+--------+-------------------+ +---------+--------+--+--------+--+---------+ VertebralPSV cm/s56EDV cm/s20Antegrade +---------+--------+--+--------+--+---------+  Left Carotid Findings: +----------+--------+--------+--------+----------------------+--------+  PSV cm/sEDV cm/sStenosisPlaque Description    Comments +----------+--------+--------+--------+----------------------+--------+ CCA Prox  202     15                                             +----------+--------+--------+--------+----------------------+--------+ CCA Distal198     17                                             +----------+--------+--------+--------+----------------------+--------+ ICA Prox  351     64      60-79%  smooth and calcific            +----------+--------+--------+--------+----------------------+--------+ ICA Distal83      19                                             +----------+--------+--------+--------+----------------------+--------+ ECA       226     26      >50%    smooth and homogeneous         +----------+--------+--------+--------+----------------------+--------+ +----------+--------+--------+--------+-------------------+           PSV cm/sEDV cm/sDescribeArm Pressure (mmHG) +----------+--------+--------+--------+-------------------+ [id number]                                         +----------+--------+--------+--------+-------------------+ +---------+--------+--------+--------------+ VertebralPSV cm/sEDV cm/sNot identified +---------+--------+--------+--------------+   Summary: Right Carotid: Velocities in the right ICA are consistent with a 60-79%                stenosis. The ECA  appears >50% stenosed. Left Carotid: Velocities in the left ICA are consistent with a 60-79% stenosis.               The ECA appears >50% stenosed. Vertebrals: Right vertebral artery demonstrates antegrade flow. Left vertebral             artery was not visualized. *See table(s) above for measurements and observations.  Electronically signed by Coral Else MD on 12/21/2019 at 9:33:00 PM.    Final    ECHOCARDIOGRAM LIMITED  Result Date: 12/19/2019    ECHOCARDIOGRAM REPORT   Patient Name:   REUEL LAMADRID Date of Exam: 12/19/2019 Medical Rec #:  [id number]         Height:       76.0 in Accession #:    [id number]        Weight:       285.0 lb Date of Birth:  1950-12-09          BSA:          2.575 m Patient Age:    69 years          BP:           202/86 mmHg Patient Gender: M                 HR:           68 bpm. Exam Location:  Inpatient Procedure: Limited Echo, Intracardiac Opacification Agent, Cardiac Doppler and            Color Doppler Indications:  Syncope  History:        Patient has prior history of Echocardiogram examinations, most                 recent 07/21/2015. Risk Factors:Hypertension, Dyslipidemia and                 morbid obesity. Covid +.  Sonographer:    Lavenia Atlas Referring Phys: [id number] JENNIFER YATES  Sonographer Comments: Patient is morbidly obese. Image acquisition challenging due to patient body habitus. IMPRESSIONS  1. Left ventricular ejection fraction, by estimation, is 60 to 65%. The left ventricle has normal function. The left ventricle has no regional wall motion abnormalities. There is moderate concentric left ventricular hypertrophy. Left ventricular diastolic parameters are consistent with Grade I diastolic dysfunction (impaired relaxation). Elevated left ventricular end-diastolic pressure.  2. Right ventricular systolic function is normal. The right ventricular size is normal. Tricuspid regurgitation signal is inadequate for assessing PA pressure.  3. The mitral valve is  normal in structure. Mild mitral valve regurgitation. No evidence of mitral stenosis.  4. The aortic valve is normal in structure. Aortic valve regurgitation is not visualized. No aortic stenosis is present.  5. The inferior vena cava is normal in size with greater than 50% respiratory variability, suggesting right atrial pressure of 3 mmHg. FINDINGS  Left Ventricle: Left ventricular ejection fraction, by estimation, is 60 to 65%. The left ventricle has normal function. The left ventricle has no regional wall motion abnormalities. Definity contrast agent was given IV to delineate the left ventricular  endocardial borders. The left ventricular internal cavity size was normal in size. There is moderate concentric left ventricular hypertrophy. Left ventricular diastolic parameters are consistent with Grade I diastolic dysfunction (impaired relaxation). Elevated left ventricular end-diastolic pressure. Right Ventricle: The right ventricular size is normal. No increase in right ventricular wall thickness. Right ventricular systolic function is normal. Tricuspid regurgitation signal is inadequate for assessing PA pressure. Left Atrium: Left atrial size was normal in size. Right Atrium: Right atrial size was normal in size. Pericardium: There is no evidence of pericardial effusion. Mitral Valve: The mitral valve is normal in structure. Mild to moderate mitral annular calcification. Mild mitral valve regurgitation. No evidence of mitral valve stenosis. Tricuspid Valve: The tricuspid valve is normal in structure. Tricuspid valve regurgitation is not demonstrated. No evidence of tricuspid stenosis. Aortic Valve: The aortic valve is normal in structure. Aortic valve regurgitation is not visualized. No aortic stenosis is present. Pulmonic Valve: The pulmonic valve was normal in structure. Pulmonic valve regurgitation is not visualized. No evidence of pulmonic stenosis. Aorta: The aortic root is normal in size and structure.  Venous: The inferior vena cava is normal in size with greater than 50% respiratory variability, suggesting right atrial pressure of 3 mmHg. IAS/Shunts: No atrial level shunt detected by color flow Doppler.  LEFT VENTRICLE PLAX 2D LVIDd:         5.50 cm  Diastology LVIDs:         3.70 cm  LV e' medial:    4.46 cm/s LV PW:         1.40 cm  LV E/e' medial:  17.6 LV IVS:        1.40 cm  LV e' lateral:   5.44 cm/s LVOT diam:     2.10 cm  LV E/e' lateral: 14.4 LVOT Area:     3.46 cm  LEFT ATRIUM         Index LA diam:  3.60 cm 1.40 cm/m   AORTA Ao Root diam: 3.20 cm MITRAL VALVE MV Area (PHT): 2.99 cm     SHUNTS MV Decel Time: 254 msec     Systemic Diam: 2.10 cm MV E velocity: 78.40 cm/s MV A velocity: 121.00 cm/s MV E/A ratio:  0.65 Armanda Magic MD Electronically signed by Armanda Magic MD Signature Date/Time: 12/19/2019/6:33:05 PM    Final     Micro Results     Recent Results (from the past 240 hour(s))  SARS Coronavirus 2 by RT PCR (hospital order, performed in [facility] hospital lab) Nasopharyngeal Nasopharyngeal Swab     Status: Abnormal   Collection Time: 12/19/19 10:28 AM   Specimen: Nasopharyngeal Swab  Result Value Ref Range Status   SARS Coronavirus 2 POSITIVE (A) NEGATIVE Final    Comment: RESULT CALLED TO, READ BACK BY AND VERIFIED WITH: B MICHELSON RN [id number] 12/19/19 A BROWNING (NOTE) SARS-CoV-2 target nucleic acids are DETECTED  SARS-CoV-2 RNA is generally detectable in upper respiratory specimens  during the acute phase of infection.  Positive results are indicative  of the presence of the identified virus, but do not rule out bacterial infection or co-infection with other pathogens not detected by the test.  Clinical correlation with patient history and  other diagnostic information is necessary to determine patient infection status.  The expected result is negative.  Fact Sheet for Patients:   BoilerBrush.com.cy   Fact Sheet for Healthcare Providers:     https://pope.com/    This test is not yet approved or cleared by the [place] FDA and  has been authorized for detection and/or diagnosis of SARS-CoV-2 by FDA under an Emergency Use Authorization (EUA).  This EUA will remain in effect (meaning thi s test can be used) for the duration of  the COVID-19 declaration under Section 564(b)(1) of the Act, 21 U.S.C. section 360-bbb-3(b)(1), unless the authorization is terminated or revoked sooner.  Performed at [facility], [address] [place], [place] [address]        Today   Subjective:   Cael Worth today reports he is feeling better, no nausea, no dizziness or lightheadedness, no dyspnea, no cough.     Objective:   Blood pressure (!) 178/80, pulse 82, temperature 98.1 F (36.7 C), temperature source Oral, resp. rate 15, height 6\' 4"  (1.93 m), weight 127 kg, SpO2 98 %.   Intake/Output Summary (Last 24 hours) at 12/22/2019 1100 Last data filed at 12/22/2019 0843 Gross per 24 hour  Intake 260 ml  Output 1680 ml  Net -1420 ml    Exam Awake Alert, Oriented x 3, sitting in the recliner, no new F.N deficits, Normal affect, right periorbital bruising from syncope on presentation. Symmetrical Chest wall movement, Good air movement bilaterally, CTAB RRR,No Gallops,Rubs or new Murmurs, No Parasternal Heave +ve B.Sounds, Abd Soft. No Cyanosis, Clubbing or edema, No new Rash or bruise  Data Review   CBC w Diff:  Lab Results  Component Value Date   WBC 3.3 (L) 12/21/2019   HGB 14.9 12/21/2019   HCT 47.3 12/21/2019   PLT 145 (L) 12/21/2019   LYMPHOPCT 19 12/21/2019   MONOPCT 13 12/21/2019   EOSPCT 0 12/21/2019   BASOPCT 0 12/21/2019    CMP:  Lab Results  Component Value Date   NA 136 12/21/2019   K 4.0 12/21/2019   CL 104 12/21/2019   CO2 20 (L) 12/21/2019   BUN 20 12/21/2019   CREATININE 1.16 12/21/2019  PROT 7.9 12/21/2019   ALBUMIN 3.4 (L) 12/21/2019   ALBUMIN 3,400  (L) 07/24/2015   BILITOT 0.4 12/21/2019   ALKPHOS 48 12/21/2019   AST 22 12/21/2019   ALT 18 12/21/2019  .   Total Time in preparing paper work, data evaluation and todays exam - 35 minutes  Huey Bienenstock M.D on 12/22/2019 at 11:00 AM  [facility]   Office  [phone]

## 2020-01-30 ENCOUNTER — Other Ambulatory Visit: Payer: Self-pay

## 2020-01-30 DIAGNOSIS — I6523 Occlusion and stenosis of bilateral carotid arteries: Secondary | ICD-10-CM

## 2020-02-02 ENCOUNTER — Ambulatory Visit (HOSPITAL_COMMUNITY)
Admission: RE | Admit: 2020-02-02 | Discharge: 2020-02-02 | Disposition: A | Discharge status: Home / Self Care | Payer: Medicare HMO | Source: Ambulatory Visit | Attending: Vascular Surgery | Admitting: Vascular Surgery

## 2020-02-02 ENCOUNTER — Encounter: Payer: Self-pay | Admitting: Vascular Surgery

## 2020-02-02 ENCOUNTER — Ambulatory Visit: Payer: Medicare HMO | Admitting: Vascular Surgery

## 2020-02-02 ENCOUNTER — Other Ambulatory Visit: Payer: Self-pay

## 2020-02-02 VITALS — BP 183/111 | HR 66 | Temp 97.6°F | Resp 20 | Ht 76.0 in | Wt 283.0 lb

## 2020-02-02 DIAGNOSIS — I6523 Occlusion and stenosis of bilateral carotid arteries: Secondary | ICD-10-CM | POA: Insufficient documentation

## 2020-02-02 NOTE — Progress Notes (Signed)
Referring Physician: Dr. Nehemiah Settle  Patient name: Alexander Gonzalez MRN: [id number] DOB: Aug 19, 1950 Sex: male  REASON FOR CONSULT: Carotid stenosis with syncope  HPI: Alexander Gonzalez is a 69 y.o. male, who had a syncopal episode approximately 1 month ago.  This was evaluated with a CT scan of the head which showed no stroke.  He had an MRI of the brain which showed occlusion of the left vertebral artery.  He also had a CT angio of the neck in 2017 which showed a 70% right internal carotid artery stenosis.  There is a 50% stenosis of the left internal carotid artery stenosis.  Patient also had a carotid angiogram performed in July 2017 which suggested 80 to 85% left internal carotid artery stenosis and 70% right internal carotid artery stenosis with essentially occluded left vertebral artery no basilar artery stenosis. States he has not had any cardiology evaluation for his syncopal episode. He states he did have a TIA several years ago where he had blindness in both eyes which was fairly transient. Patient is currently on Plavix and a statin.  Other medical problems include hypertension which has been stable.  Past Medical History:  Diagnosis Date  . Hyperlipidemia   . Hypertension   . Mini stroke (HCC) 1996  . Osteoarthritis    Past Surgical History:  Procedure Laterality Date  . IR GENERIC HISTORICAL  11/01/2015   IR RADIOLOGIST EVAL & MGMT 11/01/2015 [facility]-INTERV RAD  . KNEE SURGERY  1976   right  . KNEE SURGERY  1980'2   right  . REPLACEMENT TOTAL KNEE  1996   left    Family History  Problem Relation Age of Onset  . Cancer Mother        Deceased, 73  . Hypertension Mother   . Heart attack Father        Deceased  . Diabetes Brother        Deceased  . Autism Son   . Healthy Daughter     SOCIAL HISTORY: Social History   Socioeconomic History  . Marital status: Divorced    Spouse name: Not on file  . Number of children: Not on file  . Years of education: Not on  file  . Highest education level: Not on file  Occupational History  . Not on file  Tobacco Use  . Smoking status: Never Smoker  . Smokeless tobacco: Never Used  Vaping Use  . Vaping Use: Never used  Substance and Sexual Activity  . Alcohol use: Yes    Alcohol/week: 0.0 standard drinks    Comment: occas.  . Drug use: No  . Sexual activity: Yes  Other Topics Concern  . Not on file  Social History Narrative   Lives with wife and 5 kids in a 2 story home.  Retired Emergency planning/management officer in [place].     Education: 12th grade.   Social Determinants of Health   Financial Resource Strain:   . Difficulty of Paying Living Expenses: Not on file  Food Insecurity:   . Worried About Programme researcher, broadcasting/film/video in the Last Year: Not on file  . Ran Out of Food in the Last Year: Not on file  Transportation Needs:   . Lack of Transportation (Medical): Not on file  . Lack of Transportation (Non-Medical): Not on file  Physical Activity:   . Days of Exercise per Week: Not on file  . Minutes of Exercise per Session: Not on file  Stress:   .  Feeling of Stress : Not on file  Social Connections:   . Frequency of Communication with Friends and Family: Not on file  . Frequency of Social Gatherings with Friends and Family: Not on file  . Attends Religious Services: Not on file  . Active Member of Clubs or Organizations: Not on file  . Attends Banker Meetings: Not on file  . Marital Status: Not on file  Intimate Partner Violence:   . Fear of Current or Ex-Partner: Not on file  . Emotionally Abused: Not on file  . Physically Abused: Not on file  . Sexually Abused: Not on file    No Known Allergies  Current Outpatient Medications  Medication Sig Dispense Refill  . acetaminophen (TYLENOL) 325 MG tablet Take 2 tablets (650 mg total) by mouth every 6 (six) hours as needed for mild pain (or Fever >/= 101).    Marland Kitchen amLODipine (NORVASC) 10 MG tablet Take 1 tablet (10 mg total) by mouth daily. 30  tablet 3  . clopidogrel (PLAVIX) 75 MG tablet Take 75 mg by mouth daily.  0  . losartan (COZAAR) 100 MG tablet Take 1 tablet (100 mg total) by mouth daily. 30 tablet 0  . simvastatin (ZOCOR) 40 MG tablet Take 0.5 tablets (20 mg total) by mouth every evening. Take 20 mg -half a tablet daily (Patient taking differently: Take 40 mg by mouth daily. ) 30 tablet 3   No current facility-administered medications for this visit.    ROS:   General:  No weight loss, Fever, chills  HEENT: No recent headaches, no nasal bleeding, no visual changes, no sore throat  Neurologic: No dizziness, blackouts, seizures. No recent symptoms of stroke or mini- stroke. No recent episodes of slurred speech, or temporary blindness.  Cardiac: No recent episodes of chest pain/pressure, no shortness of breath at rest.  No shortness of breath with exertion.  Denies history of atrial fibrillation or irregular heartbeat  Vascular: No history of rest pain in feet.  No history of claudication.  No history of non-healing ulcer, No history of DVT   Pulmonary: No home oxygen, no productive cough, no hemoptysis,  No asthma or wheezing  Musculoskeletal:  [X]  Arthritis, [ ]  Low back pain,  [X]  Joint pain  Hematologic:No history of hypercoagulable state.  No history of easy bleeding.  No history of anemia  Gastrointestinal: No hematochezia or melena,  No gastroesophageal reflux, no trouble swallowing  Urinary: [ ]  chronic Kidney disease, [ ]  on HD - [ ]  MWF or [ ]  TTHS, [ ]  Burning with urination, [ ]  Frequent urination, [ ]  Difficulty urinating;   Skin: No rashes  Psychological: No history of anxiety,  No history of depression   Physical Examination  Vitals:   02/02/20 0959 02/02/20 1001  BP: (!) 173/94 (!) 183/111  Pulse: 66   Resp: 20   Temp: 97.6 F (36.4 C)   SpO2: 96%   Weight: 283 lb (128.4 kg)   Height: 6\' 4"  (1.93 m)     Body mass index is 34.45 kg/m.  General:  Alert and oriented, no acute  distress HEENT: Normal Neck: No JVD Cardiac: Regular Rate and Rhythm Abdomen: Soft, non-tender, non-distended, no mass, no scars Skin: No rash Extremity Pulses:  2+ radial, brachial, femoral, dorsalis pedis pulses bilaterally Musculoskeletal: No deformity or edema  Neurologic: Upper and lower extremity motor 5/5 and symmetric  DATA:  Patient had a carotid duplex exam today which showed 60 to 80% right internal carotid  artery stenosis 40 to 60% left internal carotid artery stenosis. I also reviewed the images from his previous CT scans and MRI and arteriogram.  ASSESSMENT: Syncopal episode with bilateral mild to moderate carotid stenosis. I do not believe that this is the etiology of his syncopal episode. Usually to cause syncope he would need greater than 80% narrowing of both carotid arteries and vertebral arteries.   PLAN: Patient will continue his Plavix and statin. He will have a follow-up carotid duplex exam in 6 months time and see our APP clinic. If he progresses to greater than 80% or develops localizing symptoms would consider carotid endarterectomy or carotid stenting.  Patient asked today whether or not it was okay and safe for him to drive. I am not sure that we know what the etiology for his syncopal episode is at this point. I will defer this to the discretion of Dr. Nehemiah Settle whether or not further work-up is being evaluated for his syncopal episode such as cardiac arrhythmia.   Fabienne Bruns, MD [facility] Office: [phone]

## 2020-02-09 ENCOUNTER — Telehealth (HOSPITAL_COMMUNITY): Payer: Self-pay | Admitting: Radiology

## 2020-02-09 NOTE — Telephone Encounter (Signed)
Called pt, left VM. Dr. Nehemiah Settle sent a referral to Dr. Corliss Skains for cerebral angiogram due to carotid artery stenosis. Pt to call back to schedule. JM

## 2020-02-10 ENCOUNTER — Other Ambulatory Visit (HOSPITAL_COMMUNITY): Payer: Self-pay | Admitting: Interventional Radiology

## 2020-02-10 DIAGNOSIS — I771 Stricture of artery: Secondary | ICD-10-CM

## 2020-03-08 ENCOUNTER — Encounter (HOSPITAL_COMMUNITY): Payer: Self-pay

## 2020-03-08 ENCOUNTER — Ambulatory Visit (HOSPITAL_COMMUNITY): Admission: RE | Admit: 2020-03-08 | Payer: Medicare HMO | Source: Ambulatory Visit

## 2020-07-05 ENCOUNTER — Telehealth (HOSPITAL_COMMUNITY): Payer: Self-pay

## 2020-07-05 NOTE — Telephone Encounter (Signed)
Called to schedule angiogram, no answer, left vm. AW 

## 2023-09-26 ENCOUNTER — Encounter (HOSPITAL_COMMUNITY): Payer: Self-pay | Admitting: Interventional Radiology
